# Patient Record
Sex: Male | Born: 1965
Health system: Southern US, Community
[De-identification: ages and names within clinical notes are randomized; demographics above are authoritative.]

## PROBLEM LIST (undated history)

## (undated) DIAGNOSIS — J45909 Unspecified asthma, uncomplicated: Secondary | ICD-10-CM

## (undated) DIAGNOSIS — K219 Gastro-esophageal reflux disease without esophagitis: Secondary | ICD-10-CM

## (undated) DIAGNOSIS — M419 Scoliosis, unspecified: Secondary | ICD-10-CM

## (undated) DIAGNOSIS — F319 Bipolar disorder, unspecified: Secondary | ICD-10-CM

## (undated) DIAGNOSIS — R7303 Prediabetes: Secondary | ICD-10-CM

## (undated) DIAGNOSIS — J449 Chronic obstructive pulmonary disease, unspecified: Secondary | ICD-10-CM

## (undated) DIAGNOSIS — A0472 Enterocolitis due to Clostridium difficile, not specified as recurrent: Secondary | ICD-10-CM

## (undated) DIAGNOSIS — J849 Interstitial pulmonary disease, unspecified: Secondary | ICD-10-CM

## (undated) DIAGNOSIS — J302 Other seasonal allergic rhinitis: Secondary | ICD-10-CM

## (undated) DIAGNOSIS — G4730 Sleep apnea, unspecified: Secondary | ICD-10-CM

## (undated) HISTORY — DX: Gastro-esophageal reflux disease without esophagitis: K21.9

## (undated) HISTORY — DX: Unspecified asthma, uncomplicated: J45.909

## (undated) HISTORY — DX: Enterocolitis due to Clostridium difficile, not specified as recurrent: A04.72

## (undated) HISTORY — PX: LUNG TRANSPLANT, DOUBLE: SHX704

## (undated) HISTORY — DX: Other seasonal allergic rhinitis: J30.2

## (undated) HISTORY — DX: Scoliosis, unspecified: M41.9

## (undated) HISTORY — DX: Chronic obstructive pulmonary disease, unspecified: J44.9

## (undated) HISTORY — DX: Sleep apnea, unspecified: G47.30

## (undated) HISTORY — PX: BACK SURGERY: SHX140

## (undated) HISTORY — DX: Interstitial pulmonary disease, unspecified: J84.9

## (undated) HISTORY — PX: LUNG SURGERY: SHX703

## (undated) HISTORY — PX: OTHER SURGICAL HISTORY: SHX169

## (undated) HISTORY — DX: Bipolar disorder, unspecified: F31.9

---

## 2013-01-06 ENCOUNTER — Other Ambulatory Visit: Payer: Self-pay | Admitting: Gastroenterology

## 2013-01-06 LAB — CLOSTRIDIUM DIFFICILE BY PCR

## 2013-01-08 ENCOUNTER — Ambulatory Visit: Payer: Self-pay | Admitting: Gastroenterology

## 2013-10-17 ENCOUNTER — Emergency Department: Payer: Self-pay | Admitting: Emergency Medicine

## 2013-10-17 LAB — COMPREHENSIVE METABOLIC PANEL
ALT: 36 U/L (ref 12–78)
Albumin: 3.7 g/dL (ref 3.4–5.0)
Alkaline Phosphatase: 92 U/L
Anion Gap: 7 (ref 7–16)
BUN: 10 mg/dL (ref 7–18)
Bilirubin,Total: 0.3 mg/dL (ref 0.2–1.0)
CALCIUM: 8.6 mg/dL (ref 8.5–10.1)
CHLORIDE: 107 mmol/L (ref 98–107)
CREATININE: 0.86 mg/dL (ref 0.60–1.30)
Co2: 26 mmol/L (ref 21–32)
EGFR (African American): >60
EGFR (Non-African Amer.): >60
Glucose: 124 mg/dL — ABNORMAL HIGH (ref 65–99)
OSMOLALITY: 280 (ref 275–301)
POTASSIUM: 4.2 mmol/L (ref 3.5–5.1)
SGOT(AST): 25 U/L (ref 15–37)
Sodium: 140 mmol/L (ref 136–145)
Total Protein: 7.6 g/dL (ref 6.4–8.2)

## 2013-10-17 LAB — CBC
HCT: 42.6 % (ref 40.0–52.0)
HGB: 14.2 g/dL (ref 13.0–18.0)
MCH: 30.9 pg (ref 26.0–34.0)
MCHC: 33.2 g/dL (ref 32.0–36.0)
MCV: 93 fL (ref 80–100)
Platelet: 191 10*3/uL (ref 150–440)
RBC: 4.58 10*6/uL (ref 4.40–5.90)
RDW: 13.2 % (ref 11.5–14.5)
WBC: 6 10*3/uL (ref 3.8–10.6)

## 2013-10-17 LAB — URINALYSIS, COMPLETE
BACTERIA: NONE SEEN
Bilirubin,UR: NEGATIVE
Blood: NEGATIVE
Glucose,UR: NEGATIVE mg/dL (ref 0–75)
Ketone: NEGATIVE
Leukocyte Esterase: NEGATIVE
Nitrite: NEGATIVE
PH: 5 (ref 4.5–8.0)
Protein: NEGATIVE
RBC,UR: 1 /HPF (ref 0–5)
Specific Gravity: 1.027 (ref 1.003–1.030)
Squamous Epithelial: NONE SEEN

## 2013-10-17 LAB — CK TOTAL AND CKMB (NOT AT ARMC)
CK, TOTAL: 192 U/L
CK-MB: 2.1 ng/mL (ref 0.5–3.6)

## 2013-10-17 LAB — TROPONIN I: Troponin-I: <0.02 ng/mL

## 2013-10-21 ENCOUNTER — Ambulatory Visit: Payer: Self-pay | Admitting: Cardiology

## 2013-11-05 ENCOUNTER — Ambulatory Visit: Payer: Self-pay | Admitting: Specialist

## 2013-11-09 ENCOUNTER — Ambulatory Visit: Payer: Self-pay | Admitting: Specialist

## 2013-12-17 ENCOUNTER — Ambulatory Visit: Payer: Self-pay | Admitting: Cardiothoracic Surgery

## 2013-12-17 LAB — COMPREHENSIVE METABOLIC PANEL
ALK PHOS: 97 U/L
ALT: 51 U/L
Albumin: 4 g/dL (ref 3.4–5.0)
Anion Gap: 7 (ref 7–16)
BUN: 14 mg/dL (ref 7–18)
Bilirubin,Total: 0.3 mg/dL (ref 0.2–1.0)
CO2: 30 mmol/L (ref 21–32)
Calcium, Total: 9.3 mg/dL (ref 8.5–10.1)
Chloride: 103 mmol/L (ref 98–107)
Creatinine: 0.94 mg/dL (ref 0.60–1.30)
EGFR (African American): >60
GLUCOSE: 117 mg/dL — AB (ref 65–99)
OSMOLALITY: 281 (ref 275–301)
Potassium: 4.7 mmol/L (ref 3.5–5.1)
SGOT(AST): 28 U/L (ref 15–37)
Sodium: 140 mmol/L (ref 136–145)
TOTAL PROTEIN: 7.9 g/dL (ref 6.4–8.2)

## 2013-12-17 LAB — CBC CANCER CENTER
Basophil #: 0 x10 3/mm (ref 0.0–0.1)
Basophil %: 0.8 %
EOS ABS: 0.1 x10 3/mm (ref 0.0–0.7)
Eosinophil %: 1.9 %
HCT: 42.6 % (ref 40.0–52.0)
HGB: 14.2 g/dL (ref 13.0–18.0)
LYMPHS PCT: 38.3 %
Lymphocyte #: 2 x10 3/mm (ref 1.0–3.6)
MCH: 31 pg (ref 26.0–34.0)
MCHC: 33.4 g/dL (ref 32.0–36.0)
MCV: 93 fL (ref 80–100)
Monocyte #: 0.4 x10 3/mm (ref 0.2–1.0)
Monocyte %: 6.9 %
NEUTROS ABS: 2.7 x10 3/mm (ref 1.4–6.5)
NEUTROS PCT: 52.1 %
Platelet: 203 x10 3/mm (ref 150–440)
RBC: 4.6 10*6/uL (ref 4.40–5.90)
RDW: 12.9 % (ref 11.5–14.5)
WBC: 5.2 x10 3/mm (ref 3.8–10.6)

## 2013-12-17 LAB — PROTIME-INR
INR: 1
Prothrombin Time: 12.6 secs (ref 11.5–14.7)

## 2013-12-17 LAB — APTT: ACTIVATED PTT: 30.3 s (ref 23.6–35.9)

## 2013-12-21 ENCOUNTER — Ambulatory Visit: Payer: Self-pay

## 2013-12-28 ENCOUNTER — Inpatient Hospital Stay: Payer: Self-pay

## 2013-12-28 LAB — DRUG SCREEN, URINE

## 2013-12-29 LAB — CBC WITH DIFFERENTIAL/PLATELET
Basophil #: 0 10*3/uL (ref 0.0–0.1)
Basophil %: 0.2 %
Eosinophil #: 0 10*3/uL (ref 0.0–0.7)
Eosinophil %: 0.1 %
HCT: 40.8 % (ref 40.0–52.0)
HGB: 13.5 g/dL (ref 13.0–18.0)
Lymphocyte #: 1.6 10*3/uL (ref 1.0–3.6)
Lymphocyte %: 15.2 %
MCH: 30.9 pg (ref 26.0–34.0)
MCHC: 33.1 g/dL (ref 32.0–36.0)
MCV: 93 fL (ref 80–100)
Monocyte #: 0.9 x10 3/mm (ref 0.2–1.0)
Monocyte %: 8.6 %
NEUTROS ABS: 8 10*3/uL — AB (ref 1.4–6.5)
Neutrophil %: 75.9 %
PLATELETS: 199 10*3/uL (ref 150–440)
RBC: 4.37 10*6/uL — ABNORMAL LOW (ref 4.40–5.90)
RDW: 13.3 % (ref 11.5–14.5)
WBC: 10.6 10*3/uL (ref 3.8–10.6)

## 2013-12-29 LAB — BASIC METABOLIC PANEL
ANION GAP: 5 — AB (ref 7–16)
BUN: 10 mg/dL (ref 7–18)
CHLORIDE: 106 mmol/L (ref 98–107)
CO2: 29 mmol/L (ref 21–32)
CREATININE: 0.69 mg/dL (ref 0.60–1.30)
Calcium, Total: 8 mg/dL — ABNORMAL LOW (ref 8.5–10.1)
EGFR (Non-African Amer.): >60
GLUCOSE: 121 mg/dL — AB (ref 65–99)
Osmolality: 280 (ref 275–301)
POTASSIUM: 4.1 mmol/L (ref 3.5–5.1)
Sodium: 140 mmol/L (ref 136–145)

## 2013-12-31 ENCOUNTER — Ambulatory Visit: Payer: Self-pay | Admitting: Cardiothoracic Surgery

## 2013-12-31 LAB — PATHOLOGY REPORT

## 2014-01-31 ENCOUNTER — Ambulatory Visit: Payer: Self-pay | Admitting: Cardiothoracic Surgery

## 2014-02-17 ENCOUNTER — Encounter: Payer: Self-pay | Admitting: Pulmonary Disease

## 2014-02-17 ENCOUNTER — Ambulatory Visit (INDEPENDENT_AMBULATORY_CARE_PROVIDER_SITE_OTHER): Payer: BC Managed Care – PPO | Admitting: Pulmonary Disease

## 2014-02-17 ENCOUNTER — Encounter (INDEPENDENT_AMBULATORY_CARE_PROVIDER_SITE_OTHER): Payer: Self-pay

## 2014-02-17 VITALS — BP 134/82 | HR 80 | Ht 73.0 in | Wt 291.0 lb

## 2014-02-17 DIAGNOSIS — J84112 Idiopathic pulmonary fibrosis: Secondary | ICD-10-CM

## 2014-02-17 DIAGNOSIS — J8410 Pulmonary fibrosis, unspecified: Secondary | ICD-10-CM

## 2014-02-17 LAB — RHEUMATOID FACTOR: Rhuematoid fact SerPl-aCnc: <10 IU/mL (ref <=14)

## 2014-02-17 NOTE — Progress Notes (Signed)
   Subjective:    Patient ID: Kevin Boyd, male    DOB: Aug 23, 1965, 48 y.o.   MRN: [id number]  HPI    Review of Systems  Constitutional: Negative for fever and unexpected weight change.  HENT: Positive for postnasal drip and sinus pressure. Negative for congestion, dental problem, ear pain, nosebleeds, rhinorrhea, sneezing, sore throat and trouble swallowing.   Eyes: Negative for redness and itching.  Respiratory: Positive for shortness of breath. Negative for cough, chest tightness and wheezing.   Cardiovascular: Negative for palpitations and leg swelling.  Gastrointestinal: Negative for nausea and vomiting.  Genitourinary: Negative for dysuria.  Musculoskeletal: Negative for joint swelling.  Skin: Negative for rash.  Neurological: Negative for headaches.  Hematological: Does not bruise/bleed easily.  Psychiatric/Behavioral: Negative for dysphoric mood. The patient is not nervous/anxious.        Objective:   Physical Exam        Assessment & Plan:

## 2014-02-17 NOTE — Patient Instructions (Signed)
We will see you back in 2-3 weeks to go over the lab work I will call [facility] to discuss the familial lung fibrosis study

## 2014-02-17 NOTE — Progress Notes (Signed)
Subjective:    Patient ID: Kevin Boyd, male    DOB: 11-19-1965, 48 y.o.   MRN: [id number]  HPI  Kevin Boyd is here to see me because he was recently diagnosed with interstitial fibrosis.  He underwent a biopsy this year and was found to have a UIP.   He says that he had an ED visit a few months back one night when he felt like he was short of breath and he felt like he was having a heart attack.  He had an abnormal chest X-ray and he followed up with cardiology.  He had a heart catheterization that was normal.  He was then referred to pulmonary at [facility] because of the abnormal CXR.  He had a CT chest that showed pulmonary fibrosis and a nodule.  The nodule grew in the period of just one month and he underwent an open lung biopsy that showed no malignancy but UIP.  He had been experiencing several weeks to months of chest pain prior to his original presentation to the ER that night.  He had been experiencing intermittent arm numbness and an abnormal feeling in the chest.  He has been experiencing some dyspnea since 9/28, only occasionally prior to that.  He said that the dyspnea never bothered him prior to the surgery, but unfortunately since surgery he has been getting more dyspnic.  He does not cough.  He does clear his throat a lot because of phlegm in the back of his throat.  He does not produces mucus from his lungs regularly. He does experiencing dyspnea when climbing a hill or up a flight of stairs.  He has gained about 120 pounds in the last 17 years.     He was told years ago that he had asthma and COPD about 9 years ago after a case of bad pneumonia.  He was told at that time that he had fibrosis.   He has had lung function testing and a 6 min walk at the [facility].    His mother and his grandmother (mother's side).  His mother died in 2003 and was cared [facility]. She was told that he had a genetic problem.   He does not have problems swallowing.  No  fevers, no joint swelling, no rash. He has dry mouth all the time and sometimes has dry eyes.  He has never been told that he had a connective tissue disease.  He was adopted.  He knows that his mother had Sjogren's disease.    He has CPAP but he doesn't use it regularly every night.  He smoked 2 ppd for 30 years, quit in 2015.  He works in [employer] and has for years. 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  There is a wood stove in the house.    Past Medical History  Diagnosis Date  . Sleep apnea   . Interstitial lung disease   . Bipolar 1 disorder   . GERD (gastroesophageal reflux disease)   . Seasonal allergies   . Scoliosis   . COPD (chronic obstructive pulmonary disease)   . Asthma   . C. difficile diarrhea      Family History  Problem Relation Age of Onset  . Pulmonary fibrosis Mother   . Sjogren's syndrome Mother   . Pulmonary fibrosis Maternal Grandmother      History   Social History  . Marital Status: Single    Spouse Name: N/A  Number of Children: N/A  . Years of Education: N/A   Occupational History  . Not on file.   Social History Main Topics  . Smoking status: Former Smoker -- 2.00 packs/day for 30 years    Types: Cigarettes    Quit date: 12/28/2013  . Smokeless tobacco: Former [employer]  . Alcohol Use: Not on file  . Drug Use: Not on file  . Sexual Activity: Not on file   Other Topics Concern  . Not on file   Social History Narrative  . No narrative on file     No Known Allergies   No outpatient prescriptions prior to visit.   No facility-administered medications prior to visit.      Review of Systems  Constitutional: Negative for fever, chills, activity change and appetite change.  HENT: Negative for congestion, ear pain, hearing loss, postnasal drip, rhinorrhea, sinus pressure and sneezing.        Dry mouth  Eyes: Negative for redness, itching and visual disturbance.       Dry eyes  Respiratory: Positive  for cough and shortness of breath. Negative for chest tightness and wheezing.   Cardiovascular: Negative for chest pain, palpitations and leg swelling.  Gastrointestinal: Negative for nausea, vomiting, abdominal pain, diarrhea, constipation, blood in stool and abdominal distention.  Musculoskeletal: Negative for myalgias, joint swelling, arthralgias, gait problem, neck pain and neck stiffness.  Skin: Negative for rash.  Neurological: Negative for dizziness, light-headedness, numbness and headaches.  Hematological: Does not bruise/bleed easily.  Psychiatric/Behavioral: Negative for confusion and dysphoric mood.       Objective:   Physical Exam Filed Vitals:   02/17/14 1512  BP: 134/82  Pulse: 80  Height: 6\' 1"  (1.854 m)  Weight: 291 lb (131.997 kg)  SpO2: 96%  RA  Gen: well appearing, no acute distress HEENT: NCAT, PERRL, EOMi, OP clear, neck supple without masses PULM: few crackles left base CV: RRR, no mgr, no JVD AB: BS+, soft, nontender, no hsm Ext: warm, no edema, no clubbing, no cyanosis Derm: no rash or skin breakdown Neuro: A&Ox4, CN II-XII intact, strength 5/5 in all 4 extremities  August 2015 CT chest> There is groundglass opacification, scattered intralobular septal thickening, traction bronchiectasis and peripheral based honeycombing. The majority of the interstitial changes are peripheral based. There does not appear to be a craniocaudal gradient  12/28/2013 open lung biopsy: pulmonary fibrosis, severe, 1 sections show honeycombing, fibroblastic foci and overall variegated appearance with subpleural accentuation fibrosis, most consistent with usual interstitial pneumonitis. Granulomas are not seen.     Assessment & Plan:   UIP (usual interstitial pneumonitis) Kevin Boyd has a CT chest which is consistent with UIP and his open lung biopsy was also consistent with that. UIP can be seen with a variety of conditions including idiopathic pulmonary fibrosis, familial  pulmonary fibrosis, and connective tissue disease.  Interestingly he has a lengthy family history of women and his family who have died early from pulmonary fibrosis. His [facility] and died there in 2003. His grandmother (maternal) also died early of pulmonary fibrosis. So I am concerned that this is a familial pulmonary fibrosis case.  Apparently his mother had a history of Sjogren's disease and considering the fact that [name] has dry eyes and dry mouth raises my concern for this possibility.  I explained to him that I am reluctant to treat this condition as IPF at this point for a few reasons: #1 he has known about this for  least 9 years which is very atypical for IPF. #2 he may have an underlying connective tissue disease in which case we would treat UIP differently than IPF.   Plan: -obtain serologic workup for connective tissue diseases -I will contact my colleagues at the [facility] interstitial lung disease clinic to see if they have any protocols ongoing for familial usual interstitial pneumonitis. -Followup 2-3 weeks to discuss whether or not he should be chewed with antibiotic therapy    Updated Medication List Outpatient Encounter Prescriptions as of 02/17/2014  Medication Sig  . albuterol (PROVENTIL HFA;VENTOLIN HFA) 108 (90 BASE) MCG/ACT inhaler Inhale 2 puffs into the lungs every 6 (six) hours as needed for wheezing or shortness of breath.  Marland Kitchen aspirin EC 81 MG tablet Take 81 mg by mouth daily.  Marland Kitchen Bioflavonoid Products (ESTER C PO) Take 1 tablet by mouth daily.  . carbamazepine (TEGRETOL XR) 100 MG 12 hr tablet Take 100 mg by mouth 2 (two) times daily. 1 tab qam, 2 tabs qhs  . cetirizine (ZYRTEC) 10 MG tablet Take 10 mg by mouth daily.  . Cholecalciferol (VITAMIN D-3) 5000 UNITS TABS Take 1 tablet by mouth daily.  . clonazePAM (KLONOPIN) 0.5 MG tablet Take 0.5 mg by mouth 2 (two) times daily as needed for anxiety.  Marland Kitchen esomeprazole (NEXIUM) 40 MG  capsule Take 40 mg by mouth daily at 12 noon.  . ferrous gluconate (FERGON) 324 MG tablet Take 324 mg by mouth daily with breakfast.  . Fluticasone-Salmeterol (ADVAIR) 500-50 MCG/DOSE AEPB Inhale 1 puff into the lungs 2 (two) times daily.  Marland Kitchen lamoTRIgine (LAMICTAL) 200 MG tablet Take 200 mg by mouth 2 (two) times daily.  . Probiotic Product (PROBIOTIC DAILY PO) Take 1 tablet by mouth daily.

## 2014-02-17 NOTE — Assessment & Plan Note (Signed)
Mr. Kevin Boyd has a CT chest which is consistent with UIP and his open lung biopsy was also consistent with that. UIP can be seen with a variety of conditions including idiopathic pulmonary fibrosis, familial pulmonary fibrosis, and connective tissue disease.  Interestingly he has a lengthy family history of women and his family who have died early from pulmonary fibrosis. His [facility] and died there in 2003. His grandmother (maternal) also died early of pulmonary fibrosis. So I am concerned that this is a familial pulmonary fibrosis case.  Apparently his mother had a history of Sjogren's disease and considering the fact that Kevin Boyd has dry eyes and dry mouth raises my concern for this possibility.  I explained to him that I am reluctant to treat this condition as IPF at this point for a few reasons: #1 he has known about this for least 9 years which is very atypical for IPF. #2 he may have an underlying connective tissue disease in which case we would treat UIP differently than IPF.   Plan: -obtain serologic workup for connective tissue diseases -I will contact my colleagues at the [facility] interstitial lung disease clinic to see if they have any protocols ongoing for familial usual interstitial pneumonitis. -Followup 2-3 weeks to discuss whether or not he should be chewed with antibiotic therapy

## 2014-02-18 LAB — SJOGRENS SYNDROME-B EXTRACTABLE NUCLEAR ANTIBODY: SSB (La) (ENA) Antibody, IgG: <1

## 2014-02-18 LAB — ANTI-JO 1 ANTIBODY, IGG: Anti JO-1: <0.2 AI (ref 0.0–0.9)

## 2014-02-18 LAB — ANTI-SCLERODERMA ANTIBODY: SCLERODERMA (SCL-70) (ENA) ANTIBODY, IGG: NEGATIVE

## 2014-02-18 LAB — ANA: ANA: NEGATIVE

## 2014-02-18 LAB — SJOGRENS SYNDROME-A EXTRACTABLE NUCLEAR ANTIBODY
SSA (RO) (ENA) ANTIBODY, IGG: NEGATIVE
SSA (Ro) (ENA) Antibody, IgG: <1

## 2014-02-18 LAB — CENTROMERE ANTIBODIES: Centromere Ab Screen: <1

## 2014-02-18 LAB — SEDIMENTATION RATE: Sed Rate: 44 mm/hr — ABNORMAL HIGH (ref 0–22)

## 2014-02-18 LAB — C-REACTIVE PROTEIN: CRP: 2.6 mg/dL (ref 0.5–20.0)

## 2014-02-19 LAB — ALDOLASE: Aldolase: 7.8 U/L (ref <=8.1)

## 2014-02-22 ENCOUNTER — Telehealth: Payer: Self-pay | Admitting: Pulmonary Disease

## 2014-02-22 NOTE — Telephone Encounter (Signed)
[facility] 4 pages to Dr. Kendrick FriesMcQuaid

## 2014-02-22 NOTE — Progress Notes (Signed)
Quick Note:  Pt aware of results and recs ______ 

## 2014-02-23 LAB — HYPERSENSITIVITY PNUEMONITIS PROFILE

## 2014-03-02 ENCOUNTER — Ambulatory Visit: Payer: Self-pay | Admitting: Cardiothoracic Surgery

## 2014-03-04 ENCOUNTER — Encounter: Payer: Self-pay | Admitting: Pulmonary Disease

## 2014-03-04 ENCOUNTER — Ambulatory Visit (INDEPENDENT_AMBULATORY_CARE_PROVIDER_SITE_OTHER): Payer: BC Managed Care – PPO | Admitting: Pulmonary Disease

## 2014-03-04 ENCOUNTER — Other Ambulatory Visit (INDEPENDENT_AMBULATORY_CARE_PROVIDER_SITE_OTHER): Payer: BC Managed Care – PPO

## 2014-03-04 VITALS — BP 148/86 | HR 78 | Ht 73.0 in | Wt 265.0 lb

## 2014-03-04 DIAGNOSIS — J84112 Idiopathic pulmonary fibrosis: Secondary | ICD-10-CM

## 2014-03-04 DIAGNOSIS — R0602 Shortness of breath: Secondary | ICD-10-CM

## 2014-03-04 DIAGNOSIS — R911 Solitary pulmonary nodule: Secondary | ICD-10-CM

## 2014-03-04 LAB — HEPATIC FUNCTION PANEL
ALT: 36 U/L (ref 0–53)
AST: 30 U/L (ref 0–37)
Albumin: 4.3 g/dL (ref 3.5–5.2)
Alkaline Phosphatase: 92 U/L (ref 39–117)
Bilirubin, Direct: 0.1 mg/dL (ref 0.0–0.3)
Total Bilirubin: 0.5 mg/dL (ref 0.2–1.2)
Total Protein: 7.8 g/dL (ref 6.0–8.3)

## 2014-03-04 NOTE — Assessment & Plan Note (Signed)
This was biopsied by Dr. Thelma Bargeaks and was found to be benign. Follow-up with Dr. Thelma Bargeaks.

## 2014-03-04 NOTE — Assessment & Plan Note (Addendum)
Based on his biopsy result which showed usual interstitial pneumonitis, and the fact that he has no underlying connective tissue disease, Mr. Kevin Boyd has idiopathic pulmonary fibrosis. I explained to him today that unfortunately there is no cure for this condition. We talked about the benefits of using anti-fibrotic therapy with either Esbriet or Ofev.   I explained to him the side effects of the shins and the fact that they only slow the progression of the disease they do not cure it.    I also explained to him that our center participates in multiple clinical trials for idiopathic pulmonary fibrosis. He is interested in participating in clinical trials for this condition. I also explained to him that in the long-term he may ultimately need a lung transplant considering his young age. I told him that the best thing he can do to help himself at this point is to exercise regularly and try to lose as much weight as possible.  He may be interested in going to [facility] interstitial lung disease clinic so that he can participate in an observational study of patients with familial idiopathic pulmonary fibrosis.  Plan: -Pulmonary rehabilitation referral -Start Esbriet -obtain PFT/6MW -[facility] the familial IPF study -consider clinical trial referral

## 2014-03-04 NOTE — Progress Notes (Signed)
Subjective:    Patient ID: Kevin Boyd, male    DOB: April 29, 1965, 48 y.o.   MRN: [id number]  Synopsis: First evaluated by [facility] in 2015 for usual interstitial pneumonitis found on a biopsy. He has a family history significant for pulmonary fibrosis in that his mother died and he has a brother with the disease. Serology panel in 2015 was negative for connective tissue disease.  Diagnosed with IPF  HPI Chief Complaint  Patient presents with  . Follow-up    Pt c/o sob with exertion, mostly nonprod cough.  Review labs.     03/04/2014 ROV > Kevin Boyd says that he has noticed that he has a hard time catching his breath when he is carrying objects at work.  He definitely feels more short or breath. He has been coughing up mucus, sometime clear mucus.  No blood, no chest pain aside from the nerve pain.  He is not smoking.  His weight has been stable.  He is not exercising regulalry.   Past Medical History  Diagnosis Date  . Sleep apnea   . Interstitial lung disease   . Bipolar 1 disorder   . GERD (gastroesophageal reflux disease)   . Seasonal allergies   . Scoliosis   . COPD (chronic obstructive pulmonary disease)   . Asthma   . C. difficile diarrhea       Review of Systems  Constitutional: Negative for fever and chills.  HENT: Negative for postnasal drip, rhinorrhea and sinus pressure.   Respiratory: Positive for cough and shortness of breath. Negative for wheezing.   Cardiovascular: Negative for chest pain, palpitations and leg swelling.       Objective:   Physical Exam Filed Vitals:   03/04/14 1347  BP: 148/86  Pulse: 78  Height: 6' 1"  (1.854 m)  Weight: 265 lb (120.203 kg)  SpO2: 97%  RA  Gen: well appearing, no acute distress HEENT: NCAT, EOMi, OP clear,  PULM: CTA B CV: RRR, no mgr, no JVD AB: BS+, soft, nontender,  Ext: warm, no edema, no clubbing, no cyanosis Derm: no rash or skin breakdown Neuro: A&Ox4, CN II-XII intact,   Records from [place] were  reviewed, he had a chest x-ray in 2004 which showed pneumonia, however these findings resolved within one month on a follow-up x-ray Serology was sent in November 2015> ANA, rheumatoid factor, CCP, anti-SCL 70, SSA/SSB, anti-Jo-1, aldolase all negative, ESR slightly elevated at 44 11/09/2013 CT chest images reviewed again today  August 2015 CT chest images reviewed> Consistent with UIP; There is groundglass opacification, scattered intralobular septal thickening, traction bronchiectasis and peripheral based honeycombing. The majority of the interstitial changes are peripheral based. There does not appear to be a craniocaudal gradient; 63m nodule RLL  12/28/2013 open lung biopsy: pulmonary fibrosis, severe, 1 sections show honeycombing, fibroblastic foci and overall variegated appearance with subpleural accentuation fibrosis, most consistent with usual interstitial pneumonitis. Granulomas are not seen.      Assessment & Plan:   IPF (idiopathic pulmonary fibrosis) Based on his biopsy result which showed usual interstitial pneumonitis, and the fact that he has no underlying connective tissue disease, Mr. HEckardthas idiopathic pulmonary fibrosis. I explained to him today that unfortunately there is no cure for this condition. We talked about the benefits of using anti-fibrotic therapy with either Esbriet or Ofev.   I explained to him the side effects of the shins and the fact that they only slow the progression of the disease they do not cure it.  I also explained to him that our center participates in multiple clinical trials for idiopathic pulmonary fibrosis. He is interested in participating in clinical trials for this condition. I also explained to him that in the long-term he may ultimately need a lung transplant considering his young age. I told him that the best thing he can do to help himself at this point is to exercise regularly and try to lose as much weight as possible.  He may be  interested in going to [facility] interstitial lung disease clinic so that he can participate in an observational study of patients with familial idiopathic pulmonary fibrosis.  Plan: -Pulmonary rehabilitation referral -Start Esbriet -obtain PFT/6MW -He will consider evaluation at [facility] for the familial IPF study -consider clinical trial referral  Shortness of breath This problem is multifactorial and related to his idiopathic pulmonary fibrosis, obesity, and deconditioning. I think he is also more deconditioned than normal after his recent surgery.  Plan: -Pulmonary rehabilitation referral -Weight loss advised  Solitary pulmonary nodule This was biopsied by Dr. Genevive Bi and was found to be benign. Follow-up with Dr. Genevive Bi.    Updated Medication List Outpatient Encounter Prescriptions as of 03/04/2014  Medication Sig  . albuterol (PROVENTIL HFA;VENTOLIN HFA) 108 (90 BASE) MCG/ACT inhaler Inhale 2 puffs into the lungs every 6 (six) hours as needed for wheezing or shortness of breath.  Marland Kitchen aspirin EC 81 MG tablet Take 81 mg by mouth daily.  Marland Kitchen Bioflavonoid Products (ESTER C PO) Take 1 tablet by mouth daily.  . carbamazepine (TEGRETOL XR) 100 MG 12 hr tablet Take 100 mg by mouth 2 (two) times daily. 1 tab qam, 2 tabs qhs  . cetirizine (ZYRTEC) 10 MG tablet Take 10 mg by mouth daily.  . Cholecalciferol (VITAMIN D-3) 5000 UNITS TABS Take 1 tablet by mouth daily.  . clonazePAM (KLONOPIN) 0.5 MG tablet Take 0.5 mg by mouth 2 (two) times daily as needed for anxiety.  Marland Kitchen esomeprazole (NEXIUM) 40 MG capsule Take 40 mg by mouth daily at 12 noon.  . ferrous gluconate (FERGON) 324 MG tablet Take 324 mg by mouth daily with breakfast.  . Fluticasone-Salmeterol (ADVAIR) 500-50 MCG/DOSE AEPB Inhale 1 puff into the lungs 2 (two) times daily.  Marland Kitchen lamoTRIgine (LAMICTAL) 200 MG tablet Take 200 mg by mouth 2 (two) times daily.  . Probiotic Product (PROBIOTIC DAILY PO) Take 1 tablet by mouth daily.

## 2014-03-04 NOTE — Patient Instructions (Signed)
We will refer [facility] to [facility] pulmonary rehab We will fill out the application for Esbriet We will arrange a pulmonary funciton test at [facility] WE will see you back in 2 months or sooner if needed

## 2014-03-04 NOTE — Assessment & Plan Note (Signed)
This problem is multifactorial and related to his idiopathic pulmonary fibrosis, obesity, and deconditioning. I think he is also more deconditioned than normal after his recent surgery.  Plan: -Pulmonary rehabilitation referral -Weight loss advised

## 2014-03-15 ENCOUNTER — Institutional Professional Consult (permissible substitution): Payer: Self-pay | Admitting: Pulmonary Disease

## 2014-03-15 ENCOUNTER — Telehealth: Payer: Self-pay | Admitting: Pulmonary Disease

## 2014-03-15 NOTE — Progress Notes (Signed)
Quick Note:  lmtcb X1 ______ 

## 2014-03-15 NOTE — Telephone Encounter (Signed)
Results have been explained to patient, pt expressed understanding. Nothing further needed.  Notes Recorded by Lupita Leashouglas B McQuaid, MD on 03/15/2014 at 3:20 PM A, Please let him know that this was normal Thanks B  Nothing further needed.

## 2014-03-22 ENCOUNTER — Ambulatory Visit: Payer: Self-pay | Admitting: Pulmonary Disease

## 2014-03-22 LAB — PULMONARY FUNCTION TEST

## 2014-03-30 ENCOUNTER — Telehealth: Payer: Self-pay | Admitting: Pulmonary Disease

## 2014-03-30 NOTE — Telephone Encounter (Addendum)
Pt states that he dropped off patient assistance paperwork for Esbriet and has not heard anything regarding the status. Dropped them off at [facility] about 2 weeks ago -  Given to BQ nurse.   Pt states that he had his PFT done x 1 week ago and was wanting to know the results.   Please advise Ashley on Esbriet.  Please advise Dr McQuaid on PFT results.  Thanks.  

## 2014-03-30 NOTE — Telephone Encounter (Signed)
I've received esbriet forms from Kevin Boyd, filled out and only needing BQ's signature and recs on med titration before faxing to esbriet.  I also have pft results from [facility] in BQ's look-at folder.

## 2014-03-30 NOTE — Telephone Encounter (Signed)
Pt states that he dropped off patient assistance paperwork for Esbriet and has not heard anything regarding the status. Dropped them off at [facility] about 2 weeks ago -  Given to BQ nurse.   Pt states that he had his PFT done x 1 week ago and was wanting to know the results.   Please advise Morrie Sheldonshley on Esbriet.  Please advise Dr Kendrick FriesMcQuaid on PFT results.  Thanks.

## 2014-03-30 NOTE — Telephone Encounter (Signed)
Duplicate message--closed in error. See open message dated 03/30/14

## 2014-03-30 NOTE — Telephone Encounter (Signed)
Pt returning call.Kevin Boyd ° °

## 2014-03-30 NOTE — Telephone Encounter (Signed)
Pt wanted clarification on Esbriet process. Nothing further needed. Will send to [place] and GeorgianaMcQuaid to address.

## 2014-04-05 NOTE — Telephone Encounter (Signed)
Will sign after I pick up today

## 2014-04-06 ENCOUNTER — Ambulatory Visit (INDEPENDENT_AMBULATORY_CARE_PROVIDER_SITE_OTHER)
Admission: RE | Admit: 2014-04-06 | Discharge: 2014-04-06 | Disposition: A | Discharge status: Home / Self Care | Payer: BLUE CROSS/BLUE SHIELD | Source: Ambulatory Visit | Attending: Pulmonary Disease | Admitting: Pulmonary Disease

## 2014-04-06 ENCOUNTER — Ambulatory Visit (INDEPENDENT_AMBULATORY_CARE_PROVIDER_SITE_OTHER): Payer: BLUE CROSS/BLUE SHIELD | Admitting: Pulmonary Disease

## 2014-04-06 ENCOUNTER — Encounter: Payer: Self-pay | Admitting: Pulmonary Disease

## 2014-04-06 VITALS — BP 138/72 | HR 96 | Ht 73.0 in | Wt 290.0 lb

## 2014-04-06 DIAGNOSIS — J84112 Idiopathic pulmonary fibrosis: Secondary | ICD-10-CM

## 2014-04-06 DIAGNOSIS — R0602 Shortness of breath: Secondary | ICD-10-CM

## 2014-04-06 DIAGNOSIS — R079 Chest pain, unspecified: Secondary | ICD-10-CM | POA: Insufficient documentation

## 2014-04-06 MED ORDER — GABAPENTIN 100 MG PO CAPS: ORAL_CAPSULE | ORAL | Status: DC

## 2014-04-06 MED ORDER — DOXYCYCLINE HYCLATE 50 MG PO CAPS: 50.0000 mg | ORAL_CAPSULE | Freq: Two times a day (BID) | ORAL | Status: DC

## 2014-04-06 NOTE — Telephone Encounter (Signed)
Pt aware of results.  Nothing further needed.  

## 2014-04-06 NOTE — Assessment & Plan Note (Addendum)
He has pleuritic chest pain over the scar from surgery. Is very well healed and appearance and does not appear to show signs of infection. I think this is a neuropathic pain. It may in fact be causing him to splint somewhat and contributing to his shortness of breath.  Plan: -Start gabapentin to treat neuropathic pain -may consider nerve block if no improvement

## 2014-04-06 NOTE — Assessment & Plan Note (Signed)
As detailed above, I do worry that his pulmonary fibrosis may be progressing a bit more rapidly than expected. However, I need to rule out something like a pleural effusion or bronchitis which can also be contributing.  Plan: -Start Esbriet as soon as we have Therapist, occupationalinsurance approval, he was again educated today that this does not improve symptoms but only slows the progression -We will look for clinical trials for IPF in which she can participate -Again today I educated him that his primary goal in life at this point should be to exercise regularly and lose weight to make himself an ideal candidate for lung transplantation as this will certainly be needed in the future. At this point he does not need a lung transplant because he does not need oxygen therapy and his symptoms are not severe enough.

## 2014-04-06 NOTE — Progress Notes (Signed)
Subjective:    Patient ID: Kevin Boyd, male    DOB: [birth year], 49 y.o.   MRN: [id number]  Synopsis: First evaluated by [facility] in 2015 for usual interstitial pneumonitis found on a biopsy. He has a family history significant for pulmonary fibrosis in that his mother died and he has a brother with the disease. Serology panel in 2015 was negative for connective tissue disease.  Diagnosed with IPF.  09/2002 [place] CXR showed infiltrates transiently that cleared on a follow up film one month later 01/2014 ANA, RF, SCL-70, SSA/SSB, Aldolase, Anti-Jo-1, centromere all neg August 2015 CT chest> There is groundglass opacification, scattered intralobular septal thickening, traction bronchiectasis and peripheral based honeycombing. The majority of the interstitial changes are peripheral based. There does not appear to be a craniocaudal gradient 12/28/2013 open lung biopsy: pulmonary fibrosis, severe, 1 sections show honeycombing, fibroblastic foci and overall variegated appearance with subpleural accentuation fibrosis, most consistent with usual interstitial pneumonitis. Granulomas are not seen. 03/22/2014 PFT> Ratio 82%, FEV1 2.91L (74% pred, 8% pred), TLCO 4.67L (62% pred), DLCO 35.4 (45% pred)   HPI Chief Complaint  Patient presents with  . Acute Visit    pt c/o increased sob with exertion X1 month.  Pt has sometimes prod cough with green mucus, some sinus congestion.      [name] has been feeling a lot more short of breath since the last visit.  He says that for the last month he has had a steady decline in his dyspnea.  He says that specifically if he bends over he can't breathe, he can't carry anything of any weight that would normally be easy.  He still works despite this.  He has been having pain in his chest, and has been feeling liek there is mucus in his thraot.  He rarely produces white to green mucus, but this hasn't been a persistent problem.  Yesterday he had a low grade temp  with aches and pains throughout his body.  He says that the chest comes and goes, and has both a sharp pain which he attributes to his recent open lung biopsy. However in addition to this he has been experiencing a steady dull pressure in the center of his chest and to the right chest.  He also has a severe pain when he sneezes or picks up something heavy.  Two days ago he felt dizzy, he had some vomiting, and he felt like he was going to pass out.    He has been exercising on his elliptical and exercise bike 30 minutes daily, he has been losing weight.     Past Medical History  Diagnosis Date  . Sleep apnea   . Interstitial lung disease   . Bipolar 1 disorder   . GERD (gastroesophageal reflux disease)   . Seasonal allergies   . Scoliosis   . COPD (chronic obstructive pulmonary disease)   . Asthma   . C. difficile diarrhea       Review of Systems  Constitutional: Negative for fever and chills.  HENT: Negative for postnasal drip, rhinorrhea and sinus pressure.   Respiratory: Positive for cough and shortness of breath. Negative for wheezing.   Cardiovascular: Positive for chest pain. Negative for palpitations and leg swelling.       Objective:   Physical Exam Filed Vitals:   04/06/14 1455  BP: 138/72  Pulse: 96  Height: _0  (1.854 m)  Weight: 290 lb (131.543 kg)  SpO2: 95%  RA  Ambulated 500 feet  on RA and O2 saturation remained > 95%  Gen: well appearing, no acute distress HEENT: NCAT, EOMi, OP clear,  PULM: Crackles bilaterally in bases, few wheezes CHEST: surgical scar R chest well healed, no redness or swelling but tender to light touch CV: RRR, no mgr, no JVD AB: BS+, soft, nontender,  Ext: warm, no edema, no clubbing, no cyanosis Derm: no rash or skin breakdown Neuro: A&Ox4, CN II-XII intact,   Records from [place] were reviewed, he had a chest x-ray in 2004 which showed pneumonia, however these findings resolved within one month on a follow-up x-ray Serology  was sent in November 2015> ANA, rheumatoid factor, CCP, anti-SCL 70, SSA/SSB, anti-Jo-1, aldolase all negative, ESR slightly elevated at 44 11/09/2013 CT chest images reviewed again today  August 2015 CT chest images reviewed> Consistent with UIP; There is groundglass opacification, scattered intralobular septal thickening, traction bronchiectasis and peripheral based honeycombing. The majority of the interstitial changes are peripheral based. There does not appear to be a craniocaudal gradient; 53m nodule RLL  12/28/2013 open lung biopsy: pulmonary fibrosis, severe, 1 sections show honeycombing, fibroblastic foci and overall variegated appearance with subpleural accentuation fibrosis, most consistent with usual interstitial pneumonitis. Granulomas are not seen.      Assessment & Plan:   Shortness of breath RDmontehas been experiencing increasing shortness of breath for the last month. He does have some chest congestion and mucus production which may be consistent with bronchitis. His lung exam is unchanged today. I explained to him that the differential diagnosis of shortness of breath is broad but in his particular case I would consider an acute episode of bronchitis. Fortunately, he had a normal heart catheterization prior to his lung surgery so we know that his heart is fine.  Other possibilities for his shortness of breath include fluid collection (pleural effusion) somehow related to the recent surgery versus a more ominous finding which would be progression of his idiopathic pulmonary fibrosis. Considering the recency of the diagnosis and the lack of serial objective testing (pulmonary function testing in 6 minute walks), it may be difficult to prove that this is truly progressive pulmonary fibrosis.  Today we discussed the fact that idiopathic pulmonary fibrosis clearly does progress, but I think it would be a bit unusual for 2 progressed this quickly sought like to look for other causes.    Plan: -Obtain chest x-ray -Treat bronchitis with doxycycline -Start pulmonary rehabilitation for deconditioning -Follow-up one month   This plan was outlined to him and he was given opportunities to ask questions in our greater than 25 minute visit   IPF (idiopathic pulmonary fibrosis) As detailed above, I do worry that his pulmonary fibrosis may be progressing a bit more rapidly than expected. However, I need to rule out something like a pleural effusion or bronchitis which can also be contributing.  Plan: -Start Esbriet as soon as we have iBiochemist, clinical he was again educated today that this does not improve symptoms but only slows the progression -We will look for clinical trials for IPF in which she can participate -Again today I educated him that his primary goal in life at this point should be to exercise regularly and lose weight to make himself an ideal candidate for lung transplantation as this will certainly be needed in the future. At this point he does not need a lung transplant because he does not need oxygen therapy and his symptoms are not severe enough.  Chest pain He has pleuritic chest pain over  the scar from surgery. Is very well healed and appearance and does not appear to show signs of infection. I think this is a neuropathic pain. It may in fact be causing him to splint somewhat and contributing to his shortness of breath.  Plan: -Start gabapentin to treat neuropathic pain -may consider nerve block if no improvement    Updated Medication List Outpatient Encounter Prescriptions as of 04/06/2014  Medication Sig  . albuterol (PROVENTIL HFA;VENTOLIN HFA) 108 (90 BASE) MCG/ACT inhaler Inhale 2 puffs into the lungs every 6 (six) hours as needed for wheezing or shortness of breath.  Marland Kitchen aspirin EC 81 MG tablet Take 81 mg by mouth daily.  Marland Kitchen Bioflavonoid Products (ESTER C PO) Take 1 tablet by mouth daily.  . carbamazepine (TEGRETOL XR) 100 MG 12 hr tablet Take 100 mg  by mouth 2 (two) times daily. 1 tab qam, 2 tabs qhs  . cetirizine (ZYRTEC) 10 MG tablet Take 10 mg by mouth daily.  . Cholecalciferol (VITAMIN D-3) 5000 UNITS TABS Take 1 tablet by mouth daily.  . clonazePAM (KLONOPIN) 0.5 MG tablet Take 0.5 mg by mouth 2 (two) times daily as needed for anxiety.  Marland Kitchen esomeprazole (NEXIUM) 40 MG capsule Take 40 mg by mouth daily at 12 noon.  . ferrous gluconate (FERGON) 324 MG tablet Take 324 mg by mouth daily with breakfast.  . Fluticasone-Salmeterol (ADVAIR) 500-50 MCG/DOSE AEPB Inhale 1 puff into the lungs 2 (two) times daily.  Marland Kitchen lamoTRIgine (LAMICTAL) 200 MG tablet Take 200 mg by mouth 2 (two) times daily.  . Probiotic Product (PROBIOTIC DAILY PO) Take 1 tablet by mouth daily.  Marland Kitchen doxycycline (VIBRAMYCIN) 50 MG capsule Take 1 capsule (50 mg total) by mouth 2 (two) times daily.  Marland Kitchen gabapentin (NEURONTIN) 100 MG capsule 128m tid for 1 week, 200 mg tid for 1 week, 300 mg tid for 1 week then 400 mg until next office visit.

## 2014-04-06 NOTE — Patient Instructions (Addendum)
Take the doxycycline with a probiotic like yogurt for one week Take the gabapentin: 100mg  three times a day for a week, then 200mg  three times a day for a week, then 300mg  three times a day for a week, then 400mg  three times a day until you see me next We will call you with the results of your chest x-ray and blood work We will contact our research coordinators for clinical trials for IPF Start the Esbriet as directed once it is approved for you We will see you back in 6 weeks

## 2014-04-06 NOTE — Telephone Encounter (Signed)
-----   Message from Lupita Leashouglas B McQuaid, MD sent at 04/05/2014  7:37 PM EST ----- A, Please let him know that his PFTs showed fibrosis but nothing else. Thanks B

## 2014-04-06 NOTE — Assessment & Plan Note (Signed)
Kevin Boyd has been experiencing increasing shortness of breath for the last month. He does have some chest congestion and mucus production which may be consistent with bronchitis. His lung exam is unchanged today. I explained to him that the differential diagnosis of shortness of breath is broad but in his particular case I would consider an acute episode of bronchitis. Fortunately, he had a normal heart catheterization prior to his lung surgery so we know that his heart is fine.  Other possibilities for his shortness of breath include fluid collection (pleural effusion) somehow related to the recent surgery versus a more ominous finding which would be progression of his idiopathic pulmonary fibrosis. Considering the recency of the diagnosis and the lack of serial objective testing (pulmonary function testing in 6 minute walks), it may be difficult to prove that this is truly progressive pulmonary fibrosis.  Today we discussed the fact that idiopathic pulmonary fibrosis clearly does progress, but I think it would be a bit unusual for 2 progressed this quickly sought like to look for other causes.   Plan: -Obtain chest x-ray -Treat bronchitis with doxycycline -Start pulmonary rehabilitation for deconditioning -Follow-up one month   This plan was outlined to him and he was given opportunities to ask questions in our greater than 25 minute visit

## 2014-04-08 NOTE — Progress Notes (Signed)
Quick Note:  atc pt, brother answered and said to call back. wcb ______

## 2014-04-09 ENCOUNTER — Telehealth: Payer: Self-pay | Admitting: Pulmonary Disease

## 2014-04-09 NOTE — Progress Notes (Signed)
Quick Note:  Pt aware of results. ______ 

## 2014-04-09 NOTE — Telephone Encounter (Signed)
A,     Please let him know that this was normal    Thanks    b    Pt aware of cxr results.

## 2014-04-20 ENCOUNTER — Encounter: Payer: Self-pay | Admitting: Pulmonary Disease

## 2014-04-26 ENCOUNTER — Telehealth: Payer: Self-pay | Admitting: Pulmonary Disease

## 2014-04-26 NOTE — Telephone Encounter (Signed)
[facility] advised that the current SMN was with the provider and as soon as I had his signature I would refax this.  Nothing further needed at this time.

## 2014-05-03 ENCOUNTER — Encounter: Payer: Self-pay | Admitting: Pulmonary Disease

## 2014-05-06 ENCOUNTER — Ambulatory Visit: Payer: BLUE CROSS/BLUE SHIELD | Admitting: Pulmonary Disease

## 2014-05-14 ENCOUNTER — Ambulatory Visit: Admit: 2014-05-14 | Disposition: A | Discharge status: Home / Self Care | Payer: Self-pay | Admitting: Specialist

## 2014-06-01 ENCOUNTER — Encounter
Admit: 2014-06-01 | Disposition: A | Discharge status: Home / Self Care | Payer: Self-pay | Attending: Pulmonary Disease | Admitting: Pulmonary Disease

## 2014-06-22 ENCOUNTER — Other Ambulatory Visit (INDEPENDENT_AMBULATORY_CARE_PROVIDER_SITE_OTHER): Payer: BLUE CROSS/BLUE SHIELD

## 2014-06-22 ENCOUNTER — Encounter: Payer: Self-pay | Admitting: Pulmonary Disease

## 2014-06-22 ENCOUNTER — Ambulatory Visit (INDEPENDENT_AMBULATORY_CARE_PROVIDER_SITE_OTHER): Payer: BLUE CROSS/BLUE SHIELD | Admitting: Pulmonary Disease

## 2014-06-22 VITALS — BP 134/72 | HR 71 | Ht 73.0 in | Wt 288.0 lb

## 2014-06-22 DIAGNOSIS — K219 Gastro-esophageal reflux disease without esophagitis: Secondary | ICD-10-CM

## 2014-06-22 DIAGNOSIS — J84112 Idiopathic pulmonary fibrosis: Secondary | ICD-10-CM

## 2014-06-22 LAB — HEPATIC FUNCTION PANEL
ALT: 32 U/L (ref 0–53)
AST: 23 U/L (ref 0–37)
Albumin: 4.3 g/dL (ref 3.5–5.2)
Alkaline Phosphatase: 97 U/L (ref 39–117)
BILIRUBIN DIRECT: 0.1 mg/dL (ref 0.0–0.3)
BILIRUBIN TOTAL: 0.3 mg/dL (ref 0.2–1.2)
Total Protein: 7.2 g/dL (ref 6.0–8.3)

## 2014-06-22 NOTE — Assessment & Plan Note (Signed)
His acid reflux has worsened significantly since starting Esbriet. This is causing a cough and mucus production.  Plan: -Acid reflux modifying lifestyle changes reviewed today in clinic -He was provided with literature regarding acid reflux lifestyle modification -Increase Nexium to twice a day

## 2014-06-22 NOTE — Patient Instructions (Signed)
Keep taking the Esbriet as you are doing Use sunscreen when in the sun Follow the acid reflux lifestyle modification changes Take Nexium twice a day We will check your blood work for the next 3 months We will arrange a pulmonary function test and 6 minute walk on your next visit 3 months from now

## 2014-06-22 NOTE — Progress Notes (Signed)
Subjective:    Patient ID: Kevin Boyd, male    DOB: 1966-01-13, 49 y.o.   MRN: [id number]  Synopsis: First evaluated by  pulmonary in 2015 for usual interstitial pneumonitis found on a biopsy. He has a family history significant for pulmonary fibrosis in that his mother died and he has a brother with the disease. Serology panel in 2015 was negative for connective tissue disease.  Diagnosed with IPF.  09/2002 [place] CXR showed infiltrates transiently that cleared on a follow up film one month later 01/2014 ANA, RF, SCL-70, SSA/SSB, Aldolase, Anti-Jo-1, centromere all neg August 2015 CT chest> There is groundglass opacification, scattered intralobular septal thickening, traction bronchiectasis and peripheral based honeycombing. The majority of the interstitial changes are peripheral based. There does not appear to be a craniocaudal gradient 12/28/2013 open lung biopsy: pulmonary fibrosis, severe, 1 sections show honeycombing, fibroblastic foci and overall variegated appearance with subpleural accentuation fibrosis, most consistent with usual interstitial pneumonitis. Granulomas are not seen. 03/22/2014 PFT> Ratio 82%, FEV1 2.91L (74% pred, 8% pred), TLCO 4.67L (62% pred), DLCO 35.4 (45% pred) 04/2014 6MW 1660 feet, O2 98% RA   HPI Chief Complaint  Patient presents with  . Follow-up    Pt c/o sinus congestion, PND- due to pollen.  Pt has started taking Esbriet, c/o nausea and increased fatigue with this.  c/o difficulty taking a deep breath, states he has burning in chest when he takes a deep breath.      Izick has been doing okay since the last visit. He does continue to have some shortness of breath which she attributes to a change that happened around the time of the surgery. He notes some chest tightness when he walks. He has a minimal cough but he does note the sensation of mucus in his throat on a fairly regular basis. He states that he almost stopped taking the Esbriet because of  dizziness and GI upset. He says that he's had some loose stools and nausea. He continues to take Esbriet at full dose, he has been at that dose for the last 4 weeks. He says his acid reflux has worsened significantly.  Past Medical History  Diagnosis Date  . Sleep apnea   . Interstitial lung disease   . Bipolar 1 disorder   . GERD (gastroesophageal reflux disease)   . Seasonal allergies   . Scoliosis   . COPD (chronic obstructive pulmonary disease)   . Asthma   . C. difficile diarrhea       Review of Systems  Constitutional: Negative for fever and chills.  HENT: Negative for postnasal drip, rhinorrhea and sinus pressure.   Respiratory: Positive for cough and shortness of breath. Negative for wheezing.   Cardiovascular: Negative for chest pain, palpitations and leg swelling.       Objective:   Physical Exam Filed Vitals:   06/22/14 0850  BP: 134/72  Pulse: 71  Height: 6' 1"  (1.854 m)  Weight: 288 lb (130.636 kg)  SpO2: 100%  RA  Ambulated 500 feet on RA and O2 saturation remained > 97%  Gen: well appearing, no acute distress HEENT: NCAT, EOMi, OP clear,  PULM: Crackles bilaterally in bases, no wheezing CV: RRR, no mgr, no JVD AB: BS+, soft, nontender,  Ext: warm, no edema, no clubbing, no cyanosis Derm: no rash or skin breakdown Neuro: A&Ox4, MAEW  Records from [place] were reviewed, he had a chest x-ray in 2004 which showed pneumonia, however these findings resolved within one month on a follow-up  x-ray Serology was sent in November 2015> ANA, rheumatoid factor, CCP, anti-SCL 70, SSA/SSB, anti-Jo-1, aldolase all negative, ESR slightly elevated at 44 11/09/2013 CT chest images reviewed again today  August 2015 CT chest images reviewed> Consistent with UIP; There is groundglass opacification, scattered intralobular septal thickening, traction bronchiectasis and peripheral based honeycombing. The majority of the interstitial changes are peripheral based. There does not  appear to be a craniocaudal gradient; 54m nodule RLL  12/28/2013 open lung biopsy: pulmonary fibrosis, severe, 1 sections show honeycombing, fibroblastic foci and overall variegated appearance with subpleural accentuation fibrosis, most consistent with usual interstitial pneumonitis. Granulomas are not seen.      Assessment & Plan:   IPF (idiopathic pulmonary fibrosis) This has been a stable interval for Gershon. He is experiencing shortness of breath which is to be expected with his idiopathic pulmonary fibrosis. He has been experiencing some mild side effects including gastroesophageal reflux disease worsening since starting the Esbriet. He is been at full dose Esbriet for one month now. He is interested in participating in a clinical trial for idiopathic pulmonary fibrosis which we discussed today.  Plan: -Pulmonary function testing in 3 months -Liver function testing now and monthly for the next 3 months -6 minute walk and pulmonary function test before the next visit 3 months from now -Continue Esbriet   GERD (gastroesophageal reflux disease) His acid reflux has worsened significantly since starting Esbriet. This is causing a cough and mucus production.  Plan: -Acid reflux modifying lifestyle changes reviewed today in clinic -He was provided with literature regarding acid reflux lifestyle modification -Increase Nexium to twice a day     Updated Medication List Outpatient Encounter Prescriptions as of 06/22/2014  Medication Sig  . aspirin EC 81 MG tablet Take 81 mg by mouth daily.  .Marland KitchenBioflavonoid Products (ESTER C PO) Take 1 tablet by mouth daily.  . carbamazepine (TEGRETOL XR) 100 MG 12 hr tablet Take 100 mg by mouth 2 (two) times daily. 1 tab qam, 2 tabs qhs  . cetirizine (ZYRTEC) 10 MG tablet Take 10 mg by mouth daily.  . Cholecalciferol (VITAMIN D-3) 5000 UNITS TABS Take 1 tablet by mouth daily.  .Marland Kitchenesomeprazole (NEXIUM) 40 MG capsule Take 40 mg by mouth 2 (two) times daily  before a meal.   . ferrous gluconate (FERGON) 324 MG tablet Take 324 mg by mouth daily with breakfast.  . gabapentin (NEURONTIN) 100 MG capsule 1011mtid for 1 week, 200 mg tid for 1 week, 300 mg tid for 1 week then 400 mg until next office visit.  . Marland KitchenamoTRIgine (LAMICTAL) 200 MG tablet Take 200 mg by mouth 2 (two) times daily.  . Pirfenidone 267 MG CAPS Take 3 tablets by mouth 3 (three) times daily.  . Probiotic Product (PROBIOTIC DAILY PO) Take 1 tablet by mouth daily.  . Marland Kitchenlbuterol (PROVENTIL HFA;VENTOLIN HFA) 108 (90 BASE) MCG/ACT inhaler Inhale 2 puffs into the lungs every 6 (six) hours as needed for wheezing or shortness of breath.  . Fluticasone-Salmeterol (ADVAIR) 500-50 MCG/DOSE AEPB Inhale 1 puff into the lungs 2 (two) times daily.  . [DISCONTINUED] clonazePAM (KLONOPIN) 0.5 MG tablet Take 0.5 mg by mouth 2 (two) times daily as needed for anxiety.  . [DISCONTINUED] doxycycline (VIBRAMYCIN) 50 MG capsule Take 1 capsule (50 mg total) by mouth 2 (two) times daily.

## 2014-06-22 NOTE — Assessment & Plan Note (Signed)
This has been a stable interval for Kevin Boyd. He is experiencing shortness of breath which is to be expected with his idiopathic pulmonary fibrosis. He has been experiencing some mild side effects including gastroesophageal reflux disease worsening since starting the Esbriet. He is been at full dose Esbriet for one month now. He is interested in participating in a clinical trial for idiopathic pulmonary fibrosis which we discussed today.  Plan: -Pulmonary function testing in 3 months -Liver function testing now and monthly for the next 3 months -6 minute walk and pulmonary function test before the next visit 3 months from now -Continue Esbriet

## 2014-06-22 NOTE — Progress Notes (Signed)
Quick Note:  Spoke with pt. Discussed lab results per Dr. Kendrick FriesMcQuaid. He verbalized understanding and voiced no further questions or concerns at this time. ______

## 2014-06-23 ENCOUNTER — Telehealth: Payer: Self-pay | Admitting: Pulmonary Disease

## 2014-06-23 MED ORDER — ESOMEPRAZOLE MAGNESIUM 40 MG PO CPDR: 40.0000 mg | DELAYED_RELEASE_CAPSULE | Freq: Two times a day (BID) | ORAL | Status: DC

## 2014-06-23 NOTE — Telephone Encounter (Signed)
Prescription was not called in.  Called in prescription.  Patient notified. Nothing further needed.

## 2014-07-02 ENCOUNTER — Encounter
Admit: 2014-07-02 | Disposition: A | Discharge status: Home / Self Care | Payer: Self-pay | Attending: Pulmonary Disease | Admitting: Pulmonary Disease

## 2014-07-08 ENCOUNTER — Telehealth: Payer: Self-pay | Admitting: Pulmonary Disease

## 2014-07-08 NOTE — Telephone Encounter (Signed)
Need to reduce his Esbriet dose.  If he is taking 3 tabs, 3 times daily as listed in his chart, then-- reduce to 2 tablets, 3 times daily

## 2014-07-08 NOTE — Telephone Encounter (Signed)
Pt is aware of CY's recommendations. I will change this on his medication list. Nothing further was needed.

## 2014-07-08 NOTE — Telephone Encounter (Signed)
Spoke with pt . He states that he has been having episodes of dizziness and lightheadedness for 3 days. Thinks this could be coming from taking Esbriet. Pt also states that he has been having loose stools as well.  CY - please advise as BQ is working 11pm Elink. Thanks.

## 2014-07-16 ENCOUNTER — Encounter: Payer: Self-pay | Admitting: Pulmonary Disease

## 2014-07-24 NOTE — Op Note (Signed)
PATIENT NAME:  Kevin Boyd, Kevin MR#:  [id number] DATE OF BIRTH:  1965-04-23  DATE OF PROCEDURE:  12/28/2013  SURGEON: Marcial Pacasimothy E. Thelma Bargeaks, MD    ASSISTANT: Dr. Marshia Lyandy Ely    PREOPERATIVE DIAGNOSIS: Right lower lobe mass.   POSTOPERATIVE DIAGNOSIS: Right lower lobe mass.  OPERATION PERFORMED: 1.  Preoperative bronchoscopy to assess endobronchial anatomy.  2. Right thoracoscopy with conversion to thoracotomy for wedge resection of right lower lobe mass.   INDICATIONS FOR PROCEDURE: Mr. Kevin Boyd is a 49 year old man who presented with increasing shortness of breath and a chest x-ray consistent with pulmonary fibrosis. He had a CT scan done for part of his evaluation and was found to have a 9-mm right lower lobe nodule. The patient was very concerned about this being a possible malignancy, and he wished to have this removed. The indications and risks of the above-named procedures were explained to the patient who gave his informed consent.   DESCRIPTION OF PROCEDURE: The patient was brought to the operating suite and placed in the supine position. General endotracheal anesthesia was given through a double-lumen tube. Preoperative bronchoscopy was carried out and was normal to the subsegmental levels bilaterally. The patient was then turned for a right thoracoscopy. All pressure points were carefully padded. The patient was prepped and draped in the usual sterile fashion. The chest was accessed through a single 20-mm port in the anterior axillary line at approximately the fifth interspace. Once this was accomplished, we then had a good look at the lung. Two additional ports were created allowing us to try to palpate the lesion within the right lower lobe. However, because of the patient's size, it was near impossible to reach into the hemithorax with a finger. Even with the lung being elevated to a digital palpating finger, I could not adequately examine the lung and, therefore, we had to convert to an open  thoracotomy. The 2 most superior thoracoscopy sites were connected and the latissimus and serratus muscles were divided. The chest was entered. We could see that the external appearance of the lungs was quite abnormal. There were cystic lesions scattered throughout. We could palpate the entire right lower lobe and found a single 8-mm nodule within the superior aspect of the right lower lobe. Using an endoscopic stapler, this was removed from the lung. Frozen section confirmed this to be a lymph node. There was no evidence of malignancy. A single 32-French chest tube was inserted through our most inferior port site and brought out through a separate stab wound. The chest was then closed with #2 Vicryl pericostal sutures. The muscles of the chest wall were closed with #2 Vicryl, the subcutaneous tissues with 2-0 Vicryl, and the skin with skin clips. The inferior port site was closed with interrupted 0 Vicryl, 2-0 Vicryl, and 4-0 nylon. The chest tube was secured with silk and sterile dressings were then applied. The patient was rolled in the supine position where he was extubated and taken to the recovery room in stable condition.    ____________________________ Sheppard Plumberimothy E. Thelma Bargeaks, MD teo:lr D: 12/28/2013 16:41:25 ET T: 12/28/2013 19:40:38 ET JOB#: [id number]  cc: Marcial Pacasimothy E. Thelma Bargeaks, MD, <Dictator> Jasmine DecemberIMOTHY E Gwenetta Devos MD ELECTRONICALLY SIGNED 01/19/2014 10:55

## 2014-07-24 NOTE — Discharge Summary (Signed)
PATIENT NAME:  Kevin Boyd, Kevin Boyd MR#:  [id number] DATE OF BIRTH:  12-07-1965  DATE OF ADMISSION:  12/28/2013 DATE OF DISCHARGE:  01/01/2014  ADMITTING DIAGNOSIS: Pulmonary fibrosis with right lower lobe mass.   DISCHARGE DIAGNOSIS: Pulmonary fibrosis with right lower lobe mass.   OPERATION PERFORMED: Right thoracotomy with wedge resection of right lower lobe mass with frozen section and permanent section confirming the presence of a benign lymph node.   HOSPITAL COURSE: Mr. Kevin MediateRobert Yom is a 49 year old gentleman who presented to Dr. Meredeth IdeFleming with a chief complaint of shortness of breath. He had a chest x-ray and a CT scan performed which suggested the diagnosis of pulmonary fibrosis. In addition, he had a 1 cm nodule identified in the right lower lobe, and the patient was very concerned about this. He was seen by me and felt to be a suitable candidate for thoracoscopy and possible thoracotomy with wedge resection. He was taken to the operating room on September 28th where he underwent a thoracoscopy with conversion to an open thoracotomy. At the time his lungs did demonstrate grossly the presence of pulmonary fibrosis. This was confirmed histologically. The lesion in the right lower lobe was a benign lymph node, on both frozen and permanent examination. The patient was nursed overnight in the intensive care unit and then on the floor for the subsequent hospital stay. His main complaint was postoperative pain which was ultimately controlled with oral narcotics and he had his chest tube removed and was discharged to home on January 01, 2014. At the time of discharge, he was instructed to follow up with me in several days with another chest x-ray. He was given a prescription for acetaminophen/oxycodone 325/7.5 mg one to two tablets to take every 4 to 6 hours as needed. In addition, he had his other medications continued which included Lamictal, Advair, Tegretol, Zyrtec, Klonopin, Nexium and Nasacort. He will also  follow-up with Dr. Meredeth IdeFleming who was kind enough to see the patient during his hospitalization. At the time of discharge, his wounds were healing as expected. His chest x-ray showed no evidence of pneumothorax or pleural effusion.  ____________________________ Sheppard Plumberimothy E. Thelma Bargeaks, MD teo:sb D: 01/19/2014 10:58:00 ET T: 01/19/2014 11:22:45 ET JOB#: [id number]  cc: Sheppard Plumberimothy E. Thelma Bargeaks, MD, <Dictator> Jasmine DecemberIMOTHY E Lazar Tierce MD ELECTRONICALLY SIGNED 02/16/2014 15:31

## 2014-08-02 ENCOUNTER — Ambulatory Visit: Payer: BLUE CROSS/BLUE SHIELD

## 2014-08-04 ENCOUNTER — Ambulatory Visit: Payer: BLUE CROSS/BLUE SHIELD

## 2014-08-06 ENCOUNTER — Ambulatory Visit: Payer: BLUE CROSS/BLUE SHIELD

## 2014-08-09 ENCOUNTER — Ambulatory Visit: Payer: BLUE CROSS/BLUE SHIELD

## 2014-08-11 ENCOUNTER — Ambulatory Visit: Payer: BLUE CROSS/BLUE SHIELD

## 2014-08-13 ENCOUNTER — Ambulatory Visit: Payer: BLUE CROSS/BLUE SHIELD

## 2014-08-16 ENCOUNTER — Ambulatory Visit: Payer: BLUE CROSS/BLUE SHIELD

## 2014-08-17 ENCOUNTER — Telehealth: Payer: Self-pay | Admitting: Pulmonary Disease

## 2014-08-17 DIAGNOSIS — R5383 Other fatigue: Secondary | ICD-10-CM

## 2014-08-17 NOTE — Telephone Encounter (Signed)
Spoke with Kevin Boyd, states that since weather (past few weeks) has began warming up Kevin Boyd is having increased fatigue.  Kevin Boyd is taking 2 tabs tid of Esbriet- this was lowered d/t him having increased fatigue.  Kevin Boyd is wanting to know if this increase in fatigue could be related to the esbriet, or just general deconditioning from his lung disease.  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  Dr. Kendrick FriesMcQuaid please advise on recs.  Thanks!

## 2014-08-18 ENCOUNTER — Ambulatory Visit: Payer: BLUE CROSS/BLUE SHIELD

## 2014-08-19 ENCOUNTER — Telehealth: Payer: Self-pay | Admitting: Pulmonary Disease

## 2014-08-19 NOTE — Telephone Encounter (Signed)
Pt has not heard from anyone. What is the status of this.  [phone]

## 2014-08-19 NOTE — Telephone Encounter (Signed)
Per Dr. Kendrick FriesMcQuaid, fatigue may be caused by Esbriet, needs to have labs drawn. (CBC, CMP)  Patient notified.  Orders entered, will advise patient once labs have come back.  To [facility] Sheldonshley to follow up on labs.

## 2014-08-19 NOTE — Telephone Encounter (Signed)
Spoke with pt, states that since weather (past few weeks) has began warming up pt is having increased fatigue. Pt is taking 2 tabs tid of Esbriet- this was lowered d/t him having increased fatigue. Pt is wanting to know if this increase in fatigue could be related to the esbriet, or just general deconditioning from his lung disease. Pt works inside in a non-airconditioned building and states he is ready to go to sleep after getting home from work, which is abnormal for him.   Dr. Kendrick FriesMcQuaid, please advise so I know what to tell patient.

## 2014-08-19 NOTE — Telephone Encounter (Signed)
Called spoke with Minerva AreolaEric. He reports he is only showing where pt has an esbriet shipment to go out tomorrow. They have updated RX on file for 2 capsules TID. Nothing further needed

## 2014-08-20 ENCOUNTER — Ambulatory Visit: Payer: BLUE CROSS/BLUE SHIELD

## 2014-08-20 ENCOUNTER — Other Ambulatory Visit (INDEPENDENT_AMBULATORY_CARE_PROVIDER_SITE_OTHER): Payer: BLUE CROSS/BLUE SHIELD

## 2014-08-20 DIAGNOSIS — J84112 Idiopathic pulmonary fibrosis: Secondary | ICD-10-CM | POA: Diagnosis not present

## 2014-08-20 DIAGNOSIS — R5383 Other fatigue: Secondary | ICD-10-CM | POA: Diagnosis not present

## 2014-08-20 LAB — HEPATIC FUNCTION PANEL
ALK PHOS: 92 U/L (ref 39–117)
ALT: 25 U/L (ref 0–53)
AST: 20 U/L (ref 0–37)
Albumin: 4.1 g/dL (ref 3.5–5.2)
BILIRUBIN TOTAL: 0.4 mg/dL (ref 0.2–1.2)
Bilirubin, Direct: 0.1 mg/dL (ref 0.0–0.3)
Total Protein: 7.3 g/dL (ref 6.0–8.3)

## 2014-08-20 LAB — COMPLETE METABOLIC PANEL WITH GFR
ALT: 25 U/L (ref 0–53)
AST: 20 U/L (ref 0–37)
Albumin: 4 g/dL (ref 3.5–5.2)
Alkaline Phosphatase: 94 U/L (ref 39–117)
BUN: 12 mg/dL (ref 6–23)
CHLORIDE: 105 meq/L (ref 96–112)
CO2: 24 meq/L (ref 19–32)
Calcium: 8.9 mg/dL (ref 8.4–10.5)
Creat: 0.63 mg/dL (ref 0.50–1.35)
GFR, Est Non African American: >89 mL/min
Glucose, Bld: 101 mg/dL — ABNORMAL HIGH (ref 70–99)
Potassium: 4.6 mEq/L (ref 3.5–5.3)
Sodium: 137 mEq/L (ref 135–145)
TOTAL PROTEIN: 6.9 g/dL (ref 6.0–8.3)
Total Bilirubin: 0.5 mg/dL (ref 0.2–1.2)

## 2014-08-20 LAB — CBC WITH DIFFERENTIAL/PLATELET
Basophils Absolute: 0 10*3/uL (ref 0.0–0.1)
Basophils Relative: 0.5 % (ref 0.0–3.0)
EOS ABS: 0.1 10*3/uL (ref 0.0–0.7)
EOS PCT: 1.3 % (ref 0.0–5.0)
HCT: 40.2 % (ref 39.0–52.0)
Hemoglobin: 13.9 g/dL (ref 13.0–17.0)
LYMPHS PCT: 35.6 % (ref 12.0–46.0)
Lymphs Abs: 1.8 10*3/uL (ref 0.7–4.0)
MCHC: 34.7 g/dL (ref 30.0–36.0)
MCV: 88.8 fl (ref 78.0–100.0)
Monocytes Absolute: 0.4 10*3/uL (ref 0.1–1.0)
Monocytes Relative: 7.3 % (ref 3.0–12.0)
NEUTROS PCT: 55.3 % (ref 43.0–77.0)
Neutro Abs: 2.8 10*3/uL (ref 1.4–7.7)
Platelets: 207 10*3/uL (ref 150.0–400.0)
RBC: 4.52 Mil/uL (ref 4.22–5.81)
RDW: 13.1 % (ref 11.5–15.5)
WBC: 5 10*3/uL (ref 4.0–10.5)

## 2014-08-23 ENCOUNTER — Ambulatory Visit: Payer: BLUE CROSS/BLUE SHIELD

## 2014-08-23 NOTE — Progress Notes (Signed)
Quick Note:  Called and spoke to pt. Reviewed results and recs. Pt voiced understanding and had no further questions at this time. ______

## 2014-08-25 ENCOUNTER — Ambulatory Visit: Payer: BLUE CROSS/BLUE SHIELD

## 2014-08-26 NOTE — Telephone Encounter (Signed)
Notes Recorded by Karalee HeightAmanda P Cox, CMA on 08/23/2014 at 9:50 AM Called and spoke to pt. Reviewed results and recs. Pt voiced understanding and had no further questions at this time. Notes Recorded by Lupita Leashouglas B McQuaid, MD on 08/22/2014 at 5:00 AM A, Please let him know that his labs were completely normal. From my standpoint he should maintain the Esbriet at the current dosing (2 pills tid) until the next visit, but he can see me sooner if he feels it is needed. Thanks B    Nothing more needed at this time.

## 2014-08-27 ENCOUNTER — Ambulatory Visit: Payer: BLUE CROSS/BLUE SHIELD

## 2014-09-01 ENCOUNTER — Ambulatory Visit: Payer: BLUE CROSS/BLUE SHIELD

## 2014-09-03 ENCOUNTER — Ambulatory Visit: Payer: BLUE CROSS/BLUE SHIELD

## 2014-09-06 ENCOUNTER — Ambulatory Visit: Payer: BLUE CROSS/BLUE SHIELD

## 2014-09-08 ENCOUNTER — Ambulatory Visit: Payer: BLUE CROSS/BLUE SHIELD

## 2014-09-10 ENCOUNTER — Ambulatory Visit: Payer: BLUE CROSS/BLUE SHIELD

## 2014-09-13 ENCOUNTER — Ambulatory Visit: Payer: BLUE CROSS/BLUE SHIELD

## 2014-09-15 ENCOUNTER — Ambulatory Visit: Payer: BLUE CROSS/BLUE SHIELD

## 2014-09-17 ENCOUNTER — Ambulatory Visit: Payer: BLUE CROSS/BLUE SHIELD

## 2014-09-20 ENCOUNTER — Ambulatory Visit: Payer: BLUE CROSS/BLUE SHIELD

## 2014-09-22 ENCOUNTER — Ambulatory Visit: Payer: BLUE CROSS/BLUE SHIELD

## 2014-09-24 ENCOUNTER — Ambulatory Visit: Payer: BLUE CROSS/BLUE SHIELD

## 2014-09-27 ENCOUNTER — Ambulatory Visit: Payer: BLUE CROSS/BLUE SHIELD

## 2014-09-29 ENCOUNTER — Ambulatory Visit: Payer: BLUE CROSS/BLUE SHIELD

## 2014-10-01 ENCOUNTER — Ambulatory Visit: Payer: BLUE CROSS/BLUE SHIELD

## 2014-10-05 ENCOUNTER — Other Ambulatory Visit (INDEPENDENT_AMBULATORY_CARE_PROVIDER_SITE_OTHER): Payer: BLUE CROSS/BLUE SHIELD

## 2014-10-05 ENCOUNTER — Ambulatory Visit (INDEPENDENT_AMBULATORY_CARE_PROVIDER_SITE_OTHER): Payer: BLUE CROSS/BLUE SHIELD | Admitting: Pulmonary Disease

## 2014-10-05 ENCOUNTER — Encounter: Payer: Self-pay | Admitting: Pulmonary Disease

## 2014-10-05 VITALS — BP 126/74 | HR 70 | Ht 73.0 in | Wt 290.0 lb

## 2014-10-05 DIAGNOSIS — Z5181 Encounter for therapeutic drug level monitoring: Secondary | ICD-10-CM | POA: Diagnosis not present

## 2014-10-05 DIAGNOSIS — J84112 Idiopathic pulmonary fibrosis: Secondary | ICD-10-CM

## 2014-10-05 DIAGNOSIS — R5383 Other fatigue: Secondary | ICD-10-CM

## 2014-10-05 LAB — PULMONARY FUNCTION TEST
DL/VA % PRED: 82 %
DL/VA: 4.02 ml/min/mmHg/L
DLCO unc % pred: 60 %
DLCO unc: 22.72 ml/min/mmHg
FEF 25-75 PRE: 3.51 L/s
FEF 25-75 Post: 1.55 L/sec
FEF2575-%Change-Post: -55 %
FEF2575-%PRED-POST: 39 %
FEF2575-%PRED-PRE: 89 %
FEV1-%CHANGE-POST: -24 %
FEV1-%Pred-Post: 54 %
FEV1-%Pred-Pre: 71 %
FEV1-POST: 2.44 L
FEV1-Pre: 3.22 L
FEV1FVC-%Change-Post: -13 %
FEV1FVC-%PRED-PRE: 106 %
FEV6-%CHANGE-POST: -12 %
FEV6-%PRED-POST: 60 %
FEV6-%PRED-PRE: 68 %
FEV6-POST: 3.39 L
FEV6-PRE: 3.87 L
FEV6FVC-%PRED-POST: 103 %
FEV6FVC-%Pred-Pre: 103 %
FVC-%Change-Post: -12 %
FVC-%PRED-PRE: 66 %
FVC-%Pred-Post: 58 %
FVC-PRE: 3.87 L
FVC-Post: 3.39 L
PRE FEV1/FVC RATIO: 83 %
PRE FEV6/FVC RATIO: 100 %
Post FEV1/FVC ratio: 72 %
Post FEV6/FVC ratio: 100 %
RV % pred: 72 %
RV: 1.61 L
TLC % pred: 70 %
TLC: 5.48 L

## 2014-10-05 LAB — HEPATIC FUNCTION PANEL
ALT: 29 U/L (ref 0–53)
AST: 24 U/L (ref 0–37)
Albumin: 4.1 g/dL (ref 3.5–5.2)
Alkaline Phosphatase: 85 U/L (ref 39–117)
Bilirubin, Direct: 0.1 mg/dL (ref 0.0–0.3)
Total Bilirubin: 0.3 mg/dL (ref 0.2–1.2)
Total Protein: 7.4 g/dL (ref 6.0–8.3)

## 2014-10-05 NOTE — Progress Notes (Signed)
SMW performed today. 

## 2014-10-05 NOTE — Progress Notes (Signed)
PFT performed today. 

## 2014-10-05 NOTE — Assessment & Plan Note (Signed)
This is a new problem associated with dizziness. I believe that is likely related to volume depletion from diarrhea related to Esbriet. However, it does not appear to be severe. His physical exam was within normal limits today.  Plan: Check CBC to rule out anemia as a cause of fatigue Encouraged increased by mouth intake Encouraged Imodium for diarrhea

## 2014-10-05 NOTE — Progress Notes (Signed)
Subjective:    Patient ID: Kevin Boyd, male    DOB: 01/29/1966, 49 y.o.   MRN: [id number]  Synopsis: First evaluated by [facility] in 2015 for usual interstitial pneumonitis found on a biopsy. He has a family history significant for pulmonary fibrosis in that his mother died and he has a brother with the disease. Serology panel in 2015 was negative for connective tissue disease.  Diagnosed with IPF.  09/2002 [facility] CXR showed infiltrates transiently that cleared on a follow up film one month later 01/2014 ANA, RF, SCL-70, SSA/SSB, Aldolase, Anti-Jo-1, centromere all neg August 2015 CT chest> There is groundglass opacification, scattered intralobular septal thickening, traction bronchiectasis and peripheral based honeycombing. The majority of the interstitial changes are peripheral based. There does not appear to be a craniocaudal gradient 12/28/2013 open lung biopsy: pulmonary fibrosis, severe, 1 sections show honeycombing, fibroblastic foci and overall variegated appearance with subpleural accentuation fibrosis, most consistent with usual interstitial pneumonitis. Granulomas are not seen. 03/22/2014 PFT> Ratio 82%, FEV1 2.91L (74% pred, 8% pred), TLCO 4.67L (62% pred), DLCO 35.4 (45% pred) 04/2014 6MW 1660 feet, O2 98% RA   HPI Chief Complaint  Patient presents with  . Follow-up    f/u IPF; PFT/SMW results.  pt c/o increased sob with humid/hot temps.    Pt still taking Esbriet- does note dizziness on this medication.     He has been complaining of dizziness lately, he associates it with fatigue.  He says that he had this prior to the Esbriet, but it is a little worse.  He says that it was really worse yesterday.  It is typically worse when bending over or exerting himself.  Just climbing a flight of stairs with 5 pounds makes it worse.  He feels like the room is spinning.  He has never blacked out and sometimes feels like he might black out with this.   He says that he is gaining  weight. He remains active with work, has not been exercising more lately.  He has not had problems with acid reflux since adding the prilosec.   He thinks that the humidity is making his breathing a little worse.  He says that he has a lot of fatigue with it.  He feels lethargic a lot.   Past Medical History  Diagnosis Date  . Sleep apnea   . Interstitial lung disease   . Bipolar 1 disorder   . GERD (gastroesophageal reflux disease)   . Seasonal allergies   . Scoliosis   . COPD (chronic obstructive pulmonary disease)   . Asthma   . C. difficile diarrhea       Review of Systems  Constitutional: Negative for fever and chills.  HENT: Negative for postnasal drip, rhinorrhea and sinus pressure.   Respiratory: Positive for shortness of breath. Negative for wheezing.   Cardiovascular: Negative for chest pain, palpitations and leg swelling.  Gastrointestinal: Positive for diarrhea.       Objective:   Physical Exam Filed Vitals:   10/05/14 1002 10/05/14 1010  BP: 124/76 126/74  Pulse: 73 70  Height: _0  (1.88 m) _1  (1.854 m)  Weight: 290 lb (131.543 kg) 290 lb (131.543 kg)  SpO2: 97% 95%  RA  Ambulated 500 feet on RA and O2 saturation remained > 97%  Gen: well appearing, no acute distress HEENT: NCAT, EOMi, OP clear,  PULM: Crackles bilaterally in bases, no wheezing CV: RRR, no mgr, no JVD AB: BS+, soft, nontender,  Ext: warm, no edema,  no clubbing, no cyanosis Derm: no rash or skin breakdown Neuro: A&Ox4, MAEW  Records from [place] were reviewed, he had a chest x-ray in 2004 which showed pneumonia, however these findings resolved within one month on a follow-up x-ray Serology was sent in November 2015> ANA, rheumatoid factor, CCP, anti-SCL 70, SSA/SSB, anti-Jo-1, aldolase all negative, ESR slightly elevated at 44 11/09/2013 CT chest images reviewed again today  August 2015 CT chest images reviewed> Consistent with UIP; There is groundglass opacification, scattered  intralobular septal thickening, traction bronchiectasis and peripheral based honeycombing. The majority of the interstitial changes are peripheral based. There does not appear to be a craniocaudal gradient; 79mm nodule RLL  12/28/2013 open lung biopsy: pulmonary fibrosis, severe, 1 sections show honeycombing, fibroblastic foci and overall variegated appearance with subpleural accentuation fibrosis, most consistent with usual interstitial pneumonitis. Granulomas are not seen.  July 2016 pulmonary function testing> ratio 72%, FEV1 2.44 L (54% Pred), forced vital capacity 3.39 L (58% predicted), total lung capacity 5.48 L (70% predicted), DLCO 22.72 (60% predicted) . July 2016 6 minute walk> 372 m) 1220 feet) O2 sat 96%     Assessment & Plan:   IPF (idiopathic pulmonary fibrosis) He has been tolerating the Esbriet with the side effect of diarrhea. I believe this is contributing to dehydration. However, he is able to function and work regularly despite this.  I am concerned that his 6 minute walk as well as his DLCO have decreased slightly. Hopefully this is just normal variation between testing measurements, but because of the decrease I would like to get another pulmonary function test as well as DLCO in 3 months instead of 6 months.  Plan: Encouraged exercise regularly and lose weight Repeat pulmonary function test and 6 minute walk in 3 months Continue Esbriet, will have him contacted by our clinical trial team to consider a trial with Ofev and Esbriet LFTs today for therapeutic drug monitoring Follow-up 3 months  Fatigue This is a new problem associated with dizziness. I believe that is likely related to volume depletion from diarrhea related to Esbriet. However, it does not appear to be severe. His physical exam was within normal limits today.  Plan: Check CBC to rule out anemia as a cause of fatigue Encouraged increased by mouth intake Encouraged Imodium for diarrhea    Updated  Medication List Outpatient Encounter Prescriptions as of 10/05/2014  Medication Sig  . albuterol (PROVENTIL HFA;VENTOLIN HFA) 108 (90 BASE) MCG/ACT inhaler Inhale 2 puffs into the lungs every 6 (six) hours as needed for wheezing or shortness of breath.  Marland Kitchen aspirin EC 81 MG tablet Take 81 mg by mouth daily.  Marland Kitchen Bioflavonoid Products (ESTER C PO) Take 1 tablet by mouth daily.  . carbamazepine (TEGRETOL XR) 100 MG 12 hr tablet Take 100 mg by mouth 2 (two) times daily. 1 tab qam, 2 tabs qhs  . cetirizine (ZYRTEC) 10 MG tablet Take 10 mg by mouth daily.  . Cholecalciferol (VITAMIN D-3) 5000 UNITS TABS Take 1 tablet by mouth daily.  Marland Kitchen esomeprazole (NEXIUM) 40 MG capsule Take 1 capsule (40 mg total) by mouth 2 (two) times daily before a meal.  . ferrous gluconate (FERGON) 324 MG tablet Take 324 mg by mouth daily with breakfast.  . fluticasone (FLONASE) 50 MCG/ACT nasal spray Place 2 sprays into both nostrils daily.  . Fluticasone-Salmeterol (ADVAIR) 500-50 MCG/DOSE AEPB Inhale 1 puff into the lungs 2 (two) times daily.  Marland Kitchen gabapentin (NEURONTIN) 100 MG capsule $RemoveBe'100mg'SHEZLDXxV$  tid for 1 week, 200  mg tid for 1 week, 300 mg tid for 1 week then 400 mg until next office visit.  Marland Kitchen lamoTRIgine (LAMICTAL) 200 MG tablet Take 200 mg by mouth 2 (two) times daily.  . Pirfenidone 267 MG CAPS Take 2 tablets by mouth 3 (three) times daily.   . Probiotic Product (PROBIOTIC DAILY PO) Take 1 tablet by mouth daily.   No facility-administered encounter medications on file as of 10/05/2014.

## 2014-10-05 NOTE — Assessment & Plan Note (Signed)
He has been tolerating the Esbriet with the side effect of diarrhea. I believe this is contributing to dehydration. However, he is able to function and work regularly despite this.  I am concerned that his 6 minute walk as well as his DLCO have decreased slightly. Hopefully this is just normal variation between testing measurements, but because of the decrease I would like to get another pulmonary function test as well as DLCO in 3 months instead of 6 months.  Plan: Encouraged exercise regularly and lose weight Repeat pulmonary function test and 6 minute walk in 3 months Continue Esbriet, will have him contacted by our clinical trial team to consider a trial with Ofev and Esbriet LFTs today for therapeutic drug monitoring Follow-up 3 months

## 2014-10-05 NOTE — Patient Instructions (Signed)
We will obtain blood work today and will continue to obtain monthly for the next 3 months We will get a pulmonary function test and 6 minute walk on the next visit We will have the clinical trial team call you within the next 24 hours Increase your fluid intake Continue to exercise on a regular basis We will see you back in 3 months or sooner if needed

## 2014-10-07 IMAGING — CR DG CHEST 2V
1 series · 2 of 2 positions shown · non-contrast
Comparison: 10/17/2013 and CT 11/09/2013

CLINICAL DATA: Preop COPD/cardiac workup.

EXAM:
CHEST  2 VIEW

[Series 1: dxr chest pa (or ap) and lateral · 0.14mm/px · 2 of 2 slices shown]
[im 1/2]
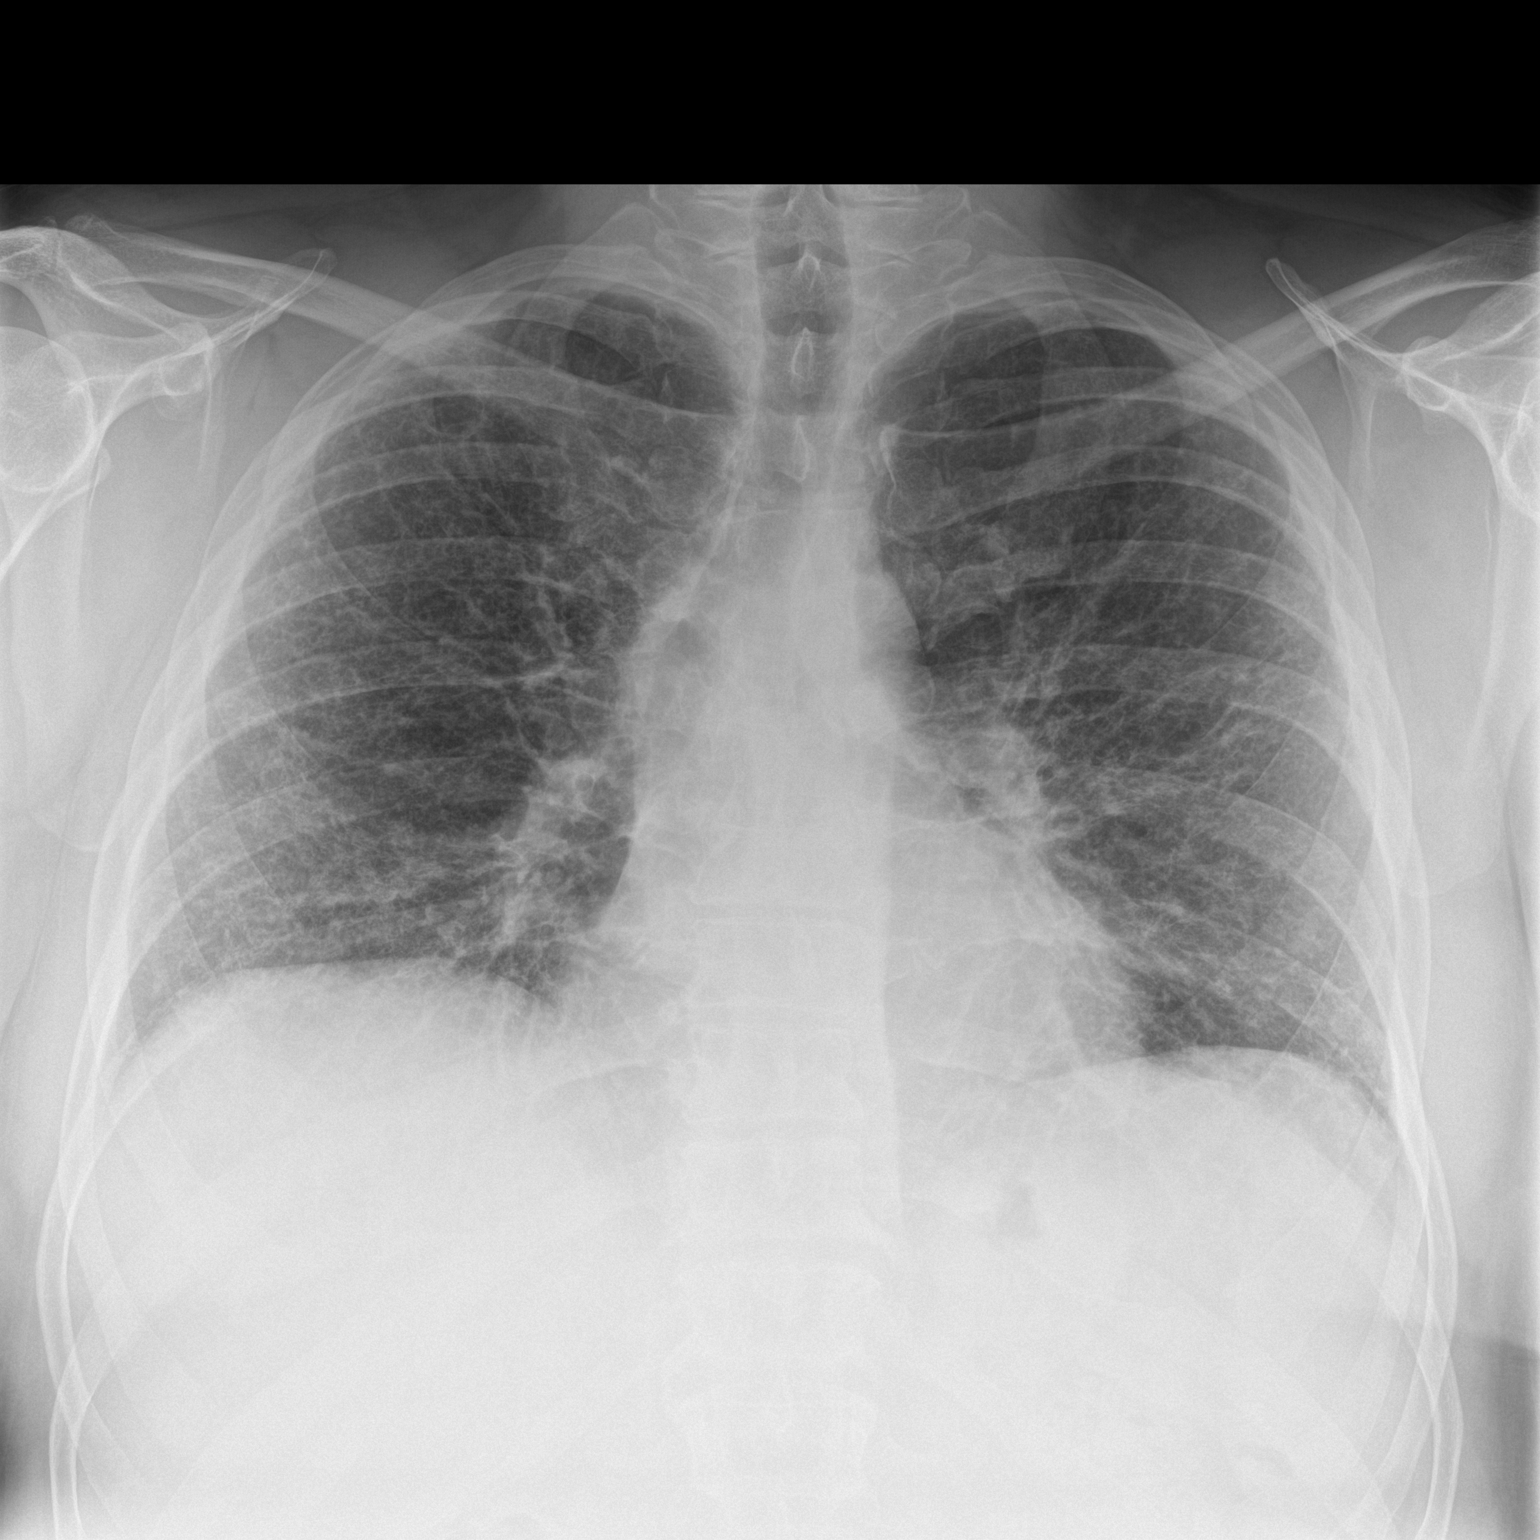
[im 2/2]
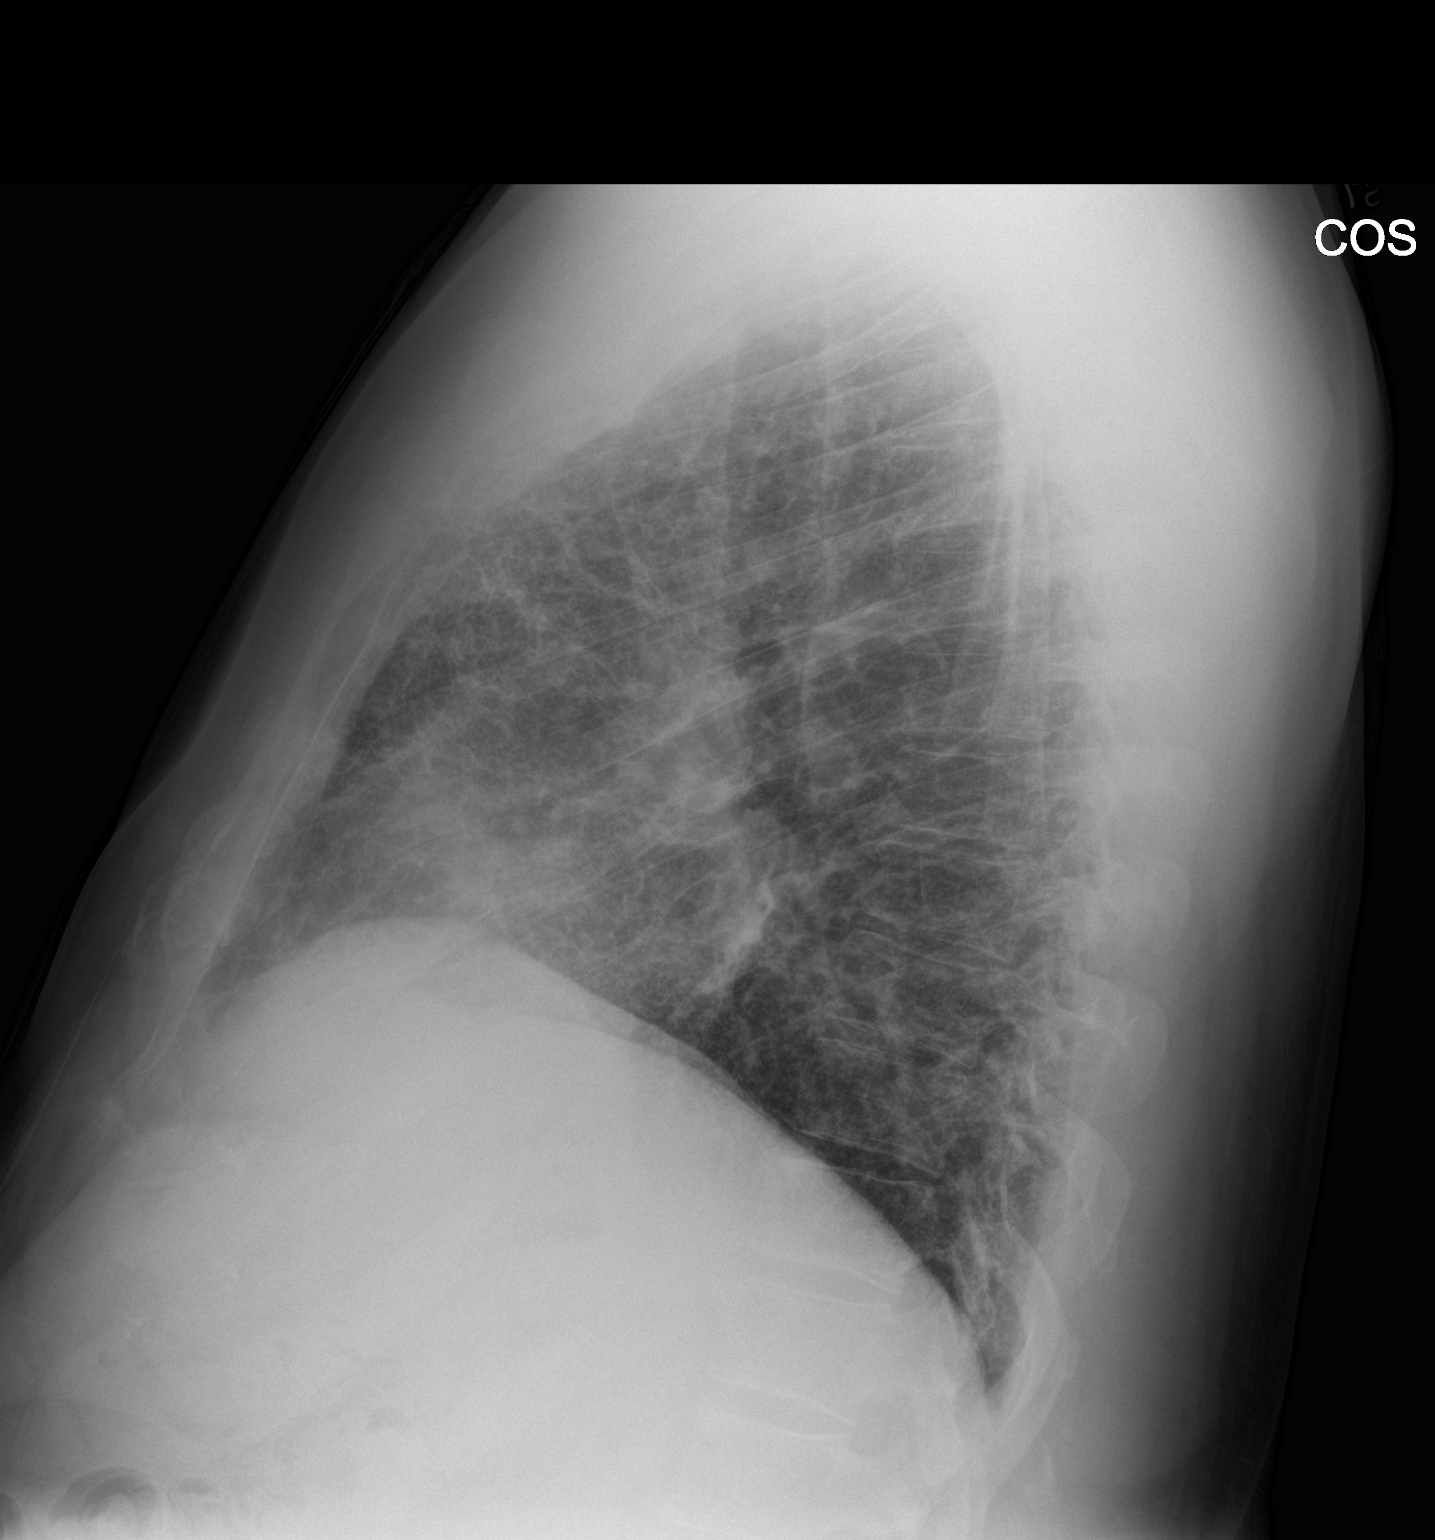

[2 of 2 positions shown; findings below may reference images not displayed]

FINDINGS: Lungs are hypoinflated with stable bilateral mild interstitial
disease most prominent over the mid to lower lungs. There is a focal
region somewhat more confluent interstitial airspace density over
the anterior lung bases on the lateral film. Cardiomediastinal
silhouette and remainder of the exam is unchanged.
IMPRESSION: Hypoinflation with interstitial lung disease. Focal somewhat more
confluent mixed interstitial airspace density over the anterior lung
bases on the lateral film which may represent atelectasis, although
cannot exclude infection.

## 2014-10-18 ENCOUNTER — Other Ambulatory Visit: Payer: Self-pay

## 2014-10-18 MED ORDER — ESOMEPRAZOLE MAGNESIUM 40 MG PO CPDR: 40.0000 mg | DELAYED_RELEASE_CAPSULE | Freq: Two times a day (BID) | ORAL | Status: DC

## 2014-11-03 ENCOUNTER — Encounter: Payer: Self-pay | Admitting: Emergency Medicine

## 2014-11-03 ENCOUNTER — Inpatient Hospital Stay
Admission: EM | Admit: 2014-11-03 | Discharge: 2014-11-06 | DRG: 103 | Disposition: A | Discharge status: Other Acute Inpatient Hospital | Payer: BLUE CROSS/BLUE SHIELD | Attending: Internal Medicine | Admitting: Internal Medicine

## 2014-11-03 ENCOUNTER — Telehealth: Payer: Self-pay | Admitting: Pulmonary Disease

## 2014-11-03 ENCOUNTER — Emergency Department: Payer: BLUE CROSS/BLUE SHIELD

## 2014-11-03 DIAGNOSIS — Z7982 Long term (current) use of aspirin: Secondary | ICD-10-CM

## 2014-11-03 DIAGNOSIS — G43409 Hemiplegic migraine, not intractable, without status migrainosus: Principal | ICD-10-CM | POA: Diagnosis present

## 2014-11-03 DIAGNOSIS — E785 Hyperlipidemia, unspecified: Secondary | ICD-10-CM | POA: Diagnosis present

## 2014-11-03 DIAGNOSIS — K219 Gastro-esophageal reflux disease without esophagitis: Secondary | ICD-10-CM | POA: Diagnosis present

## 2014-11-03 DIAGNOSIS — G8190 Hemiplegia, unspecified affecting unspecified side: Secondary | ICD-10-CM

## 2014-11-03 DIAGNOSIS — J45909 Unspecified asthma, uncomplicated: Secondary | ICD-10-CM | POA: Diagnosis present

## 2014-11-03 DIAGNOSIS — I639 Cerebral infarction, unspecified: Secondary | ICD-10-CM

## 2014-11-03 DIAGNOSIS — R471 Dysarthria and anarthria: Secondary | ICD-10-CM | POA: Diagnosis present

## 2014-11-03 DIAGNOSIS — J849 Interstitial pulmonary disease, unspecified: Secondary | ICD-10-CM | POA: Diagnosis present

## 2014-11-03 DIAGNOSIS — G4730 Sleep apnea, unspecified: Secondary | ICD-10-CM | POA: Diagnosis present

## 2014-11-03 DIAGNOSIS — R7303 Prediabetes: Secondary | ICD-10-CM

## 2014-11-03 DIAGNOSIS — R4701 Aphasia: Secondary | ICD-10-CM

## 2014-11-03 DIAGNOSIS — F319 Bipolar disorder, unspecified: Secondary | ICD-10-CM

## 2014-11-03 DIAGNOSIS — Z87891 Personal history of nicotine dependence: Secondary | ICD-10-CM

## 2014-11-03 DIAGNOSIS — M419 Scoliosis, unspecified: Secondary | ICD-10-CM | POA: Diagnosis present

## 2014-11-03 DIAGNOSIS — R7309 Other abnormal glucose: Secondary | ICD-10-CM | POA: Diagnosis present

## 2014-11-03 DIAGNOSIS — J449 Chronic obstructive pulmonary disease, unspecified: Secondary | ICD-10-CM | POA: Diagnosis present

## 2014-11-03 DIAGNOSIS — R079 Chest pain, unspecified: Secondary | ICD-10-CM | POA: Diagnosis present

## 2014-11-03 DIAGNOSIS — Z79899 Other long term (current) drug therapy: Secondary | ICD-10-CM

## 2014-11-03 DIAGNOSIS — G43909 Migraine, unspecified, not intractable, without status migrainosus: Secondary | ICD-10-CM | POA: Diagnosis present

## 2014-11-03 HISTORY — DX: Prediabetes: R73.03

## 2014-11-03 LAB — CBC
HCT: 39.6 % — ABNORMAL LOW (ref 40.0–52.0)
HEMOGLOBIN: 13.8 g/dL (ref 13.0–18.0)
MCH: 31.3 pg (ref 26.0–34.0)
MCHC: 34.8 g/dL (ref 32.0–36.0)
MCV: 89.8 fL (ref 80.0–100.0)
PLATELETS: 185 10*3/uL (ref 150–440)
RBC: 4.41 MIL/uL (ref 4.40–5.90)
RDW: 13.3 % (ref 11.5–14.5)
WBC: 8.8 10*3/uL (ref 3.8–10.6)

## 2014-11-03 LAB — BASIC METABOLIC PANEL
ANION GAP: 10 (ref 5–15)
BUN: 11 mg/dL (ref 6–20)
CALCIUM: 8.9 mg/dL (ref 8.9–10.3)
CO2: 24 mmol/L (ref 22–32)
Chloride: 101 mmol/L (ref 101–111)
Creatinine, Ser: 0.95 mg/dL (ref 0.61–1.24)
GFR calc Af Amer: >60 mL/min (ref >60)
GFR calc non Af Amer: >60 mL/min (ref >60)
GLUCOSE: 169 mg/dL — AB (ref 65–99)
POTASSIUM: 3.6 mmol/L (ref 3.5–5.1)
SODIUM: 135 mmol/L (ref 135–145)

## 2014-11-03 LAB — GLUCOSE, CAPILLARY: Glucose-Capillary: 119 mg/dL — ABNORMAL HIGH (ref 65–99)

## 2014-11-03 LAB — TROPONIN I: Troponin I: <0.03 ng/mL (ref <0.031)

## 2014-11-03 MED ORDER — ENOXAPARIN SODIUM 40 MG/0.4ML ~~LOC~~ SOLN
40.0000 mg | SUBCUTANEOUS | Status: DC
  Administered 2014-11-04 – 2014-11-05 (×3): 40 mg via SUBCUTANEOUS
  Filled 2014-11-03 (×2): qty 0.4

## 2014-11-03 MED ORDER — CARBAMAZEPINE ER 100 MG PO TB12
100.0000 mg | ORAL_TABLET | Freq: Every morning | ORAL | Status: DC
  Administered 2014-11-04 – 2014-11-06 (×3): 100 mg via ORAL
  Filled 2014-11-03 (×4): qty 1

## 2014-11-03 MED ORDER — CARBAMAZEPINE ER 200 MG PO TB12
200.0000 mg | ORAL_TABLET | Freq: Every day | ORAL | Status: DC
  Administered 2014-11-04 – 2014-11-06 (×3): 200 mg via ORAL
  Filled 2014-11-03 (×4): qty 1

## 2014-11-03 MED ORDER — ASPIRIN EC 81 MG PO TBEC
81.0000 mg | DELAYED_RELEASE_TABLET | Freq: Every day | ORAL | Status: DC
  Administered 2014-11-04 – 2014-11-06 (×3): 81 mg via ORAL
  Filled 2014-11-03 (×3): qty 1

## 2014-11-03 MED ORDER — CARBAMAZEPINE ER 100 MG PO TB12: 100.0000 mg | ORAL_TABLET | Freq: Two times a day (BID) | ORAL | Status: DC

## 2014-11-03 MED ORDER — FENTANYL CITRATE (PF) 100 MCG/2ML IJ SOLN
50.0000 ug | Freq: Once | INTRAMUSCULAR | Status: AC
  Administered 2014-11-03: 50 ug via INTRAVENOUS

## 2014-11-03 MED ORDER — ONDANSETRON HCL 4 MG/2ML IJ SOLN
4.0000 mg | Freq: Once | INTRAMUSCULAR | Status: AC
  Administered 2014-11-03: 4 mg via INTRAVENOUS

## 2014-11-03 MED ORDER — STROKE: EARLY STAGES OF RECOVERY BOOK
Freq: Once | Status: AC
  Administered 2014-11-04: 01:00:00

## 2014-11-03 MED ORDER — PANTOPRAZOLE SODIUM 40 MG PO TBEC
40.0000 mg | DELAYED_RELEASE_TABLET | Freq: Two times a day (BID) | ORAL | Status: DC
  Administered 2014-11-04 – 2014-11-06 (×6): 40 mg via ORAL
  Filled 2014-11-03 (×5): qty 1

## 2014-11-03 MED ORDER — ASPIRIN EC 325 MG PO TBEC
325.0000 mg | DELAYED_RELEASE_TABLET | Freq: Once | ORAL | Status: AC
  Administered 2014-11-04: 325 mg via ORAL
  Filled 2014-11-03: qty 1

## 2014-11-03 MED ORDER — ACETAMINOPHEN 325 MG PO TABS
650.0000 mg | ORAL_TABLET | Freq: Four times a day (QID) | ORAL | Status: DC | PRN
  Administered 2014-11-04 (×2): 650 mg via ORAL
  Filled 2014-11-03 (×2): qty 2

## 2014-11-03 MED ORDER — CARBAMAZEPINE ER 100 MG PO TB12
100.0000 mg | ORAL_TABLET | Freq: Every morning | ORAL | Status: DC
  Filled 2014-11-03: qty 1

## 2014-11-03 MED ORDER — ACETAMINOPHEN 650 MG RE SUPP: 650.0000 mg | Freq: Four times a day (QID) | RECTAL | Status: DC | PRN

## 2014-11-03 MED ORDER — LAMOTRIGINE 100 MG PO TABS
200.0000 mg | ORAL_TABLET | Freq: Two times a day (BID) | ORAL | Status: DC
  Administered 2014-11-04 – 2014-11-06 (×6): 200 mg via ORAL
  Filled 2014-11-03 (×5): qty 2

## 2014-11-03 MED ORDER — ONDANSETRON HCL 4 MG/2ML IJ SOLN
INTRAMUSCULAR | Status: AC
  Administered 2014-11-03: 4 mg via INTRAVENOUS
  Filled 2014-11-03: qty 2

## 2014-11-03 MED ORDER — FENTANYL CITRATE (PF) 100 MCG/2ML IJ SOLN
INTRAMUSCULAR | Status: AC
  Administered 2014-11-03: 50 ug via INTRAVENOUS
  Filled 2014-11-03: qty 2

## 2014-11-03 MED ORDER — SODIUM CHLORIDE 0.9 % IJ SOLN
3.0000 mL | Freq: Two times a day (BID) | INTRAMUSCULAR | Status: DC
  Administered 2014-11-04 – 2014-11-06 (×7): 3 mL via INTRAVENOUS

## 2014-11-03 NOTE — ED Notes (Signed)
MD at bedside. 

## 2014-11-03 NOTE — ED Notes (Signed)
Patient transported to CT 

## 2014-11-03 NOTE — ED Notes (Addendum)
Pt to triage via w/c with no distress noted; pt reports PTA had onset diaphoresis and dizziness with nausea & frontal HA, difficulty getting words on and "vision off" for ; denies hx of same; pt reports presently continues to have HA and dizziness; reports has also been having intermittent mid CP

## 2014-11-03 NOTE — Telephone Encounter (Signed)
Kevin Boyd called after hours because he is feeling very dizzy, sweaty, and weak.  This occurred after missing his AM dose of Esbriet then taking the next scheduled dose this afternoon.  Advised that he should try to drink and eat something, but if symptoms worsen or don't go away soon he should go to the ED to get checked out.  Also advised that he take Esbriet 1 pill tid for three days then increase back to 2 pills tid.

## 2014-11-03 NOTE — H&P (Addendum)
[facility] [place] at [facility]   PATIENT NAME: Kevin Boyd    MR#:  [id number]  DATE OF BIRTH:  22-May-1965  DATE OF ADMISSION:  11/03/2014  PRIMARY CARE PHYSICIAN: Marisue Ivan, MD   REQUESTING/REFERRING PHYSICIAN: Manson Passey, MD  CHIEF COMPLAINT:  Stroke symptoms  HISTORY OF PRESENT ILLNESS:  Kevin Boyd  is a 49 y.o. male who presents with complaint of multiple potential stroke symptoms. Patient states that around 6:30 this evening he began having word finding difficulty concurrent with an episode of some diaphoresis. He states that he knew the words that he wanted to say, but was unable to get anything out of his mouth. He also states that he had some nausea along with his diaphoresis and some epigastric abdominal pain. He states that he had some mild left-sided weakness, and that his left arm was slow to respond. Sometime after this he began to have some blurred vision and a significant headache as well. He is taking a new medicine for his pulmonary interstitial fibrosis, and called his pulmonologist wondering if these were side effects of medication. His pulmonologist told him to come to the ED to be evaluated as he did not feel these were medication side effects. Neurology consultation here in the ED recommended admission and workup for stroke. Patient's word finding difficulty resolved in the ED, that his headache persists as well as some sensory deficits, specifically perioral numbness.  PAST MEDICAL HISTORY:   Past Medical History  Diagnosis Date  . Sleep apnea   . Interstitial lung disease   . Bipolar 1 disorder   . GERD (gastroesophageal reflux disease)   . Seasonal allergies   . Scoliosis   . COPD (chronic obstructive pulmonary disease)   . Asthma   . C. difficile diarrhea   . Borderline diabetes mellitus     PAST SURGICAL HISTORY:   Past Surgical History  Procedure Laterality Date  . Back surgery      lower lumbar fusion  . Thumb  surgery    . Lung surgery      biopsy    SOCIAL HISTORY:   History  Substance Use Topics  . Smoking status: Former Smoker -- 2.00 packs/day for 30 years    Types: Cigarettes    Quit date: 12/28/2013  . Smokeless tobacco: Former Neurosurgeon  . Alcohol Use: Not on file    FAMILY HISTORY:   Family History  Problem Relation Age of Onset  . Pulmonary fibrosis Mother   . Sjogren's syndrome Mother   . Pulmonary fibrosis Maternal Grandmother     DRUG ALLERGIES:  No Known Allergies  MEDICATIONS AT HOME:   Prior to Admission medications   Medication Sig Start Date End Date Taking? Authorizing Provider  albuterol (PROVENTIL HFA;VENTOLIN HFA) 108 (90 BASE) MCG/ACT inhaler Inhale 2 puffs into the lungs every 6 (six) hours as needed for wheezing or shortness of breath.    Historical Provider, MD  aspirin EC 81 MG tablet Take 81 mg by mouth daily.    Historical Provider, MD  Bioflavonoid Products (ESTER C PO) Take 1 tablet by mouth daily.    Historical Provider, MD  carbamazepine (TEGRETOL XR) 100 MG 12 hr tablet Take 100 mg by mouth 2 (two) times daily. 1 tab qam, 2 tabs qhs    Historical Provider, MD  cetirizine (ZYRTEC) 10 MG tablet Take 10 mg by mouth daily.    Historical Provider, MD  Cholecalciferol (VITAMIN D-3) 5000 UNITS TABS Take 1 tablet by  mouth daily.    Historical Provider, MD  esomeprazole (NEXIUM) 40 MG capsule Take 1 capsule (40 mg total) by mouth 2 (two) times daily before a meal. 10/18/14   [name], MD  ferrous gluconate (FERGON) 324 MG tablet Take 324 mg by mouth daily with breakfast.    Historical Provider, MD  fluticasone (FLONASE) 50 MCG/ACT nasal spray Place 2 sprays into both nostrils daily.    Historical Provider, MD  Fluticasone-Salmeterol (ADVAIR) 500-50 MCG/DOSE AEPB Inhale 1 puff into the lungs 2 (two) times daily.    Historical Provider, MD  gabapentin (NEURONTIN) 100 MG capsule  tid for 1 week, 200 mg tid for 1 week, 300 mg tid for 1 week then 400 mg  until next office visit. 04/06/14 04/07/15  [name], MD  lamoTRIgine (LAMICTAL) 200 MG tablet Take 200 mg by mouth 2 (two) times daily.    Historical Provider, MD  Pirfenidone 267 MG CAPS Take 2 tablets by mouth 3 (three) times daily.     Historical Provider, MD  Probiotic Product (PROBIOTIC DAILY PO) Take 1 tablet by mouth daily.    Historical Provider, MD    REVIEW OF SYSTEMS:  Review of Systems  Constitutional: Positive for diaphoresis. Negative for fever, chills, weight loss and malaise/fatigue.  HENT: Negative for ear pain, hearing loss and tinnitus.   Eyes: Negative for blurred vision, double vision, pain and redness.  Respiratory: Negative for cough, hemoptysis and shortness of breath.   Cardiovascular: Positive for chest pain. Negative for palpitations, orthopnea and leg swelling.  Gastrointestinal: Positive for nausea and abdominal pain. Negative for vomiting, diarrhea and constipation.  Genitourinary: Negative for dysuria, frequency and hematuria.  Musculoskeletal: Negative for back pain, joint pain and neck pain.  Skin:       No acne, rash, or lesions  Neurological: Positive for dizziness, sensory change, speech change and focal weakness. Negative for tremors, seizures, loss of consciousness and weakness.  Endo/Heme/Allergies: Negative for polydipsia. Does not bruise/bleed easily.  Psychiatric/Behavioral: Negative for depression. The patient is not nervous/anxious and does not have insomnia.      VITAL SIGNS:   Filed Vitals:   11/03/14 1934 11/03/14 2030 11/03/14 2100 11/03/14 2130  BP: 123/97 125/76 146/83 103/52  Pulse: 79     Temp: 97.5 F (36.4 C)     TempSrc: Oral     Resp: Height:  (1.88 m)     Weight: 132.45 kg (292 lb)     SpO2: 97%      Wt Readings from Last 3 Encounters:  11/03/14 132.45 kg (292 lb)  10/05/14 131.543 kg (290 lb)  06/22/14 130.636 kg (288 lb)    PHYSICAL EXAMINATION:  Physical Exam  Constitutional: He is  oriented to person, place, and time. He appears well-developed and well-nourished. No distress.  HENT:  Head: Normocephalic and atraumatic.  Mouth/Throat: Oropharynx is clear and moist.  Eyes: Conjunctivae and EOM are normal. Pupils are equal, round, and reactive to light. No scleral icterus.  Neck: Normal range of motion. Neck supple. No JVD present. No thyromegaly present.  Cardiovascular: Normal rate, regular rhythm and intact distal pulses.  Exam reveals no gallop and no friction rub.   No murmur heard. Respiratory: Effort normal and breath sounds normal. No respiratory distress. He has no wheezes. He has no rales.  GI: Soft. Bowel sounds are normal. He exhibits no distension. There is no tenderness.  Musculoskeletal: Normal range of motion. He exhibits no edema.  No arthritis, no gout  Lymphadenopathy:    He has no cervical adenopathy.  Neurological: He is alert and oriented to person, place, and time.  Neurologic: Cranial nerves II-XII intact, Sensation intact to light touch/pinprick except for some perioral numbness, 5/5 strength in all extremities except for left upper extremity which is 4/5 strength, no dysarthria, no aphasia, though the patient does have some intermittent slight pauses when trying to speak, no dysphagia, memory intact, finger to nose testing showed no abnormality, no pronator drift, DTR intact, Babinski sign not present.   Skin: Skin is warm and dry. No rash noted. No erythema.  Psychiatric: He has a normal mood and affect. His behavior is normal. Judgment and thought content normal.    LABORATORY PANEL:   CBC  Recent Labs Lab 11/03/14 1939  WBC 8.8  HGB 13.8  HCT 39.6*  PLT 185   ------------------------------------------------------------------------------------------------------------------  Chemistries   Recent Labs Lab 11/03/14 1939  NA 135  K 3.6  CL 101  CO2 24  GLUCOSE 169*  BUN 11  CREATININE 0.95  CALCIUM 8.9    ------------------------------------------------------------------------------------------------------------------  Cardiac Enzymes  Recent Labs Lab 11/03/14 1939  TROPONINI <0.03   ------------------------------------------------------------------------------------------------------------------  RADIOLOGY:  Dg Chest 2 View  11/03/2014   CLINICAL DATA:  Patient with sweating, headache, blurry vision. Prior partial right lung resection.  EXAM: CHEST  2 VIEW  COMPARISON:  Chest radiograph 04/06/2014  FINDINGS: Stable cardiac and mediastinal contours. Interval increase in diffuse bilateral coarse interstitial thickening. Subpleural reticulation peripherally is re- demonstrated. No pleural effusion or pneumothorax.  IMPRESSION: Interval increase in diffuse bilateral coarse interstitial thickening superimposed upon previously identified subpleural reticulation. Findings may represent edema or potentially an atypical infectious process.   Electronically Signed   By: Annia Belt M.D.   On: 11/03/2014 20:08   Ct Head Wo Contrast  11/03/2014   CLINICAL DATA:  Headache.  10 min episode of difficulty speaking.  EXAM: CT HEAD WITHOUT CONTRAST  TECHNIQUE: Contiguous axial images were obtained from the base of the skull through the vertex without intravenous contrast.  COMPARISON:  None.  FINDINGS: No acute cortical infarct, hemorrhage, or mass lesion is present. The ventricles are of normal size. No significant extra-axial fluid collection is evident. The paranasal sinuses and mastoid air cells are clear. The calvarium is intact.  No significant extracranial soft tissue abnormality is present. The globes and orbits are intact.  IMPRESSION: Negative CT of the head.   Electronically Signed   By: Marin Roberts M.D.   On: 11/03/2014 20:30    EKG:   Orders placed or performed during the hospital encounter of 11/03/14  . ED EKG within 10 minutes  . ED EKG within 10 minutes    IMPRESSION AND PLAN:   Principal Problem:   Stroke - given the persistence of some of his symptoms, stroke his stroke was suspected. TPA was not given as the symptoms were resolving when he arrived to the ED. Head CT negative, and other labs largely within normal limits in the ED. We will trend his troponins, check a hemoglobin A1c, check a fasting lipid panel in the morning, get MRI/MRA, get a neurology consult, give him a full strength aspirin now, allow for permissive hypertension with a goal less than 220/110 for the first 24 hours. Active Problems:   Chest pain - first set of troponin is negative in the ED, EKG does not show signs of ischemic heart damage, we will trend his troponins tonight.   Borderline  diabetes mellitus - we'll check a hemoglobin A1c, and point-of-care glucose testing every 12 hours for now, we will carb modified diet when he passes his swallow screen   Bipolar 1 disorder - continue home medications for this   GERD (gastroesophageal reflux disease) - equivalent home dose PPI while here  All the records are reviewed and case discussed with ED provider. Management plans discussed with the patient and/or family.  DVT PROPHYLAXIS: SubQ lovenox  ADMISSION STATUS: Observation  CODE STATUS: Full  TOTAL TIME TAKING CARE OF THIS PATIENT: 45 minutes.    Kristianna Saperstein FIELDING 11/03/2014, 10:17 PM  [facility]  Office  [phone]  CC: Primary care physician; Marisue Ivan, MD

## 2014-11-03 NOTE — ED Provider Notes (Signed)
[facility] Emergency Department Provider Note  ____________________________________________  Time seen: 7:30 PM  I have reviewed the triage vital signs and the nursing notes.   HISTORY  Chief Complaint No chief complaint on file.     HPI Kevin Boyd is a 49 y.o. male presents with acute onset of frontal headache dizziness and nausea and word finding difficulty with onset at approximately 4:30 PM. She states current pain score is 8 out of 10. Patient also admits to tingling sensation around his mouth.     Past Medical History  Diagnosis Date  . Sleep apnea   . Interstitial lung disease   . Bipolar 1 disorder   . GERD (gastroesophageal reflux disease)   . Seasonal allergies   . Scoliosis   . COPD (chronic obstructive pulmonary disease)   . Asthma   . C. difficile diarrhea     Patient Active Problem List   Diagnosis Date Noted  . Fatigue 10/05/2014  . GERD (gastroesophageal reflux disease) 06/22/2014  . Chest pain 04/06/2014  . Shortness of breath 03/04/2014  . Solitary pulmonary nodule 03/04/2014  . IPF (idiopathic pulmonary fibrosis) 02/17/2014    Past Surgical History  Procedure Laterality Date  . Back surgery      lower lumbar fusion  . Thumb surgery    . Lung surgery      biopsy    Current Outpatient Rx  Name  Route  Sig  Dispense  Refill  . albuterol (PROVENTIL HFA;VENTOLIN HFA) 108 (90 BASE) MCG/ACT inhaler   Inhalation   Inhale 2 puffs into the lungs every 6 (six) hours as needed for wheezing or shortness of breath.         Marland Kitchen aspirin EC 81 MG tablet   Oral   Take 81 mg by mouth daily.         Marland Kitchen Bioflavonoid Products (ESTER C PO)   Oral   Take 1 tablet by mouth daily.         . carbamazepine (TEGRETOL XR) 100 MG 12 hr tablet   Oral   Take 100 mg by mouth 2 (two) times daily. 1 tab qam, 2 tabs qhs         . cetirizine (ZYRTEC) 10 MG tablet   Oral   Take 10 mg by mouth daily.         . Cholecalciferol  (VITAMIN D-3) 5000 UNITS TABS   Oral   Take 1 tablet by mouth daily.         Marland Kitchen esomeprazole (NEXIUM) 40 MG capsule   Oral   Take 1 capsule (40 mg total) by mouth 2 (two) times daily before a meal.   180 capsule   3   . ferrous gluconate (FERGON) 324 MG tablet   Oral   Take 324 mg by mouth daily with breakfast.         . fluticasone (FLONASE) 50 MCG/ACT nasal spray   Each Nare   Place 2 sprays into both nostrils daily.         . Fluticasone-Salmeterol (ADVAIR) 500-50 MCG/DOSE AEPB   Inhalation   Inhale 1 puff into the lungs 2 (two) times daily.         Marland Kitchen gabapentin (NEURONTIN) 100 MG capsule       tid for 1 week, 200 mg tid for 1 week, 300 mg tid for 1 week then 400 mg until next office visit.   120 capsule   3   . lamoTRIgine (LAMICTAL) 200  MG tablet   Oral   Take 200 mg by mouth 2 (two) times daily.         . Pirfenidone 267 MG CAPS   Oral   Take 2 tablets by mouth 3 (three) times daily.          . Probiotic Product (PROBIOTIC DAILY PO)   Oral   Take 1 tablet by mouth daily.           Allergies Review of patient's allergies indicates no known allergies.  Family History  Problem Relation Age of Onset  . Pulmonary fibrosis Mother   . Sjogren's syndrome Mother   . Pulmonary fibrosis Maternal Grandmother     Social History History  Substance Use Topics  . Smoking status: Former Smoker -- 2.00 packs/day for 30 years    Types: Cigarettes    Quit date: 12/28/2013  . Smokeless tobacco: Former Neurosurgeon  . Alcohol Use: Not on file    Review of Systems  Constitutional: Negative for fever. Eyes: Negative for visual changes. ENT: Negative for sore throat. Cardiovascular: Negative for chest pain. Respiratory: Negative for shortness of breath. Gastrointestinal: Negative for abdominal pain, vomiting and diarrhea. Genitourinary: Negative for dysuria. Musculoskeletal: Negative for back pain. Skin: Negative for rash. Neurological: Positive for  headaches, dizziness and words finding difficulty   10-point ROS otherwise negative.  ____________________________________________   PHYSICAL EXAM:  VITAL SIGNS: ED Triage Vitals  Enc Vitals Group     BP 11/03/14 1934 123/97 mmHg     Pulse Rate 11/03/14 1934 79     Resp 11/03/14 1934 20     Temp 11/03/14 1934 97.5 F (36.4 C)     Temp Source 11/03/14 1934 Oral     SpO2 11/03/14 1934 97 %     Weight 11/03/14 1934 292 lb (132.45 kg)     Height 11/03/14 1934 6\' 2"  (1.88 m)     Head Cir --      Peak Flow --      Pain Score 11/03/14 1935 9     Pain Loc --      Pain Edu? --      Excl. in GC? --      Constitutional: Alert and oriented. Well appearing and in no distress. Eyes: Conjunctivae are normal. PERRL. Normal extraocular movements. ENT   Head: Normocephalic and atraumatic.   Nose: No congestion/rhinnorhea.   Mouth/Throat: Mucous membranes are moist.   Neck: No stridor. Cardiovascular: Normal rate, regular rhythm. Normal and symmetric distal pulses are present in all extremities. No murmurs, rubs, or gallops. Respiratory: Normal respiratory effort without tachypnea nor retractions. Breath sounds are clear and equal bilaterally. No wheezes/rales/rhonchi. Gastrointestinal: Soft and nontender. No distention. There is no CVA tenderness. Genitourinary: deferred Musculoskeletal: Nontender with normal range of motion in all extremities. No joint effusions.  No lower extremity tenderness nor edema. Neurologic:  Normal speech and language. No gross focal neurologic deficits are appreciated. Speech is normal.  Skin:  Skin is warm, dry and intact. No rash noted. Psychiatric: Mood and affect are normal. Speech and behavior are normal. Patient exhibits appropriate insight and judgment.  ____________________________________________    LABS (pertinent positives/negatives)  Labs Reviewed  BASIC METABOLIC PANEL - Abnormal; Notable for the following:    Glucose, Bld 169  (*)    All other components within normal limits  CBC - Abnormal; Notable for the following:    HCT 39.6 (*)    All other components within normal limits  TROPONIN I  ____________________________________________   EKG  ED ECG REPORT I, Teara Duerksen, [place], the attending physician, personally viewed and interpreted this ECG.   Date: 11/03/2014  EKG Time: 7:37PM  Rate: 79  Rhythm: Normal Sinus Rhythm  Axis: none  Intervals:normal  ST&T Change: Normal   ____________________________________________    RADIOLOGY CT head revealed:    CT Head Wo Contrast (Final result) Result time: 11/03/14 20:30:56   Final result by Rad Results In Interface (11/03/14 20:30:56)   Narrative:   CLINICAL DATA: Headache. 10 min episode of difficulty speaking.  EXAM: CT HEAD WITHOUT CONTRAST  TECHNIQUE: Contiguous axial images were obtained from the base of the skull through the vertex without intravenous contrast.  COMPARISON: None.  FINDINGS: No acute cortical infarct, hemorrhage, or mass lesion is present. The ventricles are of normal size. No significant extra-axial fluid collection is evident. The paranasal sinuses and mastoid air cells are clear. The calvarium is intact.  No significant extracranial soft tissue abnormality is present. The globes and orbits are intact.  IMPRESSION: Negative CT of the head.   Electronically Signed By: Marin Roberts M.D. On: 11/03/2014 20:30          DG Chest 2 View (Final result) Result time: 11/03/14 20:08:16   Final result by Rad Results In Interface (11/03/14 20:08:16)   Narrative:   CLINICAL DATA: Patient with sweating, headache, blurry vision. Prior partial right lung resection.  EXAM: CHEST 2 VIEW  COMPARISON: Chest radiograph 04/06/2014  FINDINGS: Stable cardiac and mediastinal contours. Interval increase in diffuse bilateral coarse interstitial thickening. Subpleural reticulation peripherally  is re- demonstrated. No pleural effusion or pneumothorax.  IMPRESSION: Interval increase in diffuse bilateral coarse interstitial thickening superimposed upon previously identified subpleural reticulation. Findings may represent edema or potentially an atypical infectious process.   Electronically Signed By: Annia Belt M.D. On: 11/03/2014 20:08         INITIAL IMPRESSION / ASSESSMENT AND PLAN / ED COURSE  Pertinent labs & imaging results that were available during my care of the patient were reviewed by me and considered in my medical decision making (see chart for details).  Patient' NIHSS score 0 as such TPA was not administered. Patient discussed dr Rockey Situ neurologists on call who agreed with hospitalization admission for further evaluation with MRI ____________________________________________   FINAL CLINICAL IMPRESSION(S) / ED DIAGNOSES  Final diagnoses:  Stroke  Stroke  Stroke      Darci Current, MD 11/04/14 [phone]

## 2014-11-04 ENCOUNTER — Observation Stay: Payer: BLUE CROSS/BLUE SHIELD

## 2014-11-04 ENCOUNTER — Observation Stay
Admit: 2014-11-04 | Discharge: 2014-11-04 | Disposition: A | Discharge status: Home / Self Care | Payer: BLUE CROSS/BLUE SHIELD | Attending: Internal Medicine | Admitting: Internal Medicine

## 2014-11-04 DIAGNOSIS — G458 Other transient cerebral ischemic attacks and related syndromes: Secondary | ICD-10-CM

## 2014-11-04 DIAGNOSIS — G43511 Persistent migraine aura without cerebral infarction, intractable, with status migrainosus: Secondary | ICD-10-CM | POA: Diagnosis not present

## 2014-11-04 DIAGNOSIS — G43409 Hemiplegic migraine, not intractable, without status migrainosus: Secondary | ICD-10-CM | POA: Diagnosis not present

## 2014-11-04 DIAGNOSIS — E785 Hyperlipidemia, unspecified: Secondary | ICD-10-CM | POA: Diagnosis present

## 2014-11-04 LAB — CBC
HCT: 38.6 % — ABNORMAL LOW (ref 40.0–52.0)
HEMOGLOBIN: 13.2 g/dL (ref 13.0–18.0)
MCH: 31 pg (ref 26.0–34.0)
MCHC: 34.2 g/dL (ref 32.0–36.0)
MCV: 90.5 fL (ref 80.0–100.0)
PLATELETS: 177 10*3/uL (ref 150–440)
RBC: 4.27 MIL/uL — ABNORMAL LOW (ref 4.40–5.90)
RDW: 13.2 % (ref 11.5–14.5)
WBC: 6 10*3/uL (ref 3.8–10.6)

## 2014-11-04 LAB — TROPONIN I: Troponin I: <0.03 ng/mL (ref <0.031)

## 2014-11-04 LAB — BASIC METABOLIC PANEL
ANION GAP: 6 (ref 5–15)
BUN: 10 mg/dL (ref 6–20)
CHLORIDE: 108 mmol/L (ref 101–111)
CO2: 27 mmol/L (ref 22–32)
CREATININE: 0.73 mg/dL (ref 0.61–1.24)
Calcium: 9 mg/dL (ref 8.9–10.3)
GFR calc non Af Amer: >60 mL/min (ref >60)
GLUCOSE: 122 mg/dL — AB (ref 65–99)
POTASSIUM: 4.2 mmol/L (ref 3.5–5.1)
Sodium: 141 mmol/L (ref 135–145)

## 2014-11-04 LAB — HEMOGLOBIN A1C: Hgb A1c MFr Bld: 6.1 % — ABNORMAL HIGH (ref 4.0–6.0)

## 2014-11-04 LAB — LIPID PANEL
Cholesterol: 222 mg/dL — ABNORMAL HIGH (ref 0–200)
HDL: 33 mg/dL — AB (ref >40)
LDL CALC: 144 mg/dL — AB (ref 0–99)
TRIGLYCERIDES: 227 mg/dL — AB (ref <150)
Total CHOL/HDL Ratio: 6.7 RATIO
VLDL: 45 mg/dL — AB (ref 0–40)

## 2014-11-04 LAB — TSH: TSH: 1.11 u[IU]/mL (ref 0.350–4.500)

## 2014-11-04 MED ORDER — GADOBENATE DIMEGLUMINE 529 MG/ML IV SOLN
20.0000 mL | Freq: Once | INTRAVENOUS | Status: AC | PRN
  Administered 2014-11-04: 10:00:00 20 mL via INTRAVENOUS

## 2014-11-04 MED ORDER — KETOROLAC TROMETHAMINE 30 MG/ML IJ SOLN
30.0000 mg | Freq: Four times a day (QID) | INTRAMUSCULAR | Status: DC | PRN
  Administered 2014-11-06: 30 mg via INTRAVENOUS
  Filled 2014-11-04: qty 1

## 2014-11-04 MED ORDER — MAGNESIUM SULFATE 2 GM/50ML IV SOLN
2.0000 g | Freq: Once | INTRAVENOUS | Status: AC
  Administered 2014-11-04: 15:00:00 2 g via INTRAVENOUS
  Filled 2014-11-04 (×2): qty 50

## 2014-11-04 MED ORDER — KETOROLAC TROMETHAMINE 15 MG/ML IJ SOLN
15.0000 mg | Freq: Once | INTRAMUSCULAR | Status: AC
  Administered 2014-11-04: 15 mg via INTRAVENOUS
  Filled 2014-11-04: qty 1

## 2014-11-04 MED ORDER — VALPROATE SODIUM 500 MG/5ML IV SOLN
500.0000 mg | Freq: Once | INTRAVENOUS | Status: AC
  Administered 2014-11-04: 500 mg via INTRAVENOUS
  Filled 2014-11-04 (×2): qty 5

## 2014-11-04 MED ORDER — ATORVASTATIN CALCIUM 40 MG PO TABS: 40.0000 mg | ORAL_TABLET | Freq: Every day | ORAL | Status: AC

## 2014-11-04 MED ORDER — SUMATRIPTAN SUCCINATE 6 MG/0.5ML ~~LOC~~ SOLN
6.0000 mg | Freq: Once | SUBCUTANEOUS | Status: AC
  Administered 2014-11-04: 6 mg via SUBCUTANEOUS
  Filled 2014-11-04: qty 0.5

## 2014-11-04 MED ORDER — ATORVASTATIN CALCIUM 20 MG PO TABS
40.0000 mg | ORAL_TABLET | Freq: Every day | ORAL | Status: DC
  Administered 2014-11-04 – 2014-11-06 (×3): 40 mg via ORAL
  Filled 2014-11-04 (×4): qty 2

## 2014-11-04 MED ORDER — ASPIRIN EC 81 MG PO TBEC: 81.0000 mg | DELAYED_RELEASE_TABLET | Freq: Every day | ORAL | Status: AC

## 2014-11-04 MED ORDER — SUMATRIPTAN SUCCINATE 6 MG/0.5ML ~~LOC~~ SOLN
12.0000 mg | Freq: Once | SUBCUTANEOUS | Status: DC
  Filled 2014-11-04: qty 1

## 2014-11-04 MED ORDER — DEXAMETHASONE SODIUM PHOSPHATE 10 MG/ML IJ SOLN
10.0000 mg | Freq: Once | INTRAMUSCULAR | Status: AC
  Administered 2014-11-04: 18:00:00 10 mg via INTRAVENOUS
  Filled 2014-11-04: qty 1

## 2014-11-04 NOTE — Evaluation (Signed)
Occupational Therapy Evaluation Patient Details Name: Matis Monnier MRN: [id number] DOB: 08-13-1965 Today's Date: 11/04/2014    History of Present Illness This patient is a 49 year old male who came to [facility] with left sided weakness.   Clinical Impression   This patient is a 49 year old male who came to [facility] left sided weakness. He does show left sided weakness, significant grip deficits and sensory deficits. He would benefit from Occupational Therapy for ADL/functioal mobility training and left upper extremity restoration.      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 He now needs supervision to contact guard assist during dressing for safety.      Vision     Perception     Praxis      Pertinent Vitals/Pain       Hand Dominance Right   Extremity/Trunk Assessment Upper Extremity Assessment Upper Extremity Assessment: LUE deficits/detail (R UE 5/5 through out grip 95 lbs.) LUE Deficits / Details: L UE shoulder flexion 4-/5 elbow forarm and wrist 4/5 grip 13 lbs.   Lower Extremity Assessment Lower Extremity Assessment: Defer to PT evaluation     Communication Communication Communication: No difficulties   Cognition Arousal/Alertness: Awake/alert Behavior During Therapy: WFL for tasks assessed/performed Overall Cognitive Status: Within Functional Limits for tasks assessed                     General  Comments   Left hand has significant sensory deficits in sharp, temp, light touch proprioception, and stereognosis. 9 hole peg test 38.75 on right and 38.76 on left. Note pulseox  taped on right thumb did slow right hand performance.    Exercises  Gave and practiced putty (mediam) for grip, pinch, finger adduction and finger extension.  Practiced card flipping with fingers only for fine motor.   Shoulder Instructions      Home Living Family/patient expects to be discharged to:: Private residence Living Arrangements: Spouse/significant other Available Help at Discharge: Family Type of Home: House Home Access: Stairs to enter Secretary/administrator of Steps: 3   Home Layout: One level           Bathroom Accessibility: Yes   Home Equipment: Walker - 2 wheels   Additional Comments: Pt reports that he is going to attempt work ASAP, which consists of climbing stairs/ladders.       Prior Functioning/Environment Level of Independence: Independent             OT Diagnosis: Paresis   OT Problem List: Decreased strength;Decreased  activity tolerance   OT Treatment/Interventions: Self-care/ADL training    OT Goals(Current goals can be found in the care plan section) Acute Rehab OT Goals Patient Stated Goal: to return to work OT Goal Formulation: With patient Time For Goal Achievement: 11/18/14 Potential to Achieve Goals: Good  OT Frequency: Min 1X/week   Barriers to D/C:            Co-evaluation              End of Session    Activity Tolerance:   Patient left: in bed;with call bell/phone within reach;with bed alarm set   Time: 1420-1441 OT Time Calculation (min): 21 min Charges:  OT General Charges $OT Visit: 1 Procedure OT Evaluation $Initial OT Evaluation Tier I: 1 Procedure OT Treatments $Therapeutic Exercise: 8-22 mins G-Codes:    Ocie Cornfield 2014-11-07, 2:54 PM

## 2014-11-04 NOTE — Evaluation (Signed)
Clinical/Bedside Swallow Evaluation Patient Details  Name: Kevin Boyd MRN: [id number] Date of Birth: 16-Jun-1965  Today's Date: 11/04/2014 Time: SLP Start Time (ACUTE ONLY): 1205 SLP Stop Time (ACUTE ONLY): 1300 SLP Time Calculation (min) (ACUTE ONLY): 55 min  Past Medical History:  Past Medical History  Diagnosis Date  . Sleep apnea   . Interstitial lung disease   . Bipolar 1 disorder   . GERD (gastroesophageal reflux disease)   . Seasonal allergies   . Scoliosis   . COPD (chronic obstructive pulmonary disease)   . Asthma   . C. difficile diarrhea   . Borderline diabetes mellitus    Past Surgical History:  Past Surgical History  Procedure Laterality Date  . Back surgery      lower lumbar fusion  . Thumb surgery    . Lung surgery      biopsy   HPI:  Pt is a 49 y/o male w/ h/o sleep apnea, COPD, interstitial lung dis., Bipolar Dis., GERD - on Nexium, morderline DM. Pt presented to the ED last pm w/ word finding deficits and Left sided weakness. He stated these symptoms resolved last evening but wife stated his speech sounds "heavy". Pt endorsed a "thick", dry feeling but better than last evening. Pt denied any trouble swallowing stating he ate a "dry biscuit this morning but it tasted aweful". MRI results were Negative for any acute abnormalities. Pt c/o continued HA(NSG aware and meds given) as well as bilateral tongue "tingling and numbness" this morning.    Assessment / Plan / Recommendation Clinical Impression  Pt appears to be tolerating his current regular consistency diet w/ no overt s/s of aspiration noted during BSE at this time. Pt consumed trials from his lunch meal w/ no overt oropharyngeal deficits noted. Pt fed self. Speech was clear and appropriate; 100% intelligible w/ no overt "thickness" of speech noted during conversation. Pt appears at reduced risk for aspiration w/ no overt receptive/expressive language deficits noted during conversation w/ pt, wife. Rec.  continue w/ current diet w/ general aspiration precautions as discussed; general Reflux precautions. Pt agreed. NSG updated.     Aspiration Risk   (reduced)    Diet Recommendation Age appropriate regular solids;Thin   Medication Administration: Whole meds with liquid Compensations: Slow rate;Small sips/bites    Other  Recommendations Oral Care Recommendations: Patient independent with oral care;Oral care BID   Follow Up Recommendations       Frequency and Duration        Pertinent Vitals/Pain Pt reported HA which NSG is aware of and has addressed w/ medication. NSG to f/u.     SLP Swallow Goals  n/a   Swallow Study Prior Functional Status  Type of Home: House Available Help at Discharge: Family    General Date of Onset: 11/03/14 Other Pertinent Information: Pt is a 49 y/o male w/ h/o sleep apnea, COPD, interstitial lung dis., Bipolar Dis., GERD - on Nexium, morderline DM. Pt presented to the ED last pm w/ word finding deficits and Left sided weakness. He stated these symptoms resolved last evening but wife stated his speech sounds "heavy". Pt endorsed a "thick", dry feeling but better than last evening. Pt denied any trouble swallowing stating he ate a "dry biscuit this morning but it tasted aweful". MRI results were Negative for any acute abnormalities. Pt c/o continued HA(NSG aware and meds given) as well as bilateral tongue "tingling and numbness" this morning.  Type of Study: Bedside swallow evaluation Previous Swallow Assessment: none Diet  Prior to this Study: Regular;Thin liquids Temperature Spikes Noted: No Respiratory Status: Room air History of Recent Intubation: No Behavior/Cognition: Alert;Cooperative Oral Cavity - Dentition: Adequate natural dentition/normal for age Self-Feeding Abilities: Able to feed self Patient Positioning: Upright in bed Baseline Vocal Quality: Normal Volitional Cough: Strong Volitional Swallow: Able to elicit    Oral/Motor/Sensory  Function Overall Oral Motor/Sensory Function: Appears within functional limits for tasks assessed Labial ROM: Within Functional Limits Labial Symmetry: Within Functional Limits Labial Strength: Within Functional Limits Lingual ROM: Within Functional Limits Lingual Symmetry: Within Functional Limits Lingual Strength: Within Functional Limits Lingual Sensation:  (tingling per pt) Facial ROM: Within Functional Limits Facial Symmetry: Within Functional Limits Mandible: Within Functional Limits   Ice Chips Ice chips: Not tested   Thin Liquid Thin Liquid: Within functional limits Presentation: Self Fed;Straw    Nectar Thick Nectar Thick Liquid: Not tested   Honey Thick Honey Thick Liquid: Not tested   Puree Puree: Within functional limits Presentation: Self Fed;Spoon Other Comments: 5 trials   Solid   GO Functional Assessment Tool Used: clinical judgement; BSE Functional Limitations: Swallowing Swallow Current Status (A5409): At least 1 percent but less than 20 percent impaired, limited or restricted (GERD) Swallow Goal Status (W1191): At least 1 percent but less than 20 percent impaired, limited or restricted (GERD) Swallow Discharge Status [phone]): At least 1 percent but less than 20 percent impaired, limited or restricted (GERD)   Solid: Within functional limits Presentation: Self Fed;Spoon Other Comments: 5 trials      Jerilynn Som, MS, CCC-SLP  Adiba Fargnoli 11/04/2014,3:02 PM

## 2014-11-04 NOTE — Progress Notes (Addendum)
[facility] [place] at [facility]   PATIENT NAME: Kevin Boyd    MR#:  [id number]  DATE OF BIRTH:  [birth year]  SUBJECTIVE:  CHIEF COMPLAINT:  No chief complaint on file.  headache and left extremity numbness.  REVIEW OF SYSTEMS:  CONSTITUTIONAL: Headache, No fever, fatigue or weakness.  EYES: No blurred or double vision.  EARS, NOSE, AND THROAT: No tinnitus or ear pain.  RESPIRATORY: No cough, shortness of breath, wheezing or hemoptysis.  CARDIOVASCULAR: No chest pain, orthopnea, edema.  GASTROINTESTINAL: No nausea, vomiting, diarrhea or abdominal pain.  GENITOURINARY: No dysuria, hematuria.  ENDOCRINE: No polyuria, nocturia,  HEMATOLOGY: No anemia, easy bruising or bleeding SKIN: No rash or lesion. MUSCULOSKELETAL: No joint pain or arthritis.   NEUROLOGIC: No tingling, numbness,  left upper and lower extremity numbness and weakness.  PSYCHIATRY: No anxiety or depression.   DRUG ALLERGIES:  No Known Allergies  VITALS:  Blood pressure 126/71, pulse 67, temperature 98.1 F (36.7 C), temperature source Oral, resp. rate 22, height 6\' 2"  (1.88 m), weight 132.45 kg (292 lb), SpO2 97 %.  PHYSICAL EXAMINATION:  GENERAL:  49 y.o.-year-old patient lying in the bed with no acute distress.  EYES: Pupils equal, round, reactive to light and accommodation. No scleral icterus. Extraocular muscles intact.  HEENT: Head atraumatic, normocephalic. Oropharynx and nasopharynx clear.  NECK:  Supple, no jugular venous distention. No thyroid enlargement, no tenderness.  LUNGS: Normal breath sounds bilaterally, no wheezing, rales,rhonchi or crepitation. No use of accessory muscles of respiration.  CARDIOVASCULAR: S1, S2 normal. No murmurs, rubs, or gallops.  ABDOMEN: Soft, nontender, nondistended. Bowel sounds present. No organomegaly or mass.  EXTREMITIES: No pedal edema, cyanosis, or clubbing.  NEUROLOGIC: Cranial nerves II through XII are intact. Muscle strength 5/5 in  right side extremities and 4/5 in left sided extremities  Sensation intact. Gait not checked.  PSYCHIATRIC: The patient is alert and oriented x 3.  SKIN: No obvious rash, lesion, or ulcer.    LABORATORY PANEL:   CBC  Recent Labs Lab 11/04/14 0509  WBC 6.0  HGB 13.2  HCT 38.6*  PLT 177   ------------------------------------------------------------------------------------------------------------------  Chemistries   Recent Labs Lab 11/04/14 0509  NA 141  K 4.2  CL 108  CO2 27  GLUCOSE 122*  BUN 10  CREATININE 0.73  CALCIUM 9.0   ------------------------------------------------------------------------------------------------------------------  Cardiac Enzymes  Recent Labs Lab 11/04/14 1048  TROPONINI <0.03   ------------------------------------------------------------------------------------------------------------------  RADIOLOGY:  Dg Chest 2 View  11/03/2014   CLINICAL DATA:  Patient with sweating, headache, blurry vision. Prior partial right lung resection.  EXAM: CHEST  2 VIEW  COMPARISON:  Chest radiograph 04/06/2014  FINDINGS: Stable cardiac and mediastinal contours. Interval increase in diffuse bilateral coarse interstitial thickening. Subpleural reticulation peripherally is re- demonstrated. No pleural effusion or pneumothorax.  IMPRESSION: Interval increase in diffuse bilateral coarse interstitial thickening superimposed upon previously identified subpleural reticulation. Findings may represent edema or potentially an atypical infectious process.   Electronically Signed   By: Annia Belt M.D.   On: 11/03/2014 20:08   Ct Head Wo Contrast  11/03/2014   CLINICAL DATA:  Headache.  10 min episode of difficulty speaking.  EXAM: CT HEAD WITHOUT CONTRAST  TECHNIQUE: Contiguous axial images were obtained from the base of the skull through the vertex without intravenous contrast.  COMPARISON:  None.  FINDINGS: No acute cortical infarct, hemorrhage, or mass lesion is  present. The ventricles are of normal size. No significant extra-axial fluid collection is evident.  The paranasal sinuses and mastoid air cells are clear. The calvarium is intact.  No significant extracranial soft tissue abnormality is present. The globes and orbits are intact.  IMPRESSION: Negative CT of the head.   Electronically Signed   By: Marin Roberts M.D.   On: 11/03/2014 20:30   Mr Angiogram Neck W Wo Contrast  11/04/2014   CLINICAL DATA:  Transient episode of word-finding difficulty last evening.  EXAM: MRA NECK WITHOUT AND WITH CONTRAST  TECHNIQUE: Multiplanar and multiecho pulse sequences of the neck were obtained without and with intravenous contrast. Angiographic images of the neck were obtained using MRA technique without and with intravenous contrast.  CONTRAST:  20mL MULTIHANCE GADOBENATE DIMEGLUMINE 529 MG/ML IV SOLN  COMPARISON:  CT of the head without contrast 11/03/2014.  FINDINGS: The time-of-flight images demonstrate no significant flow disturbance at either carotid bifurcation. Flow is antegrade in the vertebral arteries bilaterally.  Postcontrast images are somewhat degraded by patient motion.  A 3 vessel arch configuration is present. The right common carotid artery is within normal limits. The bifurcation is unremarkable. The cervical right ICA is normal.  The left common carotid artery is within normal limits. The bifurcation is within normal limits. Cervical left ICA is normal.  Both vertebral arteries originate from the subclavian arteries. Signal loss on the right is artifactual. The left vertebral artery is slightly dominant to the right. The PICA origins are not imaged.  IMPRESSION: 1. Normal variant MRA of the neck without significant stenosis or occlusion.   Electronically Signed   By: Marin Roberts M.D.   On: 11/04/2014 10:27   Mr Brain Wo Contrast  11/04/2014   CLINICAL DATA:  Word-finding difficulty beginning at 6:30 p.m. last evening. Mild left-sided weakness.  Blurred vision and headache.  EXAM: MRI HEAD WITHOUT CONTRAST  TECHNIQUE: Multiplanar, multiecho pulse sequences of the brain and surrounding structures were obtained without intravenous contrast.  COMPARISON:  CT head without contrast 11/03/2014.  FINDINGS: The diffusion-weighted images demonstrate no evidence for acute or subacute infarction. Acute hemorrhage or mass lesion is present. The ventricles are of normal size. Scattered subcortical T2 hyperintensities in the frontal lobes bilaterally are slightly greater than expected for age. Minimal periventricular T2 changes are present as well. No significant extraaxial fluid collection is present.  Flow is present in the major intracranial arteries. The globes and orbits are intact. Minimal mucosal thickening is present in the ethmoid air cells. The remaining paranasal sinuses and the mastoid air cells are clear.  The skullbase is within normal limits. Midline structures are unremarkable.  IMPRESSION: 1. No acute intracranial abnormality.  Next item . 2. Scattered subcortical T2 hyperintensities are slightly greater than expected for age. The finding is nonspecific but can be seen in the setting of chronic microvascular ischemia, a demyelinating process such as multiple sclerosis, vasculitis, complicated migraine headaches, or as the sequelae of a prior infectious or inflammatory process   Electronically Signed   By: Marin Roberts M.D.   On: 11/04/2014 10:21   Mr Palma Holter  11/04/2014   CLINICAL DATA:  Difficulty with word-finding thumb last evening.  EXAM: MRA HEAD WITHOUT CONTRAST  TECHNIQUE: Angiographic images of the Circle of Willis were obtained using MRA technique without intravenous contrast.  COMPARISON:  CT head without contrast 11/03/2014.  FINDINGS: Internal carotid arteries are within normal limits from the high cervical segments through the ICA termini. The A1 and M1 segments are normal. The anterior communicating artery is patent. The MCA  bifurcations are  intact. ACA and MCA branch vessels are within normal limits.  The left vertebral artery is slightly dominant to the right. The PICA origins are below the field of view. The vertebrobasilar junction is within normal limits. Both posterior cerebral arteries originate from the basilar tip. The PCA branch vessels are within normal limits.  IMPRESSION: Normal variant circle of Willis without evidence for significant proximal stenosis, aneurysm, or branch vessel occlusion.   Electronically Signed   By: Marin Roberts M.D.   On: 11/04/2014 10:24    EKG:   Orders placed or performed during the hospital encounter of 11/03/14  . ED EKG within 10 minutes  . ED EKG within 10 minutes  . EKG 12-Lead  . EKG 12-Lead    ASSESSMENT AND PLAN:    Complicated/hemiplegic migraine.   Per Dr. Loretha Brasil,  2 more grams of Mg - VPA 500 mg x1, monitor overnight.  PT suggested outpatient PT.  Hyperlipidemia. LDL 144. Continue aspirin and add Lipitor. I advised the patient exercise and diet control.  Interstitial lung disease. Stable.  I discussed with  Dr. Loretha Brasil.  All the records are reviewed and case discussed with Care Management/Social Workerr. Management plans discussed with the patient, family and they are in agreement. >50% time spent on counselling and coordination of care. CODE STATUS: full code  TOTAL TIME TAKING CARE OF THIS PATIENT: 36 minutes.   POSSIBLE D/C IN 1-2 DAYS, DEPENDING ON CLINICAL CONDITION.   Shaune Pollack M.D on 11/04/2014 at 3:29 PM  Between 7am to 6pm - Pager - [phone]  After 6pm go to [email or web address] - password [email or web address] [facility]  [place]  [facility]  Office  [phone]  CC: Primary care physician; Marisue Ivan, MD

## 2014-11-04 NOTE — Progress Notes (Signed)
*  PRELIMINARY RESULTS* Echocardiogram 2D Echocardiogram has been performed.  Echo done portable.  Kevin Boyd Hege 11/04/2014, 1:49 PM

## 2014-11-04 NOTE — Evaluation (Signed)
Physical Therapy Evaluation Patient Details Name: Kevin Boyd MRN: [id number] DOB: [birth year] Today's Date: 11/04/2014   History of Present Illness  Pt presents with L sided weakness, admitted to hospital for suspected TIA, MRI/CT came back negative.  After consult with neurologists, he suspects complex migraines.    Clinical Impression  Pt demonstrated ability to independently complete bed mobility and standing/ambulation required min assist/RW for safety purposes.  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 Pt demonstrates loss of proprioception/stereognosis on L UE/LE as indicated by proprioception testing.  Pt was educated on ascending/descending stairs and to remain vigilant of L sided proprioceptive loss so that it remains protected.  Pt would benefit from skilled PT in order to increase standing balance, facilitate neuromuscular reeducation, and educate on implementing ADLs in a safe manner.        Follow Up Recommendations Outpatient PT    Equipment Recommendations       Recommendations for Other Services OT consult     Precautions / Restrictions Precautions Precautions: Fall Restrictions Weight Bearing Restrictions: No      Mobility  Bed Mobility Overal bed mobility: Independent                Transfers Overall transfer level: Needs assistance Equipment used: Rolling walker (2 wheeled) Transfers: Sit to/from Stand Sit to Stand: Min assist         General transfer comment: Min assist was for safety purposes, pt could complete standing transfer independently.  Ambulation/Gait Ambulation/Gait assistance: Min assist (For safety purposes.) Ambulation Distance (Feet): 200 Feet Assistive device: Rolling walker (2 wheeled);None Gait Pattern/deviations: WFL(Within Functional Limits)     General Gait Details: Pt presents with  bilat trunk lean ipsilateral to stance side. Pt presents with small step width/trunk ext but could be from prior level of function, otherwise normal.    Stairs Stairs: Yes Stairs assistance: Min assist Stair Management: One rail Right;No rails Number of Stairs: 10 General stair comments: Pt initiated stairs with step to pattern/handrail assist and progressed to step through pattern/no handrails with ascending/descending stairs. Pt didn't place his entir foot on the step which cause LOB, but was easily corrected with verbal cuing.   Wheelchair Mobility    Modified Rankin (Stroke Patients Only)       Balance Overall balance assessment: Needs assistance   Sitting balance-Leahy Scale: Normal     Standing balance support: Bilateral upper extremity supported (For safety purposes. ) Standing balance-Leahy Scale: Good                               Pertinent Vitals/Pain      Home Living Family/patient expects to be discharged to:: Private residence Living Arrangements: Spouse/significant other Available Help at Discharge: Family Type of Home: House Home Access: Stairs to enter   Secretary/administrator of Steps: 3 Home Layout: One level Home Equipment: Walker - 2 wheels (Has a RW but doesn't use it.  Pt doesn't need to use it.) Additional Comments: Pt reports that he is going to attempt work ASAP, which consists of climbing stairs/ladders.     Prior Function Level of Independence: Independent               Hand Dominance        Extremity/Trunk Assessment   Upper Extremity Assessment: Overall [facility]  for tasks assessed           Lower Extremity Assessment: Overall WFL for tasks assessed (L LE was weaker than R, but still WFL (minimum 4/5))      Cervical / Trunk Assessment: Normal  Communication   Communication: No difficulties  Cognition Arousal/Alertness: Awake/alert Behavior During Therapy: WFL for tasks assessed/performed Overall Cognitive  Status: Within Functional Limits for tasks assessed                      General Comments      Exercises Other Exercises Other Exercises: Seated thumb finding test/RAMPs/proprioceptive testing indicating loss of proprioception/stereognosis on L side at ankle joint and distally, and elbow joint and distally, proximal to elbow and knee is [facility]. Also, postive arm drift with eyes closed on L side. ( )   Other Exercises: Gross visual screen negative for head shaking nystgmus, negative skew but did notice decreased convergence of left eye.        Assessment/Plan    PT Assessment Patient needs continued PT services  PT Diagnosis Difficulty walking;Abnormality of gait   PT Problem List Decreased strength;Decreased balance;Decreased coordination;Decreased safety awareness;Impaired sensation  PT Treatment Interventions Stair training;Gait training;Functional mobility training;Therapeutic activities;Therapeutic exercise;Balance training   PT Goals (Current goals can be found in the Care Plan section) Acute Rehab PT Goals Patient Stated Goal: to return to work PT Goal Formulation: With patient/family Time For Goal Achievement: 11/18/14 Potential to Achieve Goals: Good    Frequency Min 2X/week   Barriers to discharge        Co-evaluation               End of Session Equipment Utilized During Treatment: Gait belt Activity Tolerance: Patient tolerated treatment well Patient left: in bed;with bed alarm set;with call bell/phone within reach;with family/visitor present           Time: 1135-1221 PT Time Calculation (min) (ACUTE ONLY): 46 min   Charges:         PT G Codes:        Angie Fava, SPT 11/04/2014 1:25 PM

## 2014-11-04 NOTE — Progress Notes (Signed)
Pt continues to have a headache after tylenol, per MD okay to give 15 mg toradol IV once, per telephone via Dr. Imogene Burn

## 2014-11-04 NOTE — Progress Notes (Addendum)
Pt reports he took his own medications for tonight, stated he had taken Nexium, Tegretol, Lamictol, and Esbriet. In unmarked pill containers, unable to verify. Called to verify with pharmacy if the medications he stated interacts with Imitrex at 2100, pharmacy stated no. Pt now has a change in symptoms, unble to move, or speak clearly, very slurred speech. Rapid response called, Dr. Anne Hahn called and came to bedside. Stat CT ordered. Wife called and notified change of condition.

## 2014-11-04 NOTE — Progress Notes (Signed)
Dr. Loretha Brasil notified H/A is still 10/10. Ordered Imitrex 6 mg now subcutaneous. If that does not help, give 12 mg of Imitrex four hours afterwards.

## 2014-11-04 NOTE — Discharge Instructions (Signed)
Low fat diet. Exercise and diet control.

## 2014-11-04 NOTE — Progress Notes (Signed)
Notify MD that patient continues to have headache with no improvement with tylenol and toradol, per MD give 10 mg IV decadron once. Call MD after 20:00 if headache persist. Spoke with [facility] via telephone.

## 2014-11-04 NOTE — Plan of Care (Signed)
Problem: Discharge/Transitional Outcomes Goal: Barriers To Progression Addressed/Resolved Outcome: Progressing Pt is from home with wife Felt symptoms begin in the evening  Of 8/3 Works in [employer]. Has fallen off ladders, High Fall Risk Hx of sleep apnea, interstitial lung disease, GERD, COPD, borderline DM, continue home medications    Goal: Other Discharge Outcomes/Goals Outcome: Progressing Patient is alert and oriented, c/o headache. Schedule aspirin given with improvement. NIH score of 1. Decreased sensation in the LUE and LLE. Pt has blurry vision, tongue and lips are numb. Weak hand grips and bilateral plantar and dorsiflexions.

## 2014-11-04 NOTE — Progress Notes (Signed)
Notified Dr. Imogene Burn verbally that patient has headache, tylenol given, pt is on carb controlled diet and fsbs and pt is not diabetic, Per MD it is okay to d/c carb controlled diet and FSBS. Verbal order via Imogene Burn.

## 2014-11-04 NOTE — Consult Note (Signed)
CC: L sided numbness.   HPI: Kevin Boyd is an 49 y.o. male presenting with dysarthric speech. L sided numbness as well as perioral numbness. Pt is complaining of diffuse pressure like HA  MRI brain no acute abnormality as well as vessel imaging.    Past Medical History  Diagnosis Date  . Sleep apnea   . Interstitial lung disease   . Bipolar 1 disorder   . GERD (gastroesophageal reflux disease)   . Seasonal allergies   . Scoliosis   . COPD (chronic obstructive pulmonary disease)   . Asthma   . C. difficile diarrhea   . Borderline diabetes mellitus     Past Surgical History  Procedure Laterality Date  . Back surgery      lower lumbar fusion  . Thumb surgery    . Lung surgery      biopsy    Family History  Problem Relation Age of Onset  . Pulmonary fibrosis Mother   . Sjogren's syndrome Mother   . Pulmonary fibrosis Maternal Grandmother     Social History:  reports that he quit smoking about 10 months ago. His smoking use included Cigarettes. He has a 60 pack-year smoking history. He has quit using smokeless tobacco. His alcohol and drug histories are not on file.  No Known Allergies  Medications: I have reviewed the patient's current medications.  ROS: History obtained from the patient  General ROS: negative for - chills, fatigue, fever, night sweats, weight gain or weight loss Psychological ROS: negative for - behavioral disorder, hallucinations, memory difficulties, mood swings or suicidal ideation Ophthalmic ROS: negative for - blurry vision, double vision, eye pain or loss of vision ENT ROS: negative for - epistaxis, nasal discharge, oral lesions, sore throat, tinnitus or vertigo Allergy and Immunology ROS: negative for - hives or itchy/watery eyes Hematological and Lymphatic ROS: negative for - bleeding problems, bruising or swollen lymph nodes Endocrine ROS: negative for - galactorrhea, hair pattern changes, polydipsia/polyuria or temperature  intolerance Respiratory ROS: negative for - cough, hemoptysis, shortness of breath or wheezing Cardiovascular ROS: negative for - chest pain, dyspnea on exertion, edema or irregular heartbeat Gastrointestinal ROS: negative for - abdominal pain, diarrhea, hematemesis, nausea/vomiting or stool incontinence Genito-Urinary ROS: negative for - dysuria, hematuria, incontinence or urinary frequency/urgency Musculoskeletal ROS: negative for - joint swelling or muscular weakness Neurological ROS: as noted in HPI Dermatological ROS: negative for rash and skin lesion changes  Physical Examination: Blood pressure 126/71, pulse 67, temperature 98.1 F (36.7 C), temperature source Oral, resp. rate 22, height 6\' 2"  (1.88 m), weight 132.45 kg (292 lb), SpO2 97 %.   Neurological Examination Mental Status: Alert, oriented, thought content appropriate.  Speech fluent without evidence of aphasia.  Able to follow 3 step commands without difficulty. Cranial Nerves: II: Discs flat bilaterally; Visual fields grossly normal, pupils equal, round, reactive to light and accommodation III,IV, VI: ptosis not present, extra-ocular motions intact bilaterally V,VII: smile symmetric, facial light touch sensation normal bilaterally VIII: hearing normal bilaterally IX,X: gag reflex present XI: bilateral shoulder shrug XII: midline tongue extension Motor: Right : Upper extremity   5/5    Left:     Upper extremity   5/5  Lower extremity   5/5     Lower extremity   5/5 Tone and bulk:normal tone throughout; no atrophy noted Sensory: decreased L side as well as perioral.  Deep Tendon Reflexes: 2+ and symmetric throughout Plantars: Right: downgoing   Left: downgoing Cerebellar: normal finger-to-nose, normal rapid  alternating movements and normal heel-to-shin test Gait: normal gait and station      Laboratory Studies:   Basic Metabolic Panel:  Recent Labs Lab 11/03/14 1939 11/04/14 0509  NA 135 141  K 3.6 4.2   CL 101 108  CO2 24 27  GLUCOSE 169* 122*  BUN 11 10  CREATININE 0.95 0.73  CALCIUM 8.9 9.0    Liver Function Tests: No results for input(s): AST, ALT, ALKPHOS, BILITOT, PROT, ALBUMIN in the last 168 hours. No results for input(s): LIPASE, AMYLASE in the last 168 hours. No results for input(s): AMMONIA in the last 168 hours.  CBC:  Recent Labs Lab 11/03/14 1939 11/04/14 0509  WBC 8.8 6.0  HGB 13.8 13.2  HCT 39.6* 38.6*  MCV 89.8 90.5  PLT 185 177    Cardiac Enzymes:  Recent Labs Lab 11/03/14 1939 11/03/14 2328 11/04/14 0509 11/04/14 1048  TROPONINI <0.03 <0.03 <0.03 <0.03    BNP: Invalid input(s): POCBNP  CBG:  Recent Labs Lab 11/03/14 2307  GLUCAP 119*    Microbiology: Results for orders placed or performed in visit on 01/06/13  Stool culture     Status: None   Collection Time: 01/06/13  6:30 AM  Result Value Ref Range Status   Micro Text Report   Final       ORGANISM 1                HEAVY GROWTH YEAST-ID CANDIDA ALBICANS   COMMENT                   NO SALMONELLA OR SHIGELLA ISOLATED   COMMENT                   NO PATHOGENIC E.COLI DETECTED   COMMENT                   NO CAMPYLOBACTER ANTIGEN DETECTED   ANTIBIOTIC                    ORG#1                                               Clostridium Difficile by PCR     Status: None   Collection Time: 01/06/13  6:30 AM  Result Value Ref Range Status   Micro Text Report   Final       COMMENT                   NEGATIVE-CLOS.DIFFICILE TOXIN NOT DETECTED BY PCR   ANTIBIOTIC                                                        Coagulation Studies: No results for input(s): LABPROT, INR in the last 72 hours.  Urinalysis: No results for input(s): COLORURINE, LABSPEC, PHURINE, GLUCOSEU, HGBUR, BILIRUBINUR, KETONESUR, PROTEINUR, UROBILINOGEN, NITRITE, LEUKOCYTESUR in the last 168 hours.  Invalid input(s): APPERANCEUR  Lipid Panel:     Component Value Date/Time   CHOL 222* 11/04/2014 0509    TRIG 227* 11/04/2014 0509   HDL 33* 11/04/2014 0509   CHOLHDL 6.7 11/04/2014 0509   VLDL 45* 11/04/2014 0509   LDLCALC 144*  11/04/2014 0509    HgbA1C: No results found for: HGBA1C  Urine Drug Screen:     Component Value Date/Time   LABOPIA NEGATIVE 12/28/2013 1043   LABBENZ NEGATIVE 12/28/2013 1043   AMPHETMU NEGATIVE 12/28/2013 1043   THCU NEGATIVE 12/28/2013 1043   LABBARB NEGATIVE 12/28/2013 1043    Alcohol Level: No results for input(s): ETH in the last 168 hours.  Other results: EKG: normal EKG, normal sinus rhythm, unchanged from previous tracings.  Imaging: Dg Chest 2 View  11/03/2014   CLINICAL DATA:  Patient with sweating, headache, blurry vision. Prior partial right lung resection.  EXAM: CHEST  2 VIEW  COMPARISON:  Chest radiograph 04/06/2014  FINDINGS: Stable cardiac and mediastinal contours. Interval increase in diffuse bilateral coarse interstitial thickening. Subpleural reticulation peripherally is re- demonstrated. No pleural effusion or pneumothorax.  IMPRESSION: Interval increase in diffuse bilateral coarse interstitial thickening superimposed upon previously identified subpleural reticulation. Findings may represent edema or potentially an atypical infectious process.   Electronically Signed   By: Annia Belt M.D.   On: 11/03/2014 20:08   Ct Head Wo Contrast  11/03/2014   CLINICAL DATA:  Headache.  10 min episode of difficulty speaking.  EXAM: CT HEAD WITHOUT CONTRAST  TECHNIQUE: Contiguous axial images were obtained from the base of the skull through the vertex without intravenous contrast.  COMPARISON:  None.  FINDINGS: No acute cortical infarct, hemorrhage, or mass lesion is present. The ventricles are of normal size. No significant extra-axial fluid collection is evident. The paranasal sinuses and mastoid air cells are clear. The calvarium is intact.  No significant extracranial soft tissue abnormality is present. The globes and orbits are intact.  IMPRESSION: Negative  CT of the head.   Electronically Signed   By: Marin Roberts M.D.   On: 11/03/2014 20:30   Mr Angiogram Neck W Wo Contrast  11/04/2014   CLINICAL DATA:  Transient episode of word-finding difficulty last evening.  EXAM: MRA NECK WITHOUT AND WITH CONTRAST  TECHNIQUE: Multiplanar and multiecho pulse sequences of the neck were obtained without and with intravenous contrast. Angiographic images of the neck were obtained using MRA technique without and with intravenous contrast.  CONTRAST:  20mL MULTIHANCE GADOBENATE DIMEGLUMINE 529 MG/ML IV SOLN  COMPARISON:  CT of the head without contrast 11/03/2014.  FINDINGS: The time-of-flight images demonstrate no significant flow disturbance at either carotid bifurcation. Flow is antegrade in the vertebral arteries bilaterally.  Postcontrast images are somewhat degraded by patient motion.  A 3 vessel arch configuration is present. The right common carotid artery is within normal limits. The bifurcation is unremarkable. The cervical right ICA is normal.  The left common carotid artery is within normal limits. The bifurcation is within normal limits. Cervical left ICA is normal.  Both vertebral arteries originate from the subclavian arteries. Signal loss on the right is artifactual. The left vertebral artery is slightly dominant to the right. The PICA origins are not imaged.  IMPRESSION: 1. Normal variant MRA of the neck without significant stenosis or occlusion.   Electronically Signed   By: Marin Roberts M.D.   On: 11/04/2014 10:27   Mr Brain Wo Contrast  11/04/2014   CLINICAL DATA:  Word-finding difficulty beginning at 6:30 p.m. last evening. Mild left-sided weakness. Blurred vision and headache.  EXAM: MRI HEAD WITHOUT CONTRAST  TECHNIQUE: Multiplanar, multiecho pulse sequences of the brain and surrounding structures were obtained without intravenous contrast.  COMPARISON:  CT head without contrast 11/03/2014.  FINDINGS: The diffusion-weighted images demonstrate  no evidence for acute or subacute infarction. Acute hemorrhage or mass lesion is present. The ventricles are of normal size. Scattered subcortical T2 hyperintensities in the frontal lobes bilaterally are slightly greater than expected for age. Minimal periventricular T2 changes are present as well. No significant extraaxial fluid collection is present.  Flow is present in the major intracranial arteries. The globes and orbits are intact. Minimal mucosal thickening is present in the ethmoid air cells. The remaining paranasal sinuses and the mastoid air cells are clear.  The skullbase is within normal limits. Midline structures are unremarkable.  IMPRESSION: 1. No acute intracranial abnormality.  Next item . 2. Scattered subcortical T2 hyperintensities are slightly greater than expected for age. The finding is nonspecific but can be seen in the setting of chronic microvascular ischemia, a demyelinating process such as multiple sclerosis, vasculitis, complicated migraine headaches, or as the sequelae of a prior infectious or inflammatory process   Electronically Signed   By: Marin Roberts M.D.   On: 11/04/2014 10:21   Mr Palma Holter  11/04/2014   CLINICAL DATA:  Difficulty with word-finding thumb last evening.  EXAM: MRA HEAD WITHOUT CONTRAST  TECHNIQUE: Angiographic images of the Circle of Willis were obtained using MRA technique without intravenous contrast.  COMPARISON:  CT head without contrast 11/03/2014.  FINDINGS: Internal carotid arteries are within normal limits from the high cervical segments through the ICA termini. The A1 and M1 segments are normal. The anterior communicating artery is patent. The MCA bifurcations are intact. ACA and MCA branch vessels are within normal limits.  The left vertebral artery is slightly dominant to the right. The PICA origins are below the field of view. The vertebrobasilar junction is within normal limits. Both posterior cerebral arteries originate from the basilar tip.  The PCA branch vessels are within normal limits.  IMPRESSION: Normal variant circle of Willis without evidence for significant proximal stenosis, aneurysm, or branch vessel occlusion.   Electronically Signed   By: Marin Roberts M.D.   On: 11/04/2014 10:24     Assessment/Plan:  49 y.o. male presenting with dysarthric speech. L sided numbness as well as perioral numbness. Pt is complaining of diffuse pressure like HA  MRI brain no acute abnormality as well as vessel imaging.    - 2 more grams of Mg - VPA 500 mg x1, pt does have hx of bipolar d/o - I think this is complicated/hemiplegic migraine.   11/04/2014, 1:17 PM

## 2014-11-04 NOTE — Plan of Care (Signed)
Problem: Discharge/Transitional Outcomes Goal: Independent mobility/functioning independent or with min Independent mobility/functioning independently or with minimal assistance  Outcome: Progressing Pt is alert and oriented x 4, c/o headache with no improvement following tylenol and toradol, MD aware, decadron given. Neurologist consulted with patient and would like patient to stay over night for observation, patient ambulated with physical therapy, on room air, fair appetite, no bm, up to bathroom to urinate, wife at bedside, vitals stable. Uneventful shift.

## 2014-11-05 ENCOUNTER — Observation Stay: Payer: BLUE CROSS/BLUE SHIELD

## 2014-11-05 DIAGNOSIS — Z87891 Personal history of nicotine dependence: Secondary | ICD-10-CM | POA: Diagnosis not present

## 2014-11-05 DIAGNOSIS — F319 Bipolar disorder, unspecified: Secondary | ICD-10-CM | POA: Diagnosis present

## 2014-11-05 DIAGNOSIS — G43909 Migraine, unspecified, not intractable, without status migrainosus: Secondary | ICD-10-CM | POA: Diagnosis present

## 2014-11-05 DIAGNOSIS — Z7982 Long term (current) use of aspirin: Secondary | ICD-10-CM | POA: Diagnosis not present

## 2014-11-05 DIAGNOSIS — G43409 Hemiplegic migraine, not intractable, without status migrainosus: Secondary | ICD-10-CM | POA: Diagnosis present

## 2014-11-05 DIAGNOSIS — M419 Scoliosis, unspecified: Secondary | ICD-10-CM | POA: Diagnosis present

## 2014-11-05 DIAGNOSIS — J45909 Unspecified asthma, uncomplicated: Secondary | ICD-10-CM | POA: Diagnosis present

## 2014-11-05 DIAGNOSIS — G43511 Persistent migraine aura without cerebral infarction, intractable, with status migrainosus: Secondary | ICD-10-CM | POA: Diagnosis not present

## 2014-11-05 DIAGNOSIS — Z79899 Other long term (current) drug therapy: Secondary | ICD-10-CM | POA: Diagnosis not present

## 2014-11-05 DIAGNOSIS — G4730 Sleep apnea, unspecified: Secondary | ICD-10-CM | POA: Diagnosis present

## 2014-11-05 DIAGNOSIS — R7309 Other abnormal glucose: Secondary | ICD-10-CM | POA: Diagnosis present

## 2014-11-05 DIAGNOSIS — K219 Gastro-esophageal reflux disease without esophagitis: Secondary | ICD-10-CM | POA: Diagnosis present

## 2014-11-05 DIAGNOSIS — J849 Interstitial pulmonary disease, unspecified: Secondary | ICD-10-CM | POA: Diagnosis present

## 2014-11-05 DIAGNOSIS — R471 Dysarthria and anarthria: Secondary | ICD-10-CM | POA: Diagnosis present

## 2014-11-05 DIAGNOSIS — E785 Hyperlipidemia, unspecified: Secondary | ICD-10-CM | POA: Diagnosis present

## 2014-11-05 DIAGNOSIS — J449 Chronic obstructive pulmonary disease, unspecified: Secondary | ICD-10-CM | POA: Diagnosis present

## 2014-11-05 LAB — URINE DRUG SCREEN, QUALITATIVE (ARMC ONLY)
Amphetamines, Ur Screen: NOT DETECTED
Barbiturates, Ur Screen: NOT DETECTED
Benzodiazepine, Ur Scrn: NOT DETECTED
CANNABINOID 50 NG, UR ~~LOC~~: NOT DETECTED
Cocaine Metabolite,Ur ~~LOC~~: NOT DETECTED
MDMA (ECSTASY) UR SCREEN: NOT DETECTED
Methadone Scn, Ur: NOT DETECTED
Opiate, Ur Screen: NOT DETECTED
PHENCYCLIDINE (PCP) UR S: NOT DETECTED
Tricyclic, Ur Screen: NOT DETECTED

## 2014-11-05 MED ORDER — FENTANYL CITRATE (PF) 100 MCG/2ML IJ SOLN
25.0000 ug | INTRAMUSCULAR | Status: DC | PRN
  Administered 2014-11-05 – 2014-11-06 (×8): 25 ug via INTRAVENOUS
  Filled 2014-11-05 (×10): qty 2

## 2014-11-05 MED ORDER — ACETAZOLAMIDE SODIUM 500 MG IJ SOLR
500.0000 mg | Freq: Once | INTRAMUSCULAR | Status: AC
  Administered 2014-11-05: 500 mg via INTRAVENOUS
  Filled 2014-11-05: qty 500

## 2014-11-05 MED ORDER — PIRFENIDONE 267 MG PO CAPS
2.0000 | ORAL_CAPSULE | Freq: Three times a day (TID) | ORAL | Status: DC
  Administered 2014-11-05 – 2014-11-06 (×6): 2 via ORAL
  Filled 2014-11-05: qty 1

## 2014-11-05 MED ORDER — VERAPAMIL HCL 120 MG PO TABS
120.0000 mg | ORAL_TABLET | ORAL | Status: DC
  Administered 2014-11-05 – 2014-11-06 (×2): 120 mg via ORAL
  Filled 2014-11-05 (×3): qty 1

## 2014-11-05 NOTE — Progress Notes (Signed)
Occupational Therapy Treatment Patient Details Name: Kevin Boyd MRN: [id number] DOB: Oct 29, 1965 Today's Date: 11/05/2014    History of present illness This patient is a 49 year old male who came to [facility] with left sided weakness.   OT comments  Patient's symptomes have increased.      Follow Up Recommendations       Equipment Recommendations       Recommendations for Other Services      Precautions / Restrictions Precautions Precautions: Fall Restrictions Weight Bearing Restrictions: No       Mobility Bed Mobility                 Transfers            Balance                     ADL                                                Vision                     Perception     Praxis      Cognition   Behavior During Therapy: WFL for tasks assessed/performed Overall Cognitive Status: Within Functional Limits for tasks assessed                       Extremity/Trunk Assessment               Exercises  Active Left upper extremity exercises shoulder flexion, external and internal rotation elbow flexion and extension, forearm supination and pronation, wrist flexion and extension, and hand flexion and extension 5 repetitions. Gripper completed, could only tolerate 11 lbs and 7 lbs x 15. Patient unable to squeeze gripper at higher resistance.  Shoulder Instructions       General Comments      Pertinent Vitals/ Pain          Home Living                                          Prior Functioning/Environment              Frequency       Progress Toward Goals  OT Goals(current goals can now be found in the care plan section)     Acute Rehab OT Goals Patient Stated Goal: to return to work  Plan      Co-evaluation                 End of Session Equipment Utilized During Treatment:  (putty, gripper)   Activity Tolerance Patient tolerated treatment well   Patient  Left in bed;with call bell/phone within reach;with bed alarm set;with family/visitor present   Nurse Communication          Time: 1552-1610 OT Time Calculation (min): 18 min  Charges: OT General Charges $OT Visit: 1 Procedure OT Treatments $Therapeutic Exercise: 8-22 mins  Gwyndolyn Kaufman, MS/OTR/L  11/05/2014, 4:21 PM

## 2014-11-05 NOTE — Plan of Care (Addendum)
Problem: Discharge/Transitional Outcomes Goal: Other Discharge Outcomes/Goals Outcome: Progressing Pt is alert and oriented, appears to be improving from episode last night. Negative CT, negative urine drug screen. Patient is experiencing numbness in LUE, LLE and left side of face.Pt has slurred speech Patient has a droop on the left side of his mouth, unable to move left leg. Swallow screen re-done and patient passed medication. Patient given Fentanyl for H/A with some relief.

## 2014-11-05 NOTE — Progress Notes (Signed)
Physical Therapy Treatment Patient Details Name: Kevin Boyd MRN: [id number] DOB: Mar 28, 1966 Today's Date: 11/05/2014    History of Present Illness This patient is a 49 year old male who came to [facility] with left sided weakness.    PT Comments    Pt reports feeling much weaker today with L sided weakness and presents with slurred speech, repeat MRI negative.  Pt was unable to complete AROM L heel slides/quad sets (measured at 1/5 MMT), was able to complete sit to stand transfer with 2+ mod assist/HHA and was able to stand with min assist/HHA.  Pt demonstrates a + Hoover's sign on L side and demonstrated L LE/UE at least 4-/5 with functional movements, such as transfers and ambulation (62ft). Pt required 2+ mod assist/HHA with ambulation, required tactile facilitation to improve stance time/step length on both sides.  Pt would benefit from skilled PT in order to increase L sided strength and improve with transfers/ambulation. Now recommending CIR secondary to change in L sided strength/dysarthria resulting in decrease mobility/increased level of assist, unsafe to return home.  Care management informed and aware.         Follow Up Recommendations  CIR     Equipment Recommendations  Rolling walker with 5" wheels    Recommendations for Other Services       Precautions / Restrictions Precautions Precautions: Fall Restrictions Weight Bearing Restrictions: No    Mobility  Bed Mobility Overal bed mobility: Independent                Transfers Overall transfer level: Needs assistance Equipment used: Rolling walker (2 wheeled) Transfers: Sit to/from Stand Sit to Stand: Mod assist         General transfer comment: Pt was able to transfer with 2+ mod assist for safety purposes, pt demonstrated 1-2/5 MMT with L LE in supine therex but was able to stand up with assist.   Ambulation/Gait Ambulation/Gait assistance: Mod assist   Assistive device: Rolling walker (2 wheeled) Gait  Pattern/deviations: WFL(Within Functional Limits)     General Gait Details: Pt unable to bring L foot forward independently, PT provided mod assist with foot to push pt's foot forwards.  Pt weakness on L LE caused his L stance time to be shortened, which hindered his R foot from moving forward, required tactile facilitation of R hip ant translation/L knee flex/and WB to L side (all mod assist) for R foot stepping.    Stairs            Wheelchair Mobility    Modified Rankin (Stroke Patients Only)       Balance Overall balance assessment: Needs assistance Sitting-balance support: Single extremity supported;No upper extremity supported Sitting balance-Leahy Scale: Good     Standing balance support: Single extremity supported;Bilateral upper extremity supported Standing balance-Leahy Scale: Fair                      Cognition Arousal/Alertness: Awake/alert Behavior During Therapy: WFL for tasks assessed/performed Overall Cognitive Status: Within Functional Limits for tasks assessed                      Exercises Other Exercises Other Exercises: Attempted supine L heel slides/quads sets, pt reports that he is unable to perform secondary to weakness.  Other Exercises: Sit to stand transfers from bed with 2+ mod assist, verbal cuing required to evenly disperse WB on both LE, pt was able to sit and stand 3x.    General Comments  Pertinent Vitals/Pain      Home Living                      Prior Function            PT Goals (current goals can now be found in the care plan section) Acute Rehab PT Goals Patient Stated Goal: to return to work PT Goal Formulation: With patient/family Time For Goal Achievement: 11/18/14 Potential to Achieve Goals: Fair Progress towards PT goals: Not progressing toward goals - comment (Pt has regressed from last session, pt reports L sided weakness in supine to be much weaker (1-2/5 LLE and 3-4/5 LUE).)     Frequency  7X/week    PT Plan      Co-evaluation             End of Session Equipment Utilized During Treatment: Gait belt Activity Tolerance: Patient tolerated treatment well Patient left: in bed;with bed alarm set;with call bell/phone within reach     Time: 1414-1446 PT Time Calculation (min) (ACUTE ONLY): 32 min  Charges:                       G CodesAngie Fava, SPT 11-12-2014 3:25 PM

## 2014-11-05 NOTE — Progress Notes (Signed)
Dr. Loretha Brasil notified about change in patient status, ordered Fentanyl 25 mcg q2 hrs PRN. No other additional recommendations at this time, continue to monitor.

## 2014-11-05 NOTE — Progress Notes (Signed)
Update:  Called by nursing for significant increase in pt neurologic symptoms: left hemiplegia, dysarthria, minimally responsive initially.  See prior nursing note for details.  Pt had taken some home meds (stated they were his normal prescribed nightly meds, but were in an unmarked bottle).  On examination he was arousable, dysarthric, oriented though mentating somewhat slowly.  STAT head CT showed no abnormality, and re-examination showed some improvement in cognitive function and ability to converse, with pt being more alert, but with persistent dysarthria and left hemiplegia.  Pt and wife inquired about possible transfer in AM.  I had a lengthy discussion with the pt and his wife about his workup, including neg MRI <24 hours prior.  Possibility of stroke is present but low given this neg workup.  Vital signs stable throughout the event.  Will increase frequency of neuro checks to Q2 hours again for duration of this shift to track continued improvement in symptoms, contact on call neurology for possible further recommendations, urine tox screen pending.  Kristeen Miss, MD [facility] 11/05/2014, 1:05 AM

## 2014-11-05 NOTE — Consult Note (Addendum)
CC: L sided numbness.   HPI: Kevin Boyd is an 49 y.o. male presenting with dysarthric speech. L sided numbness as well as perioral numbness. Pt is complaining of diffuse pressure like HA  MRI brain no acute abnormality as well as vessel imaging.    Overnight worsening headache and worsening neurological symptoms on L side with leg being weaker then the arm.   Past Medical History  Diagnosis Date  . Sleep apnea   . Interstitial lung disease   . Bipolar 1 disorder   . GERD (gastroesophageal reflux disease)   . Seasonal allergies   . Scoliosis   . COPD (chronic obstructive pulmonary disease)   . Asthma   . C. difficile diarrhea   . Borderline diabetes mellitus     Past Surgical History  Procedure Laterality Date  . Back surgery      lower lumbar fusion  . Thumb surgery    . Lung surgery      biopsy    Family History  Problem Relation Age of Onset  . Pulmonary fibrosis Mother   . Sjogren's syndrome Mother   . Pulmonary fibrosis Maternal Grandmother     Social History:  reports that he quit smoking about 10 months ago. His smoking use included Cigarettes. He has a 60 pack-year smoking history. He has quit using smokeless tobacco. His alcohol and drug histories are not on file.  No Known Allergies  Medications: I have reviewed the patient's current medications.  Physical Examination: Blood pressure 117/66, pulse 64, temperature 98.3 F (36.8 C), temperature source Oral, resp. rate 20, height 6\' 2"  (1.88 m), weight 132.45 kg (292 lb), SpO2 97 %.   Neurological Examination Mental Status: Alert, oriented, thought content appropriate.  Speech fluent without evidence of aphasia.  Able to follow 3 step commands without difficulty. Cranial Nerves: II: Discs flat bilaterally; Visual fields grossly normal, pupils equal, round, reactive to light and accommodation III,IV, VI: ptosis not present, extra-ocular motions intact bilaterally V,VII: L facial droop VIII: hearing  normal bilaterally IX,X: gag reflex present XI: bilateral shoulder shrug XII: midline tongue extension Motor: Right : Upper extremity   5/5    Left:     Upper extremity   4/5  Lower extremity   5/5     Lower extremity   3-/5 Tone and bulk:normal tone throughout; no atrophy noted Sensory: decreased L side as well as perioral.  Deep Tendon Reflexes: 2+ and symmetric throughout Plantars: Right: downgoing   Left: downgoing Cerebellar: normal finger-to-nose, normal rapid alternating movements and normal heel-to-shin test Gait: unable to test       Laboratory Studies:   Basic Metabolic Panel:  Recent Labs Lab 11/03/14 1939 11/04/14 0509  NA 135 141  K 3.6 4.2  CL 101 108  CO2 24 27  GLUCOSE 169* 122*  BUN 11 10  CREATININE 0.95 0.73  CALCIUM 8.9 9.0    Liver Function Tests: No results for input(s): AST, ALT, ALKPHOS, BILITOT, PROT, ALBUMIN in the last 168 hours. No results for input(s): LIPASE, AMYLASE in the last 168 hours. No results for input(s): AMMONIA in the last 168 hours.  CBC:  Recent Labs Lab 11/03/14 1939 11/04/14 0509  WBC 8.8 6.0  HGB 13.8 13.2  HCT 39.6* 38.6*  MCV 89.8 90.5  PLT 185 177    Cardiac Enzymes:  Recent Labs Lab 11/03/14 1939 11/03/14 2328 11/04/14 0509 11/04/14 1048  TROPONINI <0.03 <0.03 <0.03 <0.03    BNP: Invalid input(s): POCBNP  CBG:  Recent Labs Lab 11/03/14 2307  GLUCAP 119*    Microbiology: Results for orders placed or performed in visit on 01/06/13  Stool culture     Status: None   Collection Time: 01/06/13  6:30 AM  Result Value Ref Range Status   Micro Text Report   Final       ORGANISM 1                HEAVY GROWTH YEAST-ID CANDIDA ALBICANS   COMMENT                   NO SALMONELLA OR SHIGELLA ISOLATED   COMMENT                   NO PATHOGENIC E.COLI DETECTED   COMMENT                   NO CAMPYLOBACTER ANTIGEN DETECTED   ANTIBIOTIC                    ORG#1                                                Clostridium Difficile by PCR     Status: None   Collection Time: 01/06/13  6:30 AM  Result Value Ref Range Status   Micro Text Report   Final       COMMENT                   NEGATIVE-CLOS.DIFFICILE TOXIN NOT DETECTED BY PCR   ANTIBIOTIC                                                        Coagulation Studies: No results for input(s): LABPROT, INR in the last 72 hours.  Urinalysis: No results for input(s): COLORURINE, LABSPEC, PHURINE, GLUCOSEU, HGBUR, BILIRUBINUR, KETONESUR, PROTEINUR, UROBILINOGEN, NITRITE, LEUKOCYTESUR in the last 168 hours.  Invalid input(s): APPERANCEUR  Lipid Panel:     Component Value Date/Time   CHOL 222* 11/04/2014 0509   TRIG 227* 11/04/2014 0509   HDL 33* 11/04/2014 0509   CHOLHDL 6.7 11/04/2014 0509   VLDL 45* 11/04/2014 0509   LDLCALC 144* 11/04/2014 0509    HgbA1C:  Lab Results  Component Value Date   HGBA1C 6.1* 11/03/2014    Urine Drug Screen:      Component Value Date/Time   LABOPIA NONE DETECTED 11/05/2014 0016   LABBENZ NONE DETECTED 11/05/2014 0016   AMPHETMU NONE DETECTED 11/05/2014 0016   THCU NONE DETECTED 11/05/2014 0016   LABBARB NONE DETECTED 11/05/2014 0016    Alcohol Level: No results for input(s): ETH in the last 168 hours.  Other results: EKG: normal EKG, normal sinus rhythm, unchanged from previous tracings.  Imaging: Dg Chest 2 View  11/03/2014   CLINICAL DATA:  Patient with sweating, headache, blurry vision. Prior partial right lung resection.  EXAM: CHEST  2 VIEW  COMPARISON:  Chest radiograph 04/06/2014  FINDINGS: Stable cardiac and mediastinal contours. Interval increase in diffuse bilateral coarse interstitial thickening. Subpleural reticulation peripherally is re- demonstrated. No pleural effusion or pneumothorax.  IMPRESSION: Interval increase in diffuse bilateral coarse interstitial thickening superimposed upon  previously identified subpleural reticulation. Findings may represent edema or potentially  an atypical infectious process.   Electronically Signed   By: Annia Belt M.D.   On: 11/03/2014 20:08   Ct Head Wo Contrast  11/05/2014   CLINICAL DATA:  Acute onset of inability to move or speak. Initial encounter.  EXAM: CT HEAD WITHOUT CONTRAST  TECHNIQUE: Contiguous axial images were obtained from the base of the skull through the vertex without intravenous contrast.  COMPARISON:  CT of the head performed 11/03/2014, and MRI/MRA of the brain performed earlier today at 9:26 a.m.  FINDINGS: There is no evidence of acute infarction, mass lesion, or intra- or extra-axial hemorrhage on CT.  Mild periventricular white matter change likely reflects small vessel ischemic microangiopathy.  The posterior fossa, including the cerebellum, brainstem and fourth ventricle, is within normal limits. The third and lateral ventricles, and basal ganglia are unremarkable in appearance. The cerebral hemispheres are symmetric in appearance, with normal gray-white differentiation. No mass effect or midline shift is seen.  There is no evidence of fracture; visualized osseous structures are unremarkable in appearance. The orbits are within normal limits. The paranasal sinuses and mastoid air cells are well-aerated. No significant soft tissue abnormalities are seen.  IMPRESSION: 1. No acute intracranial pathology seen on CT. 2. Minimal small vessel ischemic microangiopathy.   Electronically Signed   By: Roanna Raider M.D.   On: 11/05/2014 00:48   Ct Head Wo Contrast  11/03/2014   CLINICAL DATA:  Headache.  10 min episode of difficulty speaking.  EXAM: CT HEAD WITHOUT CONTRAST  TECHNIQUE: Contiguous axial images were obtained from the base of the skull through the vertex without intravenous contrast.  COMPARISON:  None.  FINDINGS: No acute cortical infarct, hemorrhage, or mass lesion is present. The ventricles are of normal size. No significant extra-axial fluid collection is evident. The paranasal sinuses and mastoid air cells are  clear. The calvarium is intact.  No significant extracranial soft tissue abnormality is present. The globes and orbits are intact.  IMPRESSION: Negative CT of the head.   Electronically Signed   By: Marin Roberts M.D.   On: 11/03/2014 20:30   Mr Angiogram Neck W Wo Contrast  11/04/2014   CLINICAL DATA:  Transient episode of word-finding difficulty last evening.  EXAM: MRA NECK WITHOUT AND WITH CONTRAST  TECHNIQUE: Multiplanar and multiecho pulse sequences of the neck were obtained without and with intravenous contrast. Angiographic images of the neck were obtained using MRA technique without and with intravenous contrast.  CONTRAST:  20mL MULTIHANCE GADOBENATE DIMEGLUMINE 529 MG/ML IV SOLN  COMPARISON:  CT of the head without contrast 11/03/2014.  FINDINGS: The time-of-flight images demonstrate no significant flow disturbance at either carotid bifurcation. Flow is antegrade in the vertebral arteries bilaterally.  Postcontrast images are somewhat degraded by patient motion.  A 3 vessel arch configuration is present. The right common carotid artery is within normal limits. The bifurcation is unremarkable. The cervical right ICA is normal.  The left common carotid artery is within normal limits. The bifurcation is within normal limits. Cervical left ICA is normal.  Both vertebral arteries originate from the subclavian arteries. Signal loss on the right is artifactual. The left vertebral artery is slightly dominant to the right. The PICA origins are not imaged.  IMPRESSION: 1. Normal variant MRA of the neck without significant stenosis or occlusion.   Electronically Signed   By: Marin Roberts M.D.   On: 11/04/2014 10:27   Mr Brain Wo Contrast  11/04/2014   CLINICAL DATA:  Word-finding difficulty beginning at 6:30 p.m. last evening. Mild left-sided weakness. Blurred vision and headache.  EXAM: MRI HEAD WITHOUT CONTRAST  TECHNIQUE: Multiplanar, multiecho pulse sequences of the brain and surrounding  structures were obtained without intravenous contrast.  COMPARISON:  CT head without contrast 11/03/2014.  FINDINGS: The diffusion-weighted images demonstrate no evidence for acute or subacute infarction. Acute hemorrhage or mass lesion is present. The ventricles are of normal size. Scattered subcortical T2 hyperintensities in the frontal lobes bilaterally are slightly greater than expected for age. Minimal periventricular T2 changes are present as well. No significant extraaxial fluid collection is present.  Flow is present in the major intracranial arteries. The globes and orbits are intact. Minimal mucosal thickening is present in the ethmoid air cells. The remaining paranasal sinuses and the mastoid air cells are clear.  The skullbase is within normal limits. Midline structures are unremarkable.  IMPRESSION: 1. No acute intracranial abnormality.  Next item . 2. Scattered subcortical T2 hyperintensities are slightly greater than expected for age. The finding is nonspecific but can be seen in the setting of chronic microvascular ischemia, a demyelinating process such as multiple sclerosis, vasculitis, complicated migraine headaches, or as the sequelae of a prior infectious or inflammatory process   Electronically Signed   By: Marin Roberts M.D.   On: 11/04/2014 10:21   Mr Palma Holter  11/04/2014   CLINICAL DATA:  Difficulty with word-finding thumb last evening.  EXAM: MRA HEAD WITHOUT CONTRAST  TECHNIQUE: Angiographic images of the Circle of Willis were obtained using MRA technique without intravenous contrast.  COMPARISON:  CT head without contrast 11/03/2014.  FINDINGS: Internal carotid arteries are within normal limits from the high cervical segments through the ICA termini. The A1 and M1 segments are normal. The anterior communicating artery is patent. The MCA bifurcations are intact. ACA and MCA branch vessels are within normal limits.  The left vertebral artery is slightly dominant to the right. The  PICA origins are below the field of view. The vertebrobasilar junction is within normal limits. Both posterior cerebral arteries originate from the basilar tip. The PCA branch vessels are within normal limits.  IMPRESSION: Normal variant circle of Willis without evidence for significant proximal stenosis, aneurysm, or branch vessel occlusion.   Electronically Signed   By: Marin Roberts M.D.   On: 11/04/2014 10:24     Assessment/Plan:  50 y.o. male presenting with dysarthric speech. L sided numbness as well as perioral numbness. Pt is complaining of diffuse pressure like HA.  Overnight worsening of symptoms. New L sided hemiparesis with leg worse then arm along with dysarthric speech.   Worsening HA overnight despite trial with multiple treatments.  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   Based on literature In nearly all patients with hemiplegic migraine, the aura symptoms eventually resolve completely, though they may be prolonged. However, in rare cases, hemiplegic migraine leads to permanent neurologic deficits, cerebral infarction, cognitive decline, or death  Plan: - started fentanyl q 2 PRN with mild improvement of HA - Will start verapamil 120mg  daily - MRI brain stat to make sure there is no ischemia - Please call pain management for assistance d/w primary team.   - in rare circumstances it was shown that acetazolamide provided some relief will start 500 once as trial.    11/05/2014, 8:54 AM     Addendum: MRI repeat reviewed, prelim no acute ischemia.  Pauletta Browns

## 2014-11-05 NOTE — Progress Notes (Signed)
[facility] - [place] at [facility]   PATIENT NAME: Kevin Boyd    MR#:  [id number]  DATE OF BIRTH:  08/11/1965  SUBJECTIVE:  CHIEF COMPLAINT:  No chief complaint on file.  Overnight worsening of symptoms, severe headache, worse left extremity numbness and weakness. Mild slurred speech.  REVIEW OF SYSTEMS:  CONSTITUTIONAL: Headache, No fever, has headache and weakness.  EYES: No blurred or double vision.  EARS, NOSE, AND THROAT: No tinnitus or ear pain.  RESPIRATORY: No cough, shortness of breath, wheezing or hemoptysis.  CARDIOVASCULAR: No chest pain, orthopnea, edema.  GASTROINTESTINAL: No nausea, vomiting, diarrhea or abdominal pain.  GENITOURINARY: No dysuria, hematuria.  ENDOCRINE: No polyuria, nocturia,  HEMATOLOGY: No anemia, easy bruising or bleeding SKIN: No rash or lesion. MUSCULOSKELETAL: No joint pain or arthritis.   NEUROLOGIC: left upper and lower extremity numbness and weakness. Mild Dysarthria. PSYCHIATRY: No anxiety or depression.   DRUG ALLERGIES:  No Known Allergies  VITALS:  Blood pressure 117/66, pulse 64, temperature 98.3 F (36.8 C), temperature source Oral, resp. rate 20, height 6\' 2"  (1.88 m), weight 132.45 kg (292 lb), SpO2 97 %.  PHYSICAL EXAMINATION:  GENERAL:  49 y.o.-year-old patient lying in the bed with no acute distress.  EYES: Pupils equal, round, reactive to light and accommodation. No scleral icterus. Extraocular muscles intact.  HEENT: Head atraumatic, normocephalic. Oropharynx and nasopharynx clear.  NECK:  Supple, no jugular venous distention. No thyroid enlargement, no tenderness.  LUNGS: Normal breath sounds bilaterally, no wheezing, rales,rhonchi or crepitation. No use of accessory muscles of respiration.  CARDIOVASCULAR: S1, S2 normal. No murmurs, rubs, or gallops.  ABDOMEN: Soft, nontender, nondistended. Bowel sounds present. No organomegaly or mass.  EXTREMITIES: No pedal edema, cyanosis, or clubbing.   NEUROLOGIC: Cranial nerves II through XII are intact. Muscle strength 5/5 in right side extremities and 3/5 in left sided extremities  Sensation decreased on left upper and lower extremity. Gait not checked.  Mild Dysarthria. PSYCHIATRIC: The patient is alert and oriented x 3.  SKIN: No obvious rash, lesion, or ulcer.    LABORATORY PANEL:   CBC  Recent Labs Lab 11/04/14 0509  WBC 6.0  HGB 13.2  HCT 38.6*  PLT 177   ------------------------------------------------------------------------------------------------------------------  Chemistries   Recent Labs Lab 11/04/14 0509  NA 141  K 4.2  CL 108  CO2 27  GLUCOSE 122*  BUN 10  CREATININE 0.73  CALCIUM 9.0   ------------------------------------------------------------------------------------------------------------------  Cardiac Enzymes  Recent Labs Lab 11/04/14 1048  TROPONINI <0.03   ------------------------------------------------------------------------------------------------------------------  RADIOLOGY:  Dg Chest 2 View  11/03/2014   CLINICAL DATA:  Patient with sweating, headache, blurry vision. Prior partial right lung resection.  EXAM: CHEST  2 VIEW  COMPARISON:  Chest radiograph 04/06/2014  FINDINGS: Stable cardiac and mediastinal contours. Interval increase in diffuse bilateral coarse interstitial thickening. Subpleural reticulation peripherally is re- demonstrated. No pleural effusion or pneumothorax.  IMPRESSION: Interval increase in diffuse bilateral coarse interstitial thickening superimposed upon previously identified subpleural reticulation. Findings may represent edema or potentially an atypical infectious process.   Electronically Signed   By: Annia Belt M.D.   On: 11/03/2014 20:08   Ct Head Wo Contrast  11/05/2014   CLINICAL DATA:  Acute onset of inability to move or speak. Initial encounter.  EXAM: CT HEAD WITHOUT CONTRAST  TECHNIQUE: Contiguous axial images were obtained from the base of the  skull through the vertex without intravenous contrast.  COMPARISON:  CT of the head performed 11/03/2014, and MRI/MRA  of the brain performed earlier today at 9:26 a.m.  FINDINGS: There is no evidence of acute infarction, mass lesion, or intra- or extra-axial hemorrhage on CT.  Mild periventricular white matter change likely reflects small vessel ischemic microangiopathy.  The posterior fossa, including the cerebellum, brainstem and fourth ventricle, is within normal limits. The third and lateral ventricles, and basal ganglia are unremarkable in appearance. The cerebral hemispheres are symmetric in appearance, with normal gray-white differentiation. No mass effect or midline shift is seen.  There is no evidence of fracture; visualized osseous structures are unremarkable in appearance. The orbits are within normal limits. The paranasal sinuses and mastoid air cells are well-aerated. No significant soft tissue abnormalities are seen.  IMPRESSION: 1. No acute intracranial pathology seen on CT. 2. Minimal small vessel ischemic microangiopathy.   Electronically Signed   By: Roanna Raider M.D.   On: 11/05/2014 00:48   Ct Head Wo Contrast  11/03/2014   CLINICAL DATA:  Headache.  10 min episode of difficulty speaking.  EXAM: CT HEAD WITHOUT CONTRAST  TECHNIQUE: Contiguous axial images were obtained from the base of the skull through the vertex without intravenous contrast.  COMPARISON:  None.  FINDINGS: No acute cortical infarct, hemorrhage, or mass lesion is present. The ventricles are of normal size. No significant extra-axial fluid collection is evident. The paranasal sinuses and mastoid air cells are clear. The calvarium is intact.  No significant extracranial soft tissue abnormality is present. The globes and orbits are intact.  IMPRESSION: Negative CT of the head.   Electronically Signed   By: Marin Roberts M.D.   On: 11/03/2014 20:30   Mr Angiogram Neck W Wo Contrast  11/04/2014   CLINICAL DATA:  Transient  episode of word-finding difficulty last evening.  EXAM: MRA NECK WITHOUT AND WITH CONTRAST  TECHNIQUE: Multiplanar and multiecho pulse sequences of the neck were obtained without and with intravenous contrast. Angiographic images of the neck were obtained using MRA technique without and with intravenous contrast.  CONTRAST:  20mL MULTIHANCE GADOBENATE DIMEGLUMINE 529 MG/ML IV SOLN  COMPARISON:  CT of the head without contrast 11/03/2014.  FINDINGS: The time-of-flight images demonstrate no significant flow disturbance at either carotid bifurcation. Flow is antegrade in the vertebral arteries bilaterally.  Postcontrast images are somewhat degraded by patient motion.  A 3 vessel arch configuration is present. The right common carotid artery is within normal limits. The bifurcation is unremarkable. The cervical right ICA is normal.  The left common carotid artery is within normal limits. The bifurcation is within normal limits. Cervical left ICA is normal.  Both vertebral arteries originate from the subclavian arteries. Signal loss on the right is artifactual. The left vertebral artery is slightly dominant to the right. The PICA origins are not imaged.  IMPRESSION: 1. Normal variant MRA of the neck without significant stenosis or occlusion.   Electronically Signed   By: Marin Roberts M.D.   On: 11/04/2014 10:27   Mr Brain Wo Contrast  11/05/2014   CLINICAL DATA:  Hemiparesis. Status change over night with left leg numbness and left hand numbness. Slurred speech.  EXAM: MRI HEAD WITHOUT CONTRAST  TECHNIQUE: Multiplanar, multiecho pulse sequences of the brain and surrounding structures were obtained without intravenous contrast.  COMPARISON:  Brain MRI from yesterday  FINDINGS: Calvarium and upper cervical spine: No focal marrow signal abnormality.  Orbits: No significant findings.  Sinuses and Mastoids: Clear. Mastoid and middle ears are clear.  Brain: No acute or remote infarct, hemorrhage, hydrocephalus, or  mass lesion. No evidence of large vessel occlusion.  Patchy T2 and FLAIR hypertense foci in the bilateral cerebral white matter, too numerous for age. In this patient with history of smoking and pre diabetes, favor chronic small vessel ischemia. Previous demyelination, vasculopathy, or complicated migraines could give a similar appearance.  Mega cisterna magna, incidental.  IMPRESSION: 1. No acute infarct or other explanation for new neurologic deficit. 2. Mild white matter disease, usually chronic small vessel ischemia.   Electronically Signed   By: Marnee Spring M.D.   On: 11/05/2014 10:42   Mr Brain Wo Contrast  11/04/2014   CLINICAL DATA:  Word-finding difficulty beginning at 6:30 p.m. last evening. Mild left-sided weakness. Blurred vision and headache.  EXAM: MRI HEAD WITHOUT CONTRAST  TECHNIQUE: Multiplanar, multiecho pulse sequences of the brain and surrounding structures were obtained without intravenous contrast.  COMPARISON:  CT head without contrast 11/03/2014.  FINDINGS: The diffusion-weighted images demonstrate no evidence for acute or subacute infarction. Acute hemorrhage or mass lesion is present. The ventricles are of normal size. Scattered subcortical T2 hyperintensities in the frontal lobes bilaterally are slightly greater than expected for age. Minimal periventricular T2 changes are present as well. No significant extraaxial fluid collection is present.  Flow is present in the major intracranial arteries. The globes and orbits are intact. Minimal mucosal thickening is present in the ethmoid air cells. The remaining paranasal sinuses and the mastoid air cells are clear.  The skullbase is within normal limits. Midline structures are unremarkable.  IMPRESSION: 1. No acute intracranial abnormality.  Next item . 2. Scattered subcortical T2 hyperintensities are slightly greater than expected for age. The finding is nonspecific but can be seen in the setting of chronic microvascular ischemia, a  demyelinating process such as multiple sclerosis, vasculitis, complicated migraine headaches, or as the sequelae of a prior infectious or inflammatory process   Electronically Signed   By: Marin Roberts M.D.   On: 11/04/2014 10:21   Mr Palma Holter  11/04/2014   CLINICAL DATA:  Difficulty with word-finding thumb last evening.  EXAM: MRA HEAD WITHOUT CONTRAST  TECHNIQUE: Angiographic images of the Circle of Willis were obtained using MRA technique without intravenous contrast.  COMPARISON:  CT head without contrast 11/03/2014.  FINDINGS: Internal carotid arteries are within normal limits from the high cervical segments through the ICA termini. The A1 and M1 segments are normal. The anterior communicating artery is patent. The MCA bifurcations are intact. ACA and MCA branch vessels are within normal limits.  The left vertebral artery is slightly dominant to the right. The PICA origins are below the field of view. The vertebrobasilar junction is within normal limits. Both posterior cerebral arteries originate from the basilar tip. The PCA branch vessels are within normal limits.  IMPRESSION: Normal variant circle of Willis without evidence for significant proximal stenosis, aneurysm, or branch vessel occlusion.   Electronically Signed   By: Marin Roberts M.D.   On: 11/04/2014 10:24    EKG:   Orders placed or performed during the hospital encounter of 11/03/14  . ED EKG within 10 minutes  . ED EKG within 10 minutes  . EKG 12-Lead  . EKG 12-Lead    ASSESSMENT AND PLAN:   1. Complicated/hemiplegic migraine.  Per Dr. Loretha Brasil,  started fentanyl q 2 PRN and verapamil  daily -MRI brain stat shows that there is no acute CVA. Pain control and requested pain management consult.   continue PT.  2. Hyperlipidemia. LDL 144. Continue aspirin and  added Lipitor. I advised the patient exercise and diet control.  Interstitial lung disease. Stable.  I discussed with  Dr.  Loretha Brasil.  All the records are reviewed and case discussed with Care Management/Social Workerr. Management plans discussed with the patient, family and they are in agreement. >50% time spent on counselling and coordination of care.  CODE STATUS: full code  TOTAL TIME TAKING CARE OF THIS PATIENT: 53 minutes.   POSSIBLE D/C IN 2 DAYS, DEPENDING ON CLINICAL CONDITION.   Shaune Pollack M.D on 11/05/2014 at 3:03 PM  Between 7am to 6pm - Pager - [phone]  After 6pm go to [email or web address] - password [email or web address] [facility]  [facility]  Office  [phone]  CC: Primary care physician; Marisue Ivan, MD

## 2014-11-05 NOTE — Care Management (Signed)
Physical therapy evaluation completed. Recommends Acute Inpatient Rehabilitation. Spoke with Kevin Boyd at the bedside. Would like to discuss this discharge option with wife before committing to a decision. Would like  [facility]. Also discussed skilled nursing facilities locally and going home with home health. Discussed that his insurance would have to approve acute care and SNF. Gwenette Greet RN MSN Care Management [phone]

## 2014-11-05 NOTE — Plan of Care (Signed)
Problem: Discharge/Transitional Outcomes Goal: Other Discharge Outcomes/Goals Outcome: Progressing Patient remains with slurred speech on and off throughout the day. Unable to move left leg at all. Left arm weak and strength impaired. Continues to complain of headache throughout the day and medicated twice with Fentanyl IV with relief. Eating well.

## 2014-11-06 ENCOUNTER — Inpatient Hospital Stay: Payer: BLUE CROSS/BLUE SHIELD

## 2014-11-06 MED ORDER — MAGNESIUM SULFATE 2 GM/50ML IV SOLN
2.0000 g | Freq: Once | INTRAVENOUS | Status: AC
  Administered 2014-11-06: 2 g via INTRAVENOUS
  Filled 2014-11-06: qty 50

## 2014-11-06 MED ORDER — PROMETHAZINE HCL 25 MG/ML IJ SOLN: 12.5000 mg | Freq: Four times a day (QID) | INTRAMUSCULAR | Status: DC | PRN

## 2014-11-06 MED ORDER — DIPHENHYDRAMINE HCL 50 MG/ML IJ SOLN
25.0000 mg | Freq: Once | INTRAMUSCULAR | Status: AC
  Administered 2014-11-06: 25 mg via INTRAVENOUS
  Filled 2014-11-06: qty 1

## 2014-11-06 MED ORDER — DEXTROSE 5 % IV SOLN
500.0000 mg | Freq: Once | INTRAVENOUS | Status: AC
  Administered 2014-11-06: 16:00:00 500 mg via INTRAVENOUS
  Filled 2014-11-06: qty 5

## 2014-11-06 NOTE — Plan of Care (Signed)
Problem: Discharge/Transitional Outcomes Goal: Other Discharge Outcomes/Goals Outcome: Progressing Plan of care progress to goal:  Headache/migraine:  Pt continues to have a headache with 9/10 pain. Fentanyl and toradol IV given PRN for pain, sleeping briefly after, but patient continues to have pain while awake. MD notified of no pain relief. Neurologist ordered MR Venogram. Magnesium IV given, pt put on 2L O2 [place] and benadryl given, all for hopeful relief of headache, per Dr. Elisabeth Pigeon. Order to transfer to [facility], per patient/family request. Waiting on call from [facility] for a bed.

## 2014-11-06 NOTE — Progress Notes (Signed)
[facility] - [place] at [facility]   PATIENT NAME: Kevin Boyd    MR#:  [id number]  DATE OF BIRTH:  [birth year]  SUBJECTIVE:  Still have headache 10/10, his left UL is improved, still does not have good grip by hand, but not able to move left lower limb, and speech is still altered.  REVIEW OF SYSTEMS:  CONSTITUTIONAL: severe Headache, No fever, has headache and weakness.  EYES: No blurred or double vision.  EARS, NOSE, AND THROAT: No tinnitus or ear pain.  RESPIRATORY: No cough, shortness of breath, wheezing or hemoptysis.  CARDIOVASCULAR: No chest pain, orthopnea, edema.  GASTROINTESTINAL: No nausea, vomiting, diarrhea or abdominal pain.  GENITOURINARY: No dysuria, hematuria.  ENDOCRINE: No polyuria, nocturia,  HEMATOLOGY: No anemia, easy bruising or bleeding SKIN: No rash or lesion. MUSCULOSKELETAL: No joint pain or arthritis.   NEUROLOGIC: left lower extremity numbness and weakness.left upper limb recovered some. Mild Dysarthria. PSYCHIATRY: No anxiety or depression.   DRUG ALLERGIES:  No Known Allergies  VITALS:  Blood pressure 135/75, pulse 65, temperature 97.8 F (36.6 C), temperature source Oral, resp. rate 20, height  (1.88 m), weight 132.45 kg (292 lb), SpO2 96 %.  PHYSICAL EXAMINATION:  GENERAL:  49 y.o.-year-old patient lying in the bed with no acute distress.  EYES: Pupils equal, round, reactive to light and accommodation. No scleral icterus. Extraocular muscles intact.  HEENT: Head atraumatic, normocephalic. Oropharynx and nasopharynx clear.  NECK:  Supple, no jugular venous distention. No thyroid enlargement, no tenderness.  LUNGS: Normal breath sounds bilaterally, no wheezing, rales,rhonchi or crepitation. No use of accessory muscles of respiration.  CARDIOVASCULAR: S1, S2 normal. No murmurs, rubs, or gallops.  ABDOMEN: Soft, nontender, nondistended. Bowel sounds present. No organomegaly or mass.  EXTREMITIES: No pedal edema,  cyanosis, or clubbing.  NEUROLOGIC: appears to have slight weakness on left side of face. Muscle strength 5/5 in right side extremities and 4/5 in left upper and 2/5 in LL extremities  Sensation decreased on left upper and lower extremity. Gait not checked.  Mild Dysarthria. PSYCHIATRIC: The patient is alert and oriented x 3.  SKIN: No obvious rash, lesion, or ulcer.    LABORATORY PANEL:   CBC  Recent Labs Lab 11/04/14 0509  WBC 6.0  HGB 13.2  HCT 38.6*  PLT 177   ------------------------------------------------------------------------------------------------------------------  Chemistries   Recent Labs Lab 11/04/14 0509  NA 141  K 4.2  CL 108  CO2 27  GLUCOSE 122*  BUN 10  CREATININE 0.73  CALCIUM 9.0   ------------------------------------------------------------------------------------------------------------------  Cardiac Enzymes  Recent Labs Lab 11/04/14 1048  TROPONINI <0.03   ------------------------------------------------------------------------------------------------------------------  RADIOLOGY:  Ct Head Wo Contrast  11/05/2014   CLINICAL DATA:  Acute onset of inability to move or speak. Initial encounter.  EXAM: CT HEAD WITHOUT CONTRAST  TECHNIQUE: Contiguous axial images were obtained from the base of the skull through the vertex without intravenous contrast.  COMPARISON:  CT of the head performed 11/03/2014, and MRI/MRA of the brain performed earlier today at 9:26 a.m.  FINDINGS: There is no evidence of acute infarction, mass lesion, or intra- or extra-axial hemorrhage on CT.  Mild periventricular white matter change likely reflects small vessel ischemic microangiopathy.  The posterior fossa, including the cerebellum, brainstem and fourth ventricle, is within normal limits. The third and lateral ventricles, and basal ganglia are unremarkable in appearance. The cerebral hemispheres are symmetric in appearance, with normal gray-white differentiation. No mass  effect or midline shift is seen.  There is  no evidence of fracture; visualized osseous structures are unremarkable in appearance. The orbits are within normal limits. The paranasal sinuses and mastoid air cells are well-aerated. No significant soft tissue abnormalities are seen.  IMPRESSION: 1. No acute intracranial pathology seen on CT. 2. Minimal small vessel ischemic microangiopathy.   Electronically Signed   By: Roanna Raider M.D.   On: 11/05/2014 00:48   Mr Brain Wo Contrast  11/05/2014   CLINICAL DATA:  Hemiparesis. Status change over night with left leg numbness and left hand numbness. Slurred speech.  EXAM: MRI HEAD WITHOUT CONTRAST  TECHNIQUE: Multiplanar, multiecho pulse sequences of the brain and surrounding structures were obtained without intravenous contrast.  COMPARISON:  Brain MRI from yesterday  FINDINGS: Calvarium and upper cervical spine: No focal marrow signal abnormality.  Orbits: No significant findings.  Sinuses and Mastoids: Clear. Mastoid and middle ears are clear.  Brain: No acute or remote infarct, hemorrhage, hydrocephalus, or mass lesion. No evidence of large vessel occlusion.  Patchy T2 and FLAIR hypertense foci in the bilateral cerebral white matter, too numerous for age. In this patient with history of smoking and pre diabetes, favor chronic small vessel ischemia. Previous demyelination, vasculopathy, or complicated migraines could give a similar appearance.  Mega cisterna magna, incidental.  IMPRESSION: 1. No acute infarct or other explanation for new neurologic deficit. 2. Mild white matter disease, usually chronic small vessel ischemia.   Electronically Signed   By: Marnee Spring M.D.   On: 11/05/2014 10:42    ASSESSMENT AND PLAN:   1. Complicated/hemiplegic migraine.  Per Dr. Loretha Brasil,  started fentanyl q 2 PRN and verapamil 120mg  daily  already on verapamin, toradol, tegretol- no response.  tried Steroid, valproic acid, acetazolamide- without help. -MRI  brain stat shows that there is no acute CVA. MRA neck and brain done- without acute findings. Pain control and requested pain management consult.   Pt and family request transfer- called [facility]- awaited response. continue PT.  2. Hyperlipidemia. LDL 144. Continue aspirin and added Lipitor. I advised the patient exercise and diet control.  3. Interstitial lung disease. Stable.  I discussed with  Dr. Loretha Brasil.  All the records are reviewed and case discussed with Care Management/Social Workerr. Management plans discussed with the patient, family and they are in agreement. >50% time spent on counselling and coordination of care.  CODE STATUS: full code  TOTAL TIME TAKING CARE OF THIS PATIENT: 53 minutes.   POSSIBLE D/C IN 2 DAYS, DEPENDING ON CLINICAL CONDITION.   Altamese Dilling M.D on 11/06/2014 at 1:11 PM  Between 7am to 6pm - Pager - [phone]  After 6pm go to [email or web address] - password [email or web address] [facility]  [place]  [facility]  Office  [phone]  CC: Primary care physician; Marisue Ivan, MD

## 2014-11-06 NOTE — Progress Notes (Signed)
Neurology:  Pt is still complaining of 101/10 headache pressure like diffuse along with weakness on L side.  Do suspect this is hemiplegic migraine.  Multiple imaging studies done.   Plan today: MRV head to look for sinus thrombosis VPA 500 IV  Benadryl Phenergan Transfer to [facility].  Pauletta Browns

## 2014-11-06 NOTE — Progress Notes (Signed)
[facility] neurologist on call- Dr. Lorenso Courier.  They don't have migrain specialist, and he has gone thjrough the management options with me on phone for migrain..  We tried most of them except benadryl, phenergan and keppra. He suggest to try that.  Spoke to Dr. Madalyn Rob. Will try that.  Will call [facility] for transfer.  Additional time spent for this is 40 min.

## 2014-11-06 NOTE — Plan of Care (Signed)
Problem: Discharge/Transitional Outcomes Goal: Other Discharge Outcomes/Goals Outcome: Progressing Patient remains with slurred speech off and on throughout shift. Unable to move left leg at all. Left arm weak and strength impaired. Continues to complain of headache PRN Fentanyl IV provides temporary  relief.

## 2014-11-06 NOTE — Care Management Note (Signed)
Case Management Note  Patient Details  Name: Kevin Boyd MRN: [id number] Date of Birth: 1965/12/13  Subjective/Objective:     Dr Kevin Boyd ordered transfer to [facility]. Accepting physician is Dr Kevin Boyd at [facility]. [facility] transfer coordinator ph:[phone] reported to this writer that no bed is currently available. This Marketing executive out a Occupational psychologist, H&P, Discharge Summary, Medical Necessity, Labs, CT of Head, and completed a Certification of Medical necessity for Non-Emergency Ambulance Transportation form, all of which were given to charge nurse Kevin Boyd. This Clinical research associate reported to charge nurse Kevin Boyd that an EMTALA would need to be completed by the transferring Kevin Boyd physician and [name]'s staff nurse and sent along with transfer packet to Kevin Boyd.               Action/Plan:   Expected Discharge Date:                  Expected Discharge Plan:     In-House Referral:     Discharge planning Services     Post Acute Care Choice:    Choice offered to:     DME Arranged:    DME Agency:     HH Arranged:    HH Agency:     Status of Service:     Medicare Important Message Given:    Date Medicare IM Given:    Medicare IM give by:    Date Additional Medicare IM Given:    Additional Medicare Important Message give by:     If discussed at Long Length of Stay Meetings, dates discussed:    Additional Comments:  Kevin Boyd A, RN 11/06/2014, 4:10 PM

## 2014-11-06 NOTE — Discharge Summary (Signed)
[facility] - [place] at [facility]   PATIENT NAME: Kevin Boyd    MR#:  [id number]  DATE OF BIRTH:  December 19, 1965  DATE OF ADMISSION:  11/03/2014 ADMITTING PHYSICIAN: Oralia Manis, MD  DATE OF DISCHARGE:11/06/2014  PRIMARY CARE PHYSICIAN: Marisue Ivan, MD    ADMISSION DIAGNOSIS:  dizzy and sweating  DISCHARGE DIAGNOSIS:  Principal Problem:   Migraine headache Active Problems:   Chest pain   GERD (gastroesophageal reflux disease)   Borderline diabetes mellitus   Bipolar 1 disorder   Hyperlipidemia   Migraine  Hemiplegic migraine.  SECONDARY DIAGNOSIS:   Past Medical History  Diagnosis Date  . Sleep apnea   . Interstitial lung disease   . Bipolar 1 disorder   . GERD (gastroesophageal reflux disease)   . Seasonal allergies   . Scoliosis   . COPD (chronic obstructive pulmonary disease)   . Asthma   . C. difficile diarrhea   . Borderline diabetes mellitus     HOSPITAL COURSE:   1. Complicated/hemiplegic migraine.  Per Dr. Loretha Brasil,  started fentanyl q 2 PRN and verapamil 120mg  daily already on verapamin, toradol, tegretol- no response. tried Steroid, valproic acid, acetazolamide- without help. -MRI brain stat shows that there is no acute CVA. MRA neck and brain done- without acute findings. Pain control and requested pain management consult.  Pt and family request transfer- called [facility]- they denied transfer as they have nothing new to offer him.  I spoke to [facility] transfer center and Dr. Lequita Halt lander accepted pt, he will be transferred there. continue PT.  2. Hyperlipidemia. LDL 144. Continue aspirin and added Lipitor. I advised the patient exercise and diet control.  3. Interstitial lung disease. Stable.  DISCHARGE CONDITIONS:   Stable.  CONSULTS OBTAINED:  Treatment Team:  Pauletta Browns, MD  DRUG ALLERGIES:  No Known Allergies  DISCHARGE MEDICATIONS:   Current Discharge Medication List    START  taking these medications   Details  atorvastatin (LIPITOR) 40 MG tablet Take 1 tablet (40 mg total) by mouth daily at 6 PM. Qty: 30 tablet, Refills: 0      CONTINUE these medications which have CHANGED   Details  aspirin EC 81 MG tablet Take 1 tablet (81 mg total) by mouth daily. Qty: 30 tablet, Refills: 0      CONTINUE these medications which have NOT CHANGED   Details  albuterol (PROVENTIL HFA;VENTOLIN HFA) 108 (90 BASE) MCG/ACT inhaler Inhale 2 puffs into the lungs every 6 (six) hours as needed for wheezing or shortness of breath.    Bioflavonoid Products (ESTER C PO) Take 1 tablet by mouth daily.    carbamazepine (TEGRETOL XR) 100 MG 12 hr tablet Take 100-200 mg by mouth 2 (two) times daily. 1 tab qam, 2 tabs qhs    cetirizine (ZYRTEC) 10 MG tablet Take 10 mg by mouth daily.    Cholecalciferol (VITAMIN D-3) 5000 UNITS TABS Take 1 tablet by mouth daily.    docusate sodium (COLACE) 100 MG capsule Take 100 mg by mouth at bedtime.    esomeprazole (NEXIUM) 40 MG capsule Take 1 capsule (40 mg total) by mouth 2 (two) times daily before a meal. Qty: 180 capsule, Refills: 3    ferrous gluconate (FERGON) 324 MG tablet Take 324 mg by mouth every other day. At night    lamoTRIgine (LAMICTAL) 200 MG tablet Take 200 mg by mouth 2 (two) times daily.    Pirfenidone 267 MG CAPS Take 2 tablets by mouth 3 (three)  times daily.     Probiotic Product (PROBIOTIC DAILY PO) Take 1 tablet by mouth daily.    fluticasone (FLONASE) 50 MCG/ACT nasal spray Place 2 sprays into both nostrils daily.         DISCHARGE INSTRUCTIONS:    Follow as per advised from [facility] physician.  If you experience worsening of your admission symptoms, develop shortness of breath, life threatening emergency, suicidal or homicidal thoughts you must seek medical attention immediately by calling 911 or calling your MD immediately  if symptoms less severe.  You Must read complete instructions/literature along with all  the possible adverse reactions/side effects for all the Medicines you take and that have been prescribed to you. Take any new Medicines after you have completely understood and accept all the possible adverse reactions/side effects.   Please note  You were cared for by a hospitalist during your hospital stay. If you have any questions about your discharge medications or the care you received while you were in the hospital after you are discharged, you can call the unit and asked to speak with the hospitalist on call if the hospitalist that took care of you is not available. Once you are discharged, your primary care physician will handle any further medical issues. Please note that NO REFILLS for any discharge medications will be authorized once you are discharged, as it is imperative that you return to your primary care physician (or establish a relationship with a primary care physician if you do not have one) for your aftercare needs so that they can reassess your need for medications and monitor your lab values.    Today   CHIEF COMPLAINT:  No chief complaint on file.   HISTORY OF PRESENT ILLNESS:  Kevin Boyd  is a 49 y.o. male who presents with complaint of multiple potential stroke symptoms. Patient states that around 6:30 this evening he began having word finding difficulty concurrent with an episode of some diaphoresis. He states that he knew the words that he wanted to say, but was unable to get anything out of his mouth. He also states that he had some nausea along with his diaphoresis and some epigastric abdominal pain. He states that he had some mild left-sided weakness, and that his left arm was slow to respond. Sometime after this he began to have some blurred vision and a significant headache as well. He is taking a new medicine for his pulmonary interstitial fibrosis, and called his pulmonologist wondering if these were side effects of medication. His pulmonologist told him to come  to the ED to be evaluated as he did not feel these were medication side effects. Neurology consultation here in the ED recommended admission and workup for stroke. Patient's word finding difficulty resolved in the ED, that his headache persists as well as some sensory deficits, specifically perioral numbness.   VITAL SIGNS:  Blood pressure 135/75, pulse 65, temperature 97.8 F (36.6 C), temperature source Oral, resp. rate 20, height  (1.88 m), weight 132.45 kg (292 lb), SpO2 96 %.  I/O:   Intake/Output Summary (Last 24 hours) at 11/06/14 1437 Last data filed at 11/06/14 1200  Gross per 24 hour  Intake    720 ml  Output    400 ml  Net    320 ml    PHYSICAL EXAMINATION:   GENERAL: 49 y.o.-year-old patient lying in the bed with no acute distress.  EYES: Pupils equal, round, reactive to light and accommodation. No scleral icterus. Extraocular muscles intact.  HEENT: Head atraumatic, normocephalic. Oropharynx and nasopharynx clear.  NECK: Supple, no jugular venous distention. No thyroid enlargement, no tenderness.  LUNGS: Normal breath sounds bilaterally, no wheezing, rales,rhonchi or crepitation. No use of accessory muscles of respiration.  CARDIOVASCULAR: S1, S2 normal. No murmurs, rubs, or gallops.  ABDOMEN: Soft, nontender, nondistended. Bowel sounds present. No organomegaly or mass.  EXTREMITIES: No pedal edema, cyanosis, or clubbing.  NEUROLOGIC: appears to have slight weakness on left side of face. Muscle strength 5/5 in right side extremities and 4/5 in left upper and 2/5 in LL extremities Sensation decreased on left upper and lower extremity. Gait not checked. Mild Dysarthria. PSYCHIATRIC: The patient is alert and oriented x 3.  SKIN: No obvious rash, lesion, or ulcer.   DATA REVIEW:   CBC  Recent Labs Lab 11/04/14 0509  WBC 6.0  HGB 13.2  HCT 38.6*  PLT 177    Chemistries   Recent Labs Lab 11/04/14 0509  NA 141  K 4.2  CL 108  CO2 27   GLUCOSE 122*  BUN 10  CREATININE 0.73  CALCIUM 9.0    Cardiac Enzymes  Recent Labs Lab 11/04/14 1048  TROPONINI <0.03    Microbiology Results  Results for orders placed or performed in visit on 01/06/13  Stool culture     Status: None   Collection Time: 01/06/13  6:30 AM  Result Value Ref Range Status   Micro Text Report   Final       ORGANISM 1                HEAVY GROWTH YEAST-ID CANDIDA ALBICANS   COMMENT                   NO SALMONELLA OR SHIGELLA ISOLATED   COMMENT                   NO PATHOGENIC E.COLI DETECTED   COMMENT                   NO CAMPYLOBACTER ANTIGEN DETECTED   ANTIBIOTIC                    ORG#1                                               Clostridium Difficile by PCR     Status: None   Collection Time: 01/06/13  6:30 AM  Result Value Ref Range Status   Micro Text Report   Final       COMMENT                   NEGATIVE-CLOS.DIFFICILE TOXIN NOT DETECTED BY PCR   ANTIBIOTIC                                                        RADIOLOGY:  Ct Head Wo Contrast  11/05/2014   CLINICAL DATA:  Acute onset of inability to move or speak. Initial encounter.  EXAM: CT HEAD WITHOUT CONTRAST  TECHNIQUE: Contiguous axial images were obtained from the base of the skull through the vertex without intravenous contrast.  COMPARISON:  CT of the head performed 11/03/2014,  and MRI/MRA of the brain performed earlier today at 9:26 a.m.  FINDINGS: There is no evidence of acute infarction, mass lesion, or intra- or extra-axial hemorrhage on CT.  Mild periventricular white matter change likely reflects small vessel ischemic microangiopathy.  The posterior fossa, including the cerebellum, brainstem and fourth ventricle, is within normal limits. The third and lateral ventricles, and basal ganglia are unremarkable in appearance. The cerebral hemispheres are symmetric in appearance, with normal gray-white differentiation. No mass effect or midline shift is seen.  There is no  evidence of fracture; visualized osseous structures are unremarkable in appearance. The orbits are within normal limits. The paranasal sinuses and mastoid air cells are well-aerated. No significant soft tissue abnormalities are seen.  IMPRESSION: 1. No acute intracranial pathology seen on CT. 2. Minimal small vessel ischemic microangiopathy.   Electronically Signed   By: Roanna Raider M.D.   On: 11/05/2014 00:48   Mr Brain Wo Contrast  11/05/2014   CLINICAL DATA:  Hemiparesis. Status change over night with left leg numbness and left hand numbness. Slurred speech.  EXAM: MRI HEAD WITHOUT CONTRAST  TECHNIQUE: Multiplanar, multiecho pulse sequences of the brain and surrounding structures were obtained without intravenous contrast.  COMPARISON:  Brain MRI from yesterday  FINDINGS: Calvarium and upper cervical spine: No focal marrow signal abnormality.  Orbits: No significant findings.  Sinuses and Mastoids: Clear. Mastoid and middle ears are clear.  Brain: No acute or remote infarct, hemorrhage, hydrocephalus, or mass lesion. No evidence of large vessel occlusion.  Patchy T2 and FLAIR hypertense foci in the bilateral cerebral white matter, too numerous for age. In this patient with history of smoking and pre diabetes, favor chronic small vessel ischemia. Previous demyelination, vasculopathy, or complicated migraines could give a similar appearance.  Mega cisterna magna, incidental.  IMPRESSION: 1. No acute infarct or other explanation for new neurologic deficit. 2. Mild white matter disease, usually chronic small vessel ischemia.   Electronically Signed   By: Marnee Spring M.D.   On: 11/05/2014 10:42    Management plans discussed with the patient, family and they are in agreement.  CODE STATUS:     Code Status Orders        Start     Ordered   11/03/14 2301  Full code   Continuous     11/03/14 2300      TOTAL TIME TAKING CARE OF THIS PATIENT: 35 minutes.    Altamese Dilling M.D on  11/06/2014 at 2:37 PM  Between 7am to 6pm - Pager - [phone]  After 6pm go to [email or web address] - password [email or web address] [facility]  [facility]  Office  [phone]  CC: Primary care physician; Marisue Ivan, MD

## 2014-11-06 NOTE — Progress Notes (Signed)
Later today, I spoke to Neurologist at [facility] and explained them about the case. They accepted the transfer, I did the required formalities and paer work for transfer. Talked to Pt about the acceptance.  Additional time spent in this is 35 min.

## 2014-11-06 NOTE — Progress Notes (Signed)
Report given to Marcelino Duster, Charity fundraiser at [facility]

## 2014-11-06 NOTE — Progress Notes (Signed)
Physical Therapy Treatment Patient Details Name: Kevin Boyd MRN: [id number] DOB: [birth year] Today's Date: 11/06/2014    History of Present Illness This patient is a 49 year old male who came to [facility] with left sided weakness.    PT Comments    Pt notes severe HA continues; notes just received pain medication and is feeling "loupy" and tired. Pt does not wish out of bed; agreeable to bed exercises. During course of treatment while questioning patient it is revealed pt received a head injury on Tuesday night before coming to the hospital on Wednesday. Pt notes as well as visiting brothers that this has not been mentioned to any medical personnel during his stay thus far. Pt noting to have some improvement in more proximal LLE strengthening exercises, but continues with no active movement in L distal LE from ankle and foot. Post session spoke with both assigned nurse and MD regarding noted head injury (Pt hit top on head on a cabinet; brothers note that patient hit head very hard; no black out noted, but significant in nature).   Follow Up Recommendations  CIR     Equipment Recommendations  Rolling walker with 5" wheels    Recommendations for Other Services       Precautions / Restrictions Precautions Precautions: Fall Restrictions Weight Bearing Restrictions: No    Mobility  Bed Mobility               General bed mobility comments: Nto tested; pt notes he just had medication and is feeling "loupy" and tired  Transfers                    Ambulation/Gait                 Stairs            Wheelchair Mobility    Modified Rankin (Stroke Patients Only)       Balance                                    Cognition Arousal/Alertness: Lethargic Behavior During Therapy: WFL for tasks assessed/performed Overall Cognitive Status: Within Functional Limits for tasks assessed                      Exercises General Exercises -  Lower Extremity Ankle Circles/Pumps: PROM;Left;20 reps;Supine (AROM on R) Quad Sets: Strengthening;Both;20 reps;Supine (Significantly decreased on L ) Gluteal Sets: Strengthening;Both;20 reps;Supine Short Arc Quad: AAROM;Left;20 reps;Supine Heel Slides: AAROM;Left;20 reps;Supine Hip ABduction/ADduction: AAROM;Left;20 reps;Supine Straight Leg Raises: AAROM;Left;20 reps;Supine    General Comments        Pertinent Vitals/Pain Pain Assessment: 0-10 Pain Score: 10-Worst pain ever (Pt actually rates HA a 12/10 pain) Pain Location: Head Pain Descriptors / Indicators: Constant Pain Intervention(s): Limited activity within patient's tolerance;Premedicated before session    Home Living                      Prior Function            PT Goals (current goals can now be found in the care plan section) Progress towards PT goals: Progressing toward goals    Frequency  7X/week    PT Plan Current plan remains appropriate    Co-evaluation             End of Session   Activity Tolerance: Patient tolerated treatment well;Patient limited  by fatigue Patient left: in bed;with call bell/phone within reach;with bed alarm set;with family/visitor present     Time: 9562-1308 PT Time Calculation (min) (ACUTE ONLY): 24 min  Charges:  $Therapeutic Exercise: 8-22 mins                    G Codes:      Kristeen Miss 11/06/2014, 2:41 PM

## 2014-11-10 ENCOUNTER — Other Ambulatory Visit: Payer: Self-pay

## 2014-11-10 MED ORDER — ESOMEPRAZOLE MAGNESIUM 40 MG PO CPDR: 40.0000 mg | DELAYED_RELEASE_CAPSULE | Freq: Two times a day (BID) | ORAL | Status: DC

## 2014-11-22 ENCOUNTER — Encounter: Payer: Self-pay | Admitting: Pulmonary Disease

## 2014-12-21 ENCOUNTER — Telehealth: Payer: Self-pay | Admitting: Pulmonary Disease

## 2014-12-21 NOTE — Telephone Encounter (Signed)
Called and spoke to pt. Pt is c/o insomnia, sleeping about 2 hours per night x 2 months. Pt stated he was informed by the pharmacist at the speciality pharmacy that Esbriet could be causing the insomnia. Pt is taking Esbriet 2 tabs TID. The pharmacist advised the pt to call to see if BQ recommends anything. Pt stated he was given Geodon by his PCP but has not noticed much of an approved. Pt has an upcoming appt with BQ on 10.20.16.  Dr. Kendrick Fries please advise. Thanks.

## 2014-12-22 NOTE — Telephone Encounter (Signed)
I would normally prescribe trazodone but I don't feel comfortable doing this if he is still taking Geodon Have him f/u with PCP to clarify the need for the Geodon first

## 2014-12-22 NOTE — Telephone Encounter (Signed)
Called spoke with pt. Made aware of below. Nothing further needed 

## 2015-01-19 ENCOUNTER — Other Ambulatory Visit: Payer: Self-pay | Admitting: Pulmonary Disease

## 2015-01-19 DIAGNOSIS — R0600 Dyspnea, unspecified: Secondary | ICD-10-CM

## 2015-01-20 ENCOUNTER — Ambulatory Visit (INDEPENDENT_AMBULATORY_CARE_PROVIDER_SITE_OTHER): Payer: BLUE CROSS/BLUE SHIELD | Admitting: Pulmonary Disease

## 2015-01-20 ENCOUNTER — Encounter: Payer: Self-pay | Admitting: Pulmonary Disease

## 2015-01-20 ENCOUNTER — Other Ambulatory Visit (INDEPENDENT_AMBULATORY_CARE_PROVIDER_SITE_OTHER): Payer: BLUE CROSS/BLUE SHIELD

## 2015-01-20 VITALS — BP 136/74 | HR 76 | Ht 74.0 in | Wt 285.0 lb

## 2015-01-20 DIAGNOSIS — J84112 Idiopathic pulmonary fibrosis: Secondary | ICD-10-CM

## 2015-01-20 DIAGNOSIS — Z5181 Encounter for therapeutic drug level monitoring: Secondary | ICD-10-CM

## 2015-01-20 DIAGNOSIS — R0600 Dyspnea, unspecified: Secondary | ICD-10-CM

## 2015-01-20 DIAGNOSIS — R0602 Shortness of breath: Secondary | ICD-10-CM

## 2015-01-20 LAB — PULMONARY FUNCTION TEST
DL/VA % pred: 85 %
DL/VA: 4.16 ml/min/mmHg/L
DLCO UNC % PRED: 54 %
DLCO unc: 20.55 ml/min/mmHg
FEF 25-75 PRE: 3.47 L/s
FEF 25-75 Post: 5.52 L/sec
FEF2575-%Change-Post: 58 %
FEF2575-%PRED-POST: 140 %
FEF2575-%PRED-PRE: 88 %
FEV1-%Change-Post: 13 %
FEV1-%Pred-Post: 77 %
FEV1-%Pred-Pre: 67 %
FEV1-POST: 3.47 L
FEV1-Pre: 3.05 L
FEV1FVC-%Change-Post: 5 %
FEV1FVC-%PRED-PRE: 106 %
FEV6-%CHANGE-POST: 8 %
FEV6-%PRED-POST: 70 %
FEV6-%Pred-Pre: 64 %
FEV6-POST: 3.96 L
FEV6-Pre: 3.64 L
FEV6FVC-%Pred-Post: 103 %
FEV6FVC-%Pred-Pre: 103 %
FVC-%Change-Post: 8 %
FVC-%PRED-POST: 68 %
FVC-%Pred-Pre: 63 %
FVC-Post: 3.96 L
FVC-Pre: 3.66 L
POST FEV1/FVC RATIO: 88 %
Post FEV6/FVC ratio: 100 %
Pre FEV1/FVC ratio: 83 %
Pre FEV6/FVC Ratio: 100 %
RV % PRED: 69 %
RV: 1.54 L
TLC % pred: 69 %
TLC: 5.38 L

## 2015-01-20 LAB — HEPATIC FUNCTION PANEL
ALK PHOS: 91 U/L (ref 39–117)
ALT: 25 U/L (ref 0–53)
AST: 20 U/L (ref 0–37)
Albumin: 4.2 g/dL (ref 3.5–5.2)
BILIRUBIN DIRECT: 0.1 mg/dL (ref 0.0–0.3)
TOTAL PROTEIN: 7.4 g/dL (ref 6.0–8.3)
Total Bilirubin: 0.4 mg/dL (ref 0.2–1.2)

## 2015-01-20 NOTE — Assessment & Plan Note (Signed)
Kevin Boyd MaduroRobert has an unfortunate diagnosis of idiopathic pulmonary fibrosis. Fortunately his 6 minute walk numbers today are actually quite a bit higher than his most recent test. His lung function testing is essentially stable. There is some minor differences in his DLCO but I consider that within range of air for testing. He has tolerated Esbriet well.  However, this disease will progress regardless the medication we prescribed. At some point he will need to be considered for a lung transplantation. He is not making himself a better candidate by not exercising and not losing weight. This was reiterated with him again at length today.  Plan: Repeat 6 minute walk in lung function test in 6 months Liver function testing today to check for drug related toxicity Repeat liver function testing in 3 months Diet and exercise Continue Esbriet at current dosing Follow-up 6 months or sooner if needed  > 25 minutes spent on today's visit

## 2015-01-20 NOTE — Progress Notes (Signed)
PFT done today. 

## 2015-01-20 NOTE — Patient Instructions (Signed)
Keep taking Esbriet as you're doing Exercise regularly lose weight We will get blood work today and again in 3 months When you come back 6 months from now we will arrange a lung function tests and a 6 minute walk We will see you back in 6 months

## 2015-01-20 NOTE — Progress Notes (Signed)
Subjective:    Patient ID: [facility], male    DOB: 10-27-1965, 49 y.o.   MRN: [id number]  Synopsis: First evaluated by [facility] in 2015 for usual interstitial pneumonitis found on a biopsy. He has a family history significant for pulmonary fibrosis in that his mother died and he has a brother with the disease. Serology panel in 2015 was negative for connective tissue disease.  Diagnosed with IPF.  09/2002 [place] CXR showed infiltrates transiently that cleared on a follow up film one month later 01/2014 ANA, RF, SCL-70, SSA/SSB, Aldolase, Anti-Jo-1, centromere all neg August 2015 CT chest> There is groundglass opacification, scattered intralobular septal thickening, traction bronchiectasis and peripheral based honeycombing. The majority of the interstitial changes are peripheral based. There does not appear to be a craniocaudal gradient 12/28/2013 open lung biopsy: pulmonary fibrosis, severe, 1 sections show honeycombing, fibroblastic foci and overall variegated appearance with subpleural accentuation fibrosis, most consistent with usual interstitial pneumonitis. Granulomas are not seen. 03/22/2014 PFT> Ratio 82%, FEV1 2.91L (74% pred, 8% pred), TLCO 4.67L (62% pred), DLCO 35.4 (45% pred) 04/2014 1660 feet, O2 98% RA July 2016 pulmonary function testing> ratio 72%, FEV1 2.44 L (54% Pred), forced vital capacity 3.39 L (58% predicted), total lung capacity 5.48 L (70% predicted), DLCO 22.72 (60% predicted) . July 2016 6 minute walk> 372 m) 1220 feet) O2 sat 96% October 2016 pulmonary function testing ratio 88%, FEV1 3.47 L (77% predicted), total lung capacity 5.38 L (69% productive) sees, DLCO 20.55 (54% predicted) October 2016 6 minute walk test 479 m, O2 saturation 97%.   HPI Chief Complaint  Patient presents with  . Follow-up    review 6mw and pft.  pt c/o DOE worse with inclines, chest heaviness, cough worse in a.m.  Pt has been experiencing insomnia recently, esbriet rep  states this is a side effect.  pt had a stroke approx 3 weeks ago.     [facility] had a stroke a few weeks ago and ended up at [facility].  He had symptoms of severe stuttering, left sided weakness.  This assessment was negative [facility].  Then he ended up having an evaluation at [facility].  It sounds like this assessment was something related to complicated migraines.  He says that he was given ?haldol? And his symptoms improved.  He has returned to work and his symptoms are now better.  He says that the final assessment was that he had a stroke, but apparently the MRI didn't really show this.  He says that his breathing "sucks".  Any little incline or steps will bother him.  He feels like there is something on his chest pressing down constantly.    He says that his chest scar has been hurting more recently and he has a lot of pain when he coughs.   Past Medical History  Diagnosis Date  . Sleep apnea   . Interstitial lung disease (HCC)   . Bipolar 1 disorder (HCC)   . GERD (gastroesophageal reflux disease)   . Seasonal allergies   . Scoliosis   . COPD (chronic obstructive pulmonary disease) (HCC)   . Asthma   . C. difficile diarrhea   . Borderline diabetes mellitus       Review of Systems  Constitutional: Negative for fever and chills.  HENT: Negative for postnasal drip, rhinorrhea and sinus pressure.   Respiratory: Positive for shortness of breath. Negative for wheezing.   Cardiovascular: Negative for chest pain, palpitations and leg swelling.  Gastrointestinal: Negative for  diarrhea.       Objective:   Physical Exam Filed Vitals:   01/20/15 1451  BP: 136/74  Pulse: 76  Height: 6\' 2"  (1.88 m)  Weight: 285 lb (129.275 kg)  SpO2: 97%  RA    Gen: well appearing, no acute distress HEENT: NCAT, EOMi, OP clear,  PULM: Crackles bilaterally in bases, no wheezing CV: RRR, no mgr, no JVD AB: BS+, soft, nontender,  Ext: warm, no edema, no clubbing, no cyanosis Derm: no rash or skin  breakdown Neuro: A&Ox4, MAEW  [facility] from [facility] at [facility] were reviewed where he was treated for left-sided weakness. Final diagnosis was a headache (migraine type) Today's pulmonary function testing in 6 minute walk were reviewed, see above     Assessment & Plan:   IPF (idiopathic pulmonary fibrosis) (HCC) Kevin MaduroRobert has an unfortunate diagnosis of idiopathic pulmonary fibrosis. Fortunately his 6 minute walk numbers today are actually quite a bit higher than his most recent test. His lung function testing is essentially stable. There is some minor differences in his DLCO but I consider that within range of air for testing. He has tolerated Esbriet well.  However, this disease will progress regardless the medication we prescribed. At some point he will need to be considered for a lung transplantation. He is not making himself a better candidate by not exercising and not losing weight. This was reiterated with him again at length today.  Plan: Repeat 6 minute walk in lung function test in 6 months Liver function testing today to check for drug related toxicity Repeat liver function testing in 3 months Diet and exercise Continue Esbriet at current dosing Follow-up 6 months or sooner if needed  > 25 minutes spent on today's visit    Updated Medication List Outpatient Encounter Prescriptions as of 01/20/2015  Medication Sig  . albuterol (PROVENTIL HFA;VENTOLIN HFA) 108 (90 BASE) MCG/ACT inhaler Inhale 2 puffs into the lungs every 6 (six) hours as needed for wheezing or shortness of breath.  Marland Kitchen. aspirin EC 81 MG tablet Take 1 tablet (81 mg total) by mouth daily.  Marland Kitchen. atorvastatin (LIPITOR) 40 MG tablet Take 1 tablet (40 mg total) by mouth daily at 6 PM.  . Bioflavonoid Products (ESTER C PO) Take 1 tablet by mouth daily.  . carbamazepine (TEGRETOL XR) 100 MG 12 hr tablet Take 100-200 mg by mouth 2 (two) times daily. 1 tab qam, 2 tabs qhs  . cetirizine (ZYRTEC) 10 MG tablet Take 10 mg  by mouth daily.  . Cholecalciferol (VITAMIN D-3) 5000 UNITS TABS Take 1 tablet by mouth daily.  Marland Kitchen. docusate sodium (COLACE) 100 MG capsule Take 100 mg by mouth at bedtime.  Marland Kitchen. esomeprazole (NEXIUM) 40 MG capsule Take 1 capsule (40 mg total) by mouth 2 (two) times daily before a meal.  . ferrous gluconate (FERGON) 324 MG tablet Take 324 mg by mouth every other day. At night  . fluticasone (FLONASE) 50 MCG/ACT nasal spray Place 2 sprays into both nostrils daily.  Marland Kitchen. lamoTRIgine (LAMICTAL) 200 MG tablet Take 200 mg by mouth 2 (two) times daily.  . Pirfenidone 267 MG CAPS Take 2 tablets by mouth 3 (three) times daily.   . Probiotic Product (PROBIOTIC DAILY PO) Take 1 tablet by mouth daily.   No facility-administered encounter medications on file as of 01/20/2015.

## 2015-01-20 NOTE — Addendum Note (Signed)
Addended by: Karalee HeightOX, Candance Bohlman P on: 01/20/2015 03:38 PM   Modules accepted: Orders

## 2015-03-17 ENCOUNTER — Encounter: Payer: Self-pay | Admitting: Pulmonary Disease

## 2015-03-17 ENCOUNTER — Ambulatory Visit (INDEPENDENT_AMBULATORY_CARE_PROVIDER_SITE_OTHER)
Admission: RE | Admit: 2015-03-17 | Discharge: 2015-03-17 | Disposition: A | Discharge status: Home / Self Care | Payer: BLUE CROSS/BLUE SHIELD | Source: Ambulatory Visit | Attending: Pulmonary Disease | Admitting: Pulmonary Disease

## 2015-03-17 ENCOUNTER — Ambulatory Visit (INDEPENDENT_AMBULATORY_CARE_PROVIDER_SITE_OTHER): Payer: BLUE CROSS/BLUE SHIELD | Admitting: Pulmonary Disease

## 2015-03-17 VITALS — BP 130/76 | HR 79 | Temp 98.0°F | Ht 74.0 in | Wt 281.0 lb

## 2015-03-17 DIAGNOSIS — J189 Pneumonia, unspecified organism: Secondary | ICD-10-CM

## 2015-03-17 MED ORDER — FLUTTER DEVI: Status: AC

## 2015-03-17 MED ORDER — LEVOFLOXACIN 500 MG PO TABS: 500.0000 mg | ORAL_TABLET | Freq: Every day | ORAL | Status: DC

## 2015-03-17 MED ORDER — PREDNISONE 20 MG PO TABS: 20.0000 mg | ORAL_TABLET | Freq: Every day | ORAL | Status: DC

## 2015-03-17 NOTE — Addendum Note (Signed)
Addended by: Velvet BatheAULFIELD, Caasi Giglia L on: 03/17/2015 02:24 PM   Modules accepted: Orders

## 2015-03-17 NOTE — Addendum Note (Signed)
Addended by: Boone MasterJONES, Elianna Windom E on: 03/17/2015 04:08 PM   Modules accepted: Orders

## 2015-03-17 NOTE — Progress Notes (Signed)
Subjective:    Patient ID: [facility], male    DOB: 07-Apr-1965, 49 y.o.   MRN: [id number]  Synopsis: First evaluated by [facility] in 2015 for usual interstitial pneumonitis found on a biopsy. He has a family history significant for pulmonary fibrosis in that his mother died and he has a brother with the disease. Serology panel in 2015 was negative for connective tissue disease.  Diagnosed with IPF.  09/2002 [place] CXR showed infiltrates transiently that cleared on a follow up film one month later 01/2014 ANA, RF, SCL-70, SSA/SSB, Aldolase, Anti-Jo-1, centromere all neg August 2015 CT chest> There is groundglass opacification, scattered intralobular septal thickening, traction bronchiectasis and peripheral based honeycombing. The majority of the interstitial changes are peripheral based. There does not appear to be a craniocaudal gradient 12/28/2013 open lung biopsy: pulmonary fibrosis, severe, 1 sections show honeycombing, fibroblastic foci and overall variegated appearance with subpleural accentuation fibrosis, most consistent with usual interstitial pneumonitis. Granulomas are not seen. 03/22/2014 PFT> Ratio 82%, FEV1 2.91L (74% pred, 8% pred), TLCO 4.67L (62% pred), DLCO 35.4 (45% pred) 04/2014 1660 feet, O2 98% RA July 2016 pulmonary function testing> ratio 72%, FEV1 2.44 L (54% Pred), forced vital capacity 3.39 L (58% predicted), total lung capacity 5.48 L (70% predicted), DLCO 22.72 (60% predicted) . July 2016 6 minute walk> 372 m) 1220 feet) O2 sat 96% October 2016 pulmonary function testing ratio 88%, FEV1 3.47 L (77% predicted), total lung capacity 5.38 L (69% productive) sees, DLCO 20.55 (54% predicted) October 2016 6 minute walk test 479 m, O2 saturation 97%.   HPI Chief Complaint  Patient presents with  . Acute Visit    Pt c/o nonprod cough, chest congestion since Thanksgiving.  [facility] was seen at [facility], was dx'ed with pna by cxr.     [facility] said that he "got  sick" after thanksgiving and started having fever, chills, and cough which was really worse at night.  He has been through mucinex and tessalon perles.  He was eventually seen last week and was prescribed Levaquin and is still taking that right now.  He still has significant chest congestion and feel like there is something in his chest holding him back.  He was told to come see [place].   Since taking the Levaquin the fever has gone away but he still has chills.  He is taking ibuprofen.  He has been taking hydrocodone cough syrup.  That will help him quit coughing.   He is frustrated by the duration of his symptoms. He is very fatigued.  He doesn't feel like he is getting any better.  This morning he could feel mucus rattling around in his chest.  He is taking mucinex DM every 12 hours. He has been dizzy a lot with this illness.    Past Medical History  Diagnosis Date  . Sleep apnea   . Interstitial lung disease (HCC)   . Bipolar 1 disorder (HCC)   . GERD (gastroesophageal reflux disease)   . Seasonal allergies   . Scoliosis   . COPD (chronic obstructive pulmonary disease) (HCC)   . Asthma   . C. difficile diarrhea   . Borderline diabetes mellitus       Review of Systems  Constitutional: Positive for fever, chills and fatigue.  HENT: Negative for postnasal drip, rhinorrhea and sinus pressure.   Respiratory: Positive for cough and shortness of breath. Negative for wheezing.   Cardiovascular: Negative for chest pain, palpitations and leg swelling.  Gastrointestinal:  Negative for diarrhea.       Objective:   Physical Exam Filed Vitals:   03/17/15 1346  BP: 130/76  Pulse: 79  Temp: 98 F (36.7 C)  TempSrc: Oral  Height:  (1.88 m)  Weight: 281 lb (127.461 kg)  SpO2: 95%  RA    Gen: well appearing, no acute distress HEENT: NCAT, EOMi, OP clear,  PULM: Crackles bilaterally in bases, Higher in R lung than usual, normal effort, no wheezing CV: RRR, no mgr, no JVD AB: BS+,  soft, nontender,  Ext: warm, no edema, no clubbing, no cyanosis Derm: no rash or skin breakdown Neuro: A&Ox4, MAEW  Records from [facility] outpatient clinic were reviewed where he was treated for pneumonia CBC    Component Value Date/Time   WBC 6.0 11/04/2014 0509   WBC 10.6 12/29/2013 0411   RBC 4.27* 11/04/2014 0509   RBC 4.37* 12/29/2013 0411   HGB 13.2 11/04/2014 0509   HGB 13.5 12/29/2013 0411   HCT 38.6* 11/04/2014 0509   HCT 40.8 12/29/2013 0411   PLT 177 11/04/2014 0509   PLT 199 12/29/2013 0411   MCV 90.5 11/04/2014 0509   MCV 93 12/29/2013 0411   MCH 31.0 11/04/2014 0509   MCH 30.9 12/29/2013 0411   MCHC 34.2 11/04/2014 0509   MCHC 33.1 12/29/2013 0411   RDW 13.2 11/04/2014 0509   RDW 13.3 12/29/2013 0411   LYMPHSABS 1.8 08/20/2014 0813   LYMPHSABS 1.6 12/29/2013 0411   MONOABS 0.4 08/20/2014 0813   MONOABS 0.9 12/29/2013 0411   EOSABS 0.1 08/20/2014 0813   EOSABS 0.0 12/29/2013 0411   BASOSABS 0.0 08/20/2014 0813   BASOSABS 0.0 12/29/2013 0411         Assessment & Plan:   CAP (community acquired pneumonia) Unfortunately it sounds as if he has community acquired pneumonia based on his symptoms. This is certainly complicated significantly by his idiopathic pulmonary fibrosis. I explained to him that pneumonia for people who do not have baseline lung disease is a miserable experiencing causes prolonged symptoms for several weeks. In his particular case he is going to have an even longer rehabilitation course. At this time I'm still concerned that he is infected as he still having chills and producing copious amounts of sputum.  Plan: Chest x-ray today to compare to our previous studies Extend course of Levaquin for 5 more days Prednisone 5 days 20 mg daily Instructed on appropriate guaifenesin to buy from the pharmacy Flutter valve given with instructions Follow-up as previously scheduled    Updated Medication List Outpatient Encounter  Prescriptions as of 03/17/2015  Medication Sig  . albuterol (PROVENTIL HFA;VENTOLIN HFA) 108 (90 BASE) MCG/ACT inhaler Inhale 2 puffs into the lungs every 6 (six) hours as needed for wheezing or shortness of breath.  Marland Kitchen aspirin EC 81 MG tablet Take 1 tablet (81 mg total) by mouth daily.  Marland Kitchen atorvastatin (LIPITOR) 40 MG tablet Take 1 tablet (40 mg total) by mouth daily at 6 PM.  . benzonatate (TESSALON) 100 MG capsule Take 200 mg by mouth 3 (three) times daily as needed for cough.  Marland Kitchen Bioflavonoid Products (ESTER C PO) Take 1 tablet by mouth daily.  . carbamazepine (TEGRETOL XR) 100 MG 12 hr tablet Take 100-200 mg by mouth 2 (two) times daily. 1 tab qam, 2 tabs qhs  . cetirizine (ZYRTEC) 10 MG tablet Take 10 mg by mouth daily.  . Cholecalciferol (VITAMIN D-3) 5000 UNITS TABS Take 1 tablet by mouth daily.  Marland Kitchen  docusate sodium (COLACE) 100 MG capsule Take 100 mg by mouth at bedtime.  Marland Kitchen. esomeprazole (NEXIUM) 40 MG capsule Take 1 capsule (40 mg total) by mouth 2 (two) times daily before a meal.  . ferrous gluconate (FERGON) 324 MG tablet Take 324 mg by mouth every other day. At night  . fluticasone (FLONASE) 50 MCG/ACT nasal spray Place 2 sprays into both nostrils daily.  Marland Kitchen. lamoTRIgine (LAMICTAL) 200 MG tablet Take 200 mg by mouth 2 (two) times daily.  Marland Kitchen. levofloxacin (LEVAQUIN) 500 MG tablet Take 500 mg by mouth daily.  . Pirfenidone 267 MG CAPS Take 2 tablets by mouth 3 (three) times daily.   . Probiotic Product (PROBIOTIC DAILY PO) Take 1 tablet by mouth daily.   No facility-administered encounter medications on file as of 03/17/2015.

## 2015-03-17 NOTE — Assessment & Plan Note (Signed)
Unfortunately it sounds as if he has community acquired pneumonia based on his symptoms. This is certainly complicated significantly by his idiopathic pulmonary fibrosis. I explained to him that pneumonia for people who do not have baseline lung disease is a miserable experiencing causes prolonged symptoms for several weeks. In his particular case he is going to have an even longer rehabilitation course. At this time I'm still concerned that he is infected as he still having chills and producing copious amounts of sputum.  Plan: Chest x-ray today to compare to our previous studies Extend course of Levaquin for 5 more days Prednisone 5 days 20 mg daily Instructed on appropriate guaifenesin to buy from the pharmacy Flutter valve given with instructions Follow-up as previously scheduled

## 2015-03-17 NOTE — Patient Instructions (Signed)
Take the Levaquin as prescribed for 5 more days Take the prednisone for 5 days Use the flutter valve 4-5 breaths, 4-5 times a day to help clear the mucus I want you to take plain Mucinex (guaifenesin) 1200 mg twice a day We will call you with the results of the chest x-ray We will see you back as previously scheduled

## 2015-03-17 NOTE — Addendum Note (Signed)
Addended by: Velvet BatheAULFIELD, ASHLEY L on: 03/17/2015 02:23 PM   Modules accepted: Orders

## 2015-03-22 ENCOUNTER — Telehealth: Payer: Self-pay | Admitting: Pulmonary Disease

## 2015-03-22 MED ORDER — PREDNISONE 10 MG PO TABS: ORAL_TABLET | ORAL | Status: DC

## 2015-03-22 NOTE — Telephone Encounter (Signed)
CXR w/ chronic changes , no sign of PNA  Would cont on mucinex .  No further abx.  Cont w/ flutter .  Can use prednisone taper over next week to see if this quiets down the inflammation/cough.  Prednisone #20 , 4 tabs for 2 days, then 3 tabs for 2 days, 2 tabs for 2 days, then 1 tab for 2 days, then stop  Ov if not better  Please contact office for sooner follow up if symptoms do not improve or worsen or seek emergency care

## 2015-03-22 NOTE — Telephone Encounter (Signed)
Spoke with pt, c/o nonprod cough-taking mucinex qid, using flutter valve 4-5X daily but still unable to get up anything.  Also notes continual SOB.  Pt taking last dose of extended levaquin today.   Pt also requesting cxr results of 12/15 cxr.  Also requesting something to help with s/s.    Pt uses [facility] [place] [address].    Sending to TP as BQ is unavailable today.  Please advise.  Thanks!

## 2015-03-22 NOTE — Telephone Encounter (Signed)
Called spoke with pt. Aware of results. He verbalized understanding and had no questions. RX for prednisone sent in. Nothing further needed

## 2015-03-29 ENCOUNTER — Telehealth: Payer: Self-pay | Admitting: Pulmonary Disease

## 2015-03-29 NOTE — Telephone Encounter (Signed)
Per CXR result note: Result Note     A,    Please let the patient know this was OK, no major changes in his CXR findings   ---  I spoke with patient about results and he verbalized understanding and had no questions

## 2015-04-12 ENCOUNTER — Telehealth: Payer: Self-pay | Admitting: Pulmonary Disease

## 2015-04-12 NOTE — Telephone Encounter (Signed)
Whatever dose of prednisone he is taking should double until better then back to previous  D/c zyrtec and For drainage / throat tickle try take CHLORPHENIRAMINE  4 mg - take one every 4 hours as needed - available over the counter- may cause drowsiness so start with just a bedtime dose or two and see how you tolerate it before trying in daytime

## 2015-04-12 NOTE — Telephone Encounter (Signed)
TP has left already.  Sending to MW.  Please advise on recs.  Thanks!

## 2015-04-12 NOTE — Telephone Encounter (Signed)
Spoke with pt, states his cough is progressively worsening since Thanksgiving.  Cough is nonproductive but feels like he has congestion in his chest.  Notes lots of PND, sinus congestion.  Denies fever, chest pain, tightness.   Pt using flutter valve, tessalon perles, hycodan cough syrup, zyrtec, otc sinus decongestant.   Pt requesting further recs.   [facility] uses [facility] on  [address]    BQ please advise on recs.  Thanks.

## 2015-04-12 NOTE — Telephone Encounter (Signed)
Left message for patient to call back  

## 2015-04-12 NOTE — Telephone Encounter (Signed)
Sending to TP as this has not been answered.  Please advise.  Thanks!

## 2015-04-13 NOTE — Telephone Encounter (Signed)
lmtcb

## 2015-04-13 NOTE — Telephone Encounter (Signed)
Thanks Mike!

## 2015-04-13 NOTE — Telephone Encounter (Signed)
Called and spoke with patient. Informed him of MW's recs. Pt voiced understanding and had no further questions. Nothing further needed.

## 2015-04-25 ENCOUNTER — Telehealth: Payer: Self-pay | Admitting: Pulmonary Disease

## 2015-04-25 ENCOUNTER — Ambulatory Visit (INDEPENDENT_AMBULATORY_CARE_PROVIDER_SITE_OTHER): Payer: BLUE CROSS/BLUE SHIELD | Admitting: Internal Medicine

## 2015-04-25 ENCOUNTER — Encounter: Payer: Self-pay | Admitting: Internal Medicine

## 2015-04-25 ENCOUNTER — Ambulatory Visit (INDEPENDENT_AMBULATORY_CARE_PROVIDER_SITE_OTHER)
Admission: RE | Admit: 2015-04-25 | Discharge: 2015-04-25 | Disposition: A | Discharge status: Home / Self Care | Payer: BLUE CROSS/BLUE SHIELD | Source: Ambulatory Visit | Attending: Internal Medicine | Admitting: Internal Medicine

## 2015-04-25 VITALS — BP 124/82 | HR 99 | Ht 74.0 in | Wt 280.2 lb

## 2015-04-25 DIAGNOSIS — R059 Cough, unspecified: Secondary | ICD-10-CM

## 2015-04-25 DIAGNOSIS — R05 Cough: Secondary | ICD-10-CM | POA: Diagnosis not present

## 2015-04-25 MED ORDER — PREDNISONE 10 MG PO TABS: ORAL_TABLET | ORAL | Status: DC

## 2015-04-25 MED ORDER — TRAMADOL HCL 50 MG PO TABS: ORAL_TABLET | ORAL | Status: DC

## 2015-04-25 NOTE — Patient Instructions (Addendum)
Change nexium:   Take 30- 60 min before your first and last meals of the day and pepcid ac 20 mg until quit coughing   GERD (REFLUX)  is an extremely common cause of respiratory symptoms just like yours , many times with no obvious heartburn at all.    It can be treated with medication, but also with lifestyle changes including elevation of the head of your bed (ideally with 6 inch  bed blocks),  Smoking cessation, avoidance of late meals, excessive alcohol, and avoid fatty foods, chocolate, peppermint, colas, red wine, and acidic juices such as orange juice.  NO MINT OR MENTHOL PRODUCTS SO NO COUGH DROPS  USE SUGARLESS CANDY INSTEAD (Jolley ranchers or Stover's or Life Savers) or even ice chips will also do - the key is to swallow to prevent all throat clearing. NO OIL BASED VITAMINS - use powdered substitutes.  Please remember to go to the x-ray department downstairs for your tests - we will call you with the results when they are available.  Take delsym two tsp every 12 hours and supplement if needed with  tramadol 50 mg up to 2 every 4 hours to suppress the urge to cough. Swallowing water or using ice chips/non mint and menthol containing candies (such as lifesavers or sugarless jolly ranchers) are also effective.  You should rest your voice and avoid activities that you know make you cough.  Once you have eliminated the cough for 3 straight days try reducing the tramadol first,  then the delsym as tolerated.  Keep appt with Dr Kendrick Fries, if not better return asap with all meds in hand

## 2015-04-25 NOTE — Telephone Encounter (Signed)
Spoke with pt. States that he is not feeling any better since he called [place] on 04/12/15. Reports coughing and SOB. Cough is non productive and is now causing rib pain. Onset of all symptoms was before Thanksgiving. Has been taking Mucinex and a round of prednisone with no relief. Denies chest tightness or wheezing. Would like recommendations.  VS - please advise in BQ's absence. Thanks.

## 2015-04-25 NOTE — Telephone Encounter (Signed)
Spoke with pt. He is aware of VS recommendation. OV has been scheduled for 04/25/15 at 1:45pm. Order will be placed for CXR. Nothing further was needed.

## 2015-04-25 NOTE — Telephone Encounter (Signed)
He needs to come in for visit with chest xray.

## 2015-04-25 NOTE — Progress Notes (Signed)
Subjective:    Patient ID: Kevin Boyd, male    DOB: 1966/01/18, 50 y.o.   MRN: [id number]  Synopsis: First evaluated by [facility] in 2015 for usual interstitial pneumonitis found on a biopsy. He has a family history significant for pulmonary fibrosis in that his mother died and he has a brother with the disease. Serology panel in 2015 was negative for connective tissue disease.  Diagnosed with IPF.  09/2002 [place] CXR showed infiltrates transiently that cleared on a follow up film one month later 01/2014 ANA, RF, SCL-70, SSA/SSB, Aldolase, Anti-Jo-1, centromere all neg August 2015 CT chest> There is groundglass opacification, scattered intralobular septal thickening, traction bronchiectasis and peripheral based honeycombing. The majority of the interstitial changes are peripheral based. There does not appear to be a craniocaudal gradient 12/28/2013 open lung biopsy: pulmonary fibrosis, severe, 1 sections show honeycombing, fibroblastic foci and overall variegated appearance with subpleural accentuation fibrosis, most consistent with usual interstitial pneumonitis. Granulomas are not seen. 03/22/2014 PFT> Ratio 82%, FEV1 2.91L (74% pred, 8% pred), TLCO 4.67L (62% pred), DLCO 35.4 (45% pred) 04/2014 1660 feet, O2 98% RA July 2016 pulmonary function testing> ratio 72%, FEV1 2.44 L (54% Pred), forced vital capacity 3.39 L (58% predicted), total lung capacity 5.48 L (70% predicted), DLCO 22.72 (60% predicted) . July 2016 6 minute walk> 372 m) 1220 feet) O2 sat 96% October 2016 pulmonary function testing ratio 88%, FEV1 3.47 L (77% predicted), total lung capacity 5.38 L (69% productive) sees, DLCO 20.55 (54% predicted) October 2016 6 minute walk test 479 m, O2 saturation 97%.   HPI Chief Complaint  Patient presents with  . Acute Visit    Pt c/o nonprod cough, chest congestion since Thanksgiving.  pt was seen at [facility], was dx'ed with pna by cxr.    Kevin Boyd said that he "got sick"  after thanksgiving and started having fever, chills, and cough which was really worse at night.  He has been through mucinex and tessalon perles.  He was eventually seen last week and was prescribed Levaquin and is still taking that right now.  He still has significant chest congestion and feel like there is something in his chest holding him back.  He was told to come see [place].   Since taking the Levaquin the fever has gone away but he still has chills.  He is taking ibuprofen.  He has been taking hydrocodone cough syrup.  That will help him quit coughing.   He is frustrated by the duration of his symptoms. He is very fatigued.  He doesn't feel like he is getting any better.  This morning he could feel mucus rattling around in his chest.  He is taking mucinex DM every 12 hours. He has been dizzy a lot with this illness.   rec Take the Levaquin as prescribed for 5 more days Take the prednisone for 5 days Use the flutter valve 4-5 breaths, 4-5 times a day to help clear the mucus I want you to take plain Mucinex (guaifenesin) 1200 mg twice a day   04/25/2015 acute extended ov/Kevin Boyd re: cough x early november 2016  Chief Complaint  Patient presents with  . Acute Visit    c/o cough since thanksgiving - non prod - cough causes sob and dizziness - Has tried Mucinex, Tessalon, Chlor trimeton and hasnt helped - c/o rib pain and burning in center chest - Using albuterol 3-4 times a day  dry  Cough indolent onset/ progressively worse since onset in  Nov 2016 >>  24/7/ taking menthol/ d3 oil tablets/ now gen ant cp with coughing fits "ripping sensation with severe cough"  No obvious day to day or daytime variability or assoc  chest tightness, subjective wheeze or overt sinus or hb symptoms. No unusual exp hx or h/o childhood pna/ asthma or knowledge of premature birth.  Sleeping ok without nocturnal  or early am exacerbation  of respiratory  c/o's or need for noct saba. Also denies any obvious fluctuation of  symptoms with weather or environmental changes or other aggravating or alleviating factors except as outlined above   Current Medications, Allergies, Complete Past Medical History, Past Surgical History, Family History, and Social History were reviewed in [employer] record.  ROS  The following are not active complaints unless bolded sore throat, dysphagia, dental problems, itching, sneezing,  nasal congestion or excess/ purulent secretions, ear ache,   fever, chills, sweats, unintended wt loss, classically pleuritic or exertional cp, hemoptysis,  orthopnea pnd or leg swelling, presyncope, palpitations, abdominal pain, anorexia, nausea, vomiting, diarrhea  or change in bowel or bladder habits, change in stools or urine, dysuria,hematuria,  rash, arthralgias, visual complaints, headache, numbness, weakness or ataxia or problems with walking or coordination,  change in mood/affect or memory.             Objective:   Physical Exam    Wt Readings from Last 3 Encounters:  04/25/15 280 lb 3.2 oz (127.098 kg)  03/17/15 281 lb (127.461 kg)  01/20/15 285 lb (129.275 kg)    Vital signs reviewed    Gen: amb wm nad seems distraught about his cough  HEENT: NCAT, EOMi, OP clear,  PULM: Very minimal insp crackles bilaterally in bases, with coughing fits with deep breathing occurring on insp  CV: RRR, no mgr, no JVD AB: BS+, soft, nontender,  Ext: warm, no edema, no clubbing, no cyanosis Derm: no rash or skin breakdown Neuro: A&Ox4, MAEW   CXR PA and Lateral:   04/25/2015 :    I personally reviewed images and agree with radiology impression as follows:    Chronic interstitial lung disease with a peripheral and basilar predominance.  No definite superimposed acute abnormalities.         Assessment & Plan:

## 2015-04-26 NOTE — Progress Notes (Signed)
Quick Note:  LMTCB ______ 

## 2015-04-28 ENCOUNTER — Telehealth: Payer: Self-pay | Admitting: Internal Medicine

## 2015-04-28 NOTE — Telephone Encounter (Signed)
Call pt: Reviewed cxr and no acute change so no change in recommendations made at ov  Spoke with pt and notified of results per Dr. Wert. Pt verbalized understanding and denied any questions.  

## 2015-04-28 NOTE — Progress Notes (Signed)
Quick Note:  LMTCB ______ 

## 2015-05-01 ENCOUNTER — Encounter: Payer: Self-pay | Admitting: Internal Medicine

## 2015-05-01 NOTE — Assessment & Plan Note (Signed)
Cough has features typical of uip but way out of proportion to what one usually sees suggesting a second mechanism, most likely cyclical/uacs =  Upper airway cough syndrome, so named because it's frequently impossible to sort out how much is  CR/sinusitis with freq throat clearing (which can be related to primary GERD)   vs  causing  secondary (" extra esophageal")  GERD from wide swings in gastric pressure that occur with throat clearing, often  promoting self use of mint and menthol lozenges that reduce the lower esophageal sphincter tone and exacerbate the problem further in a cyclical fashion.   These are the same pts (now being labeled as having "irritable larynx syndrome" by some cough centers) who not infrequently have a history of having failed to tolerate ace inhibitors,  dry powder inhalers or biphosphonates or report having atypical reflux symptoms that don't respond to standard doses of PPI , and are easily confused as having aecopd or asthma flares by even experienced allergists/ pulmonologists.   For now rec max gerd rx and eliminate cyclical coughing with tramadol and regroup with Dr Kendrick Fries in 2 weeks   I had an extended discussion with the patient reviewing all relevant studies completed to date and  lasting 15 to 20 minutes of a 25 minute visit    Each maintenance medication was reviewed in detail including most importantly the difference between maintenance and prns and under what circumstances the prns are to be triggered using an action plan format that is not reflected in the computer generated alphabetically organized AVS.    Please see instructions for details which were reviewed in writing and the patient given a copy highlighting the part that I personally wrote and discussed at today's ov.

## 2015-05-16 ENCOUNTER — Other Ambulatory Visit: Payer: Self-pay

## 2015-05-16 ENCOUNTER — Telehealth: Payer: Self-pay | Admitting: Pulmonary Disease

## 2015-05-16 MED ORDER — PIRFENIDONE 267 MG PO CAPS: 2.0000 | ORAL_CAPSULE | Freq: Three times a day (TID) | ORAL | Status: DC

## 2015-05-16 NOTE — Telephone Encounter (Signed)
Called [facility] and was on hold x 15 min and no rep came to the phone. WCB

## 2015-05-17 NOTE — Telephone Encounter (Signed)
Called spoke with patient and informed him that per our records he is to be taking his Esbriet 2capsules by mouth TID.  Pt apologized and reported that he has been taking only 2 capsules BID and reported that he does have an excess at home and realizes this is why.   Advised pt will need to check with BQ to make sure it is okay for him to go ahead and increase to 2 capsules TID or if this needs to be more gradual.  Dr Kendrick Fries please advise, thank you.

## 2015-05-17 NOTE — Telephone Encounter (Signed)
Pt returning caLL.Kevin Boyd °

## 2015-05-17 NOTE — Telephone Encounter (Signed)
2 TID now

## 2015-05-17 NOTE — Telephone Encounter (Signed)
Called [facility], spoke with [facility] Needing clarification on Esbriet dosing.  Per our records he is supposed to be taking 2 Capsules TID [facility] states that she is going to leave the Rx as is and needs [place] to contact that patient to discuss this with him as he is under the impression he is supposed to be taking it BID.   Called patient, LM x 1

## 2015-05-18 NOTE — Telephone Encounter (Signed)
Spoke with pt. He is aware to take 2 capsules TID. Nothing further was needed.

## 2015-05-25 ENCOUNTER — Ambulatory Visit
Admission: RE | Admit: 2015-05-25 | Discharge: 2015-05-25 | Disposition: A | Discharge status: Home / Self Care | Payer: BLUE CROSS/BLUE SHIELD | Source: Ambulatory Visit | Attending: Otolaryngology | Admitting: Otolaryngology

## 2015-05-25 ENCOUNTER — Other Ambulatory Visit: Payer: Self-pay | Admitting: Otolaryngology

## 2015-05-25 ENCOUNTER — Other Ambulatory Visit: Payer: Self-pay | Admitting: *Deleted

## 2015-05-25 DIAGNOSIS — R053 Chronic cough: Secondary | ICD-10-CM

## 2015-05-25 DIAGNOSIS — R05 Cough: Secondary | ICD-10-CM

## 2015-05-25 DIAGNOSIS — R0982 Postnasal drip: Secondary | ICD-10-CM | POA: Diagnosis present

## 2015-06-19 ENCOUNTER — Emergency Department: Payer: BLUE CROSS/BLUE SHIELD

## 2015-06-19 ENCOUNTER — Emergency Department
Admission: EM | Admit: 2015-06-19 | Discharge: 2015-06-19 | Disposition: A | Discharge status: Home / Self Care | Payer: BLUE CROSS/BLUE SHIELD | Attending: Emergency Medicine | Admitting: Emergency Medicine

## 2015-06-19 ENCOUNTER — Encounter: Payer: Self-pay | Admitting: Emergency Medicine

## 2015-06-19 DIAGNOSIS — E785 Hyperlipidemia, unspecified: Secondary | ICD-10-CM | POA: Insufficient documentation

## 2015-06-19 DIAGNOSIS — F3189 Other bipolar disorder: Secondary | ICD-10-CM | POA: Diagnosis not present

## 2015-06-19 DIAGNOSIS — M419 Scoliosis, unspecified: Secondary | ICD-10-CM | POA: Diagnosis not present

## 2015-06-19 DIAGNOSIS — J8410 Pulmonary fibrosis, unspecified: Secondary | ICD-10-CM | POA: Diagnosis not present

## 2015-06-19 DIAGNOSIS — G43909 Migraine, unspecified, not intractable, without status migrainosus: Secondary | ICD-10-CM | POA: Diagnosis not present

## 2015-06-19 DIAGNOSIS — J449 Chronic obstructive pulmonary disease, unspecified: Secondary | ICD-10-CM | POA: Diagnosis not present

## 2015-06-19 DIAGNOSIS — R519 Headache, unspecified: Secondary | ICD-10-CM

## 2015-06-19 DIAGNOSIS — R51 Headache: Secondary | ICD-10-CM

## 2015-06-19 DIAGNOSIS — Z87891 Personal history of nicotine dependence: Secondary | ICD-10-CM | POA: Insufficient documentation

## 2015-06-19 LAB — CBC
HEMATOCRIT: 39.6 % — AB (ref 40.0–52.0)
Hemoglobin: 13.7 g/dL (ref 13.0–18.0)
MCH: 30.8 pg (ref 26.0–34.0)
MCHC: 34.7 g/dL (ref 32.0–36.0)
MCV: 88.9 fL (ref 80.0–100.0)
Platelets: 187 10*3/uL (ref 150–440)
RBC: 4.46 MIL/uL (ref 4.40–5.90)
RDW: 13.4 % (ref 11.5–14.5)
WBC: 10.5 10*3/uL (ref 3.8–10.6)

## 2015-06-19 LAB — BASIC METABOLIC PANEL
Anion gap: 5 (ref 5–15)
BUN: 11 mg/dL (ref 6–20)
CHLORIDE: 107 mmol/L (ref 101–111)
CO2: 24 mmol/L (ref 22–32)
Calcium: 9 mg/dL (ref 8.9–10.3)
Creatinine, Ser: 0.67 mg/dL (ref 0.61–1.24)
GFR calc non Af Amer: >60 mL/min (ref >60)
Glucose, Bld: 167 mg/dL — ABNORMAL HIGH (ref 65–99)
POTASSIUM: 4.1 mmol/L (ref 3.5–5.1)
SODIUM: 136 mmol/L (ref 135–145)

## 2015-06-19 LAB — TROPONIN I: Troponin I: <0.03 ng/mL (ref <0.031)

## 2015-06-19 MED ORDER — PROCHLORPERAZINE MALEATE 10 MG PO TABS: 10.0000 mg | ORAL_TABLET | Freq: Three times a day (TID) | ORAL | Status: DC | PRN

## 2015-06-19 MED ORDER — SODIUM CHLORIDE 0.9 % IV BOLUS (SEPSIS)
1000.0000 mL | Freq: Once | INTRAVENOUS | Status: AC
  Administered 2015-06-19: 1000 mL via INTRAVENOUS

## 2015-06-19 MED ORDER — ALBUTEROL SULFATE (2.5 MG/3ML) 0.083% IN NEBU: 5.0000 mg | INHALATION_SOLUTION | Freq: Once | RESPIRATORY_TRACT | Status: DC

## 2015-06-19 MED ORDER — PROCHLORPERAZINE EDISYLATE 5 MG/ML IJ SOLN
10.0000 mg | Freq: Four times a day (QID) | INTRAMUSCULAR | Status: DC | PRN
  Administered 2015-06-19: 10 mg via INTRAVENOUS
  Filled 2015-06-19: qty 2

## 2015-06-19 NOTE — Discharge Instructions (Signed)
Please seek medical attention for any high fevers, chest pain, shortness of breath, change in behavior, persistent vomiting, bloody stool or any other new or concerning symptoms. ° ° °General Headache Without Cause °A headache is pain or discomfort felt around the head or neck area. There are many causes and types of headaches. In some cases, the cause may not be found.  °HOME CARE  °Managing Pain °· Take over-the-counter and prescription medicines only as told by your doctor. °· Lie down in a dark, quiet room when you have a headache. °· If directed, apply ice to the head and neck area: °¨ Put ice in a plastic bag. °¨ Place a towel between your skin and the bag. °¨ Leave the ice on for 20 minutes, 2-3 times per day. °· Use a heating pad or hot shower to apply heat to the head and neck area as told by your doctor. °· Keep lights dim if bright lights bother you or make your headaches worse. °Eating and Drinking °· Eat meals on a regular schedule. °· Lessen how much alcohol you drink. °· Lessen how much caffeine you drink, or stop drinking caffeine. °General Instructions °· Keep all follow-up visits as told by your doctor. This is important. °· Keep a journal to find out if certain things bring on headaches. For example, write down: °¨ What you eat and drink. °¨ How much sleep you get. °¨ Any change to your diet or medicines. °· Relax by getting a massage or doing other relaxing activities. °· Lessen stress. °· Sit up straight. Do not tighten (tense) your muscles. °· Do not use tobacco products. This includes cigarettes, chewing tobacco, or e-cigarettes. If you need help quitting, ask your doctor. °· Exercise regularly as told by your doctor. °· Get enough sleep. This often means 7-9 hours of sleep. °GET HELP IF: °· Your symptoms are not helped by medicine. °· You have a headache that feels different than the other headaches. °· You feel sick to your stomach (nauseous) or you throw up (vomit). °· You have a  fever. °GET HELP RIGHT AWAY IF:  °· Your headache becomes really bad. °· You keep throwing up. °· You have a stiff neck. °· You have trouble seeing. °· You have trouble speaking. °· You have pain in the eye or ear. °· Your muscles are weak or you lose muscle control. °· You lose your balance or have trouble walking. °· You feel like you will pass out (faint) or you pass out. °· You have confusion. °  °This information is not intended to replace advice given to you by your health care provider. Make sure you discuss any questions you have with your health care provider. °  °Document Released: 12/27/2007 Document Revised: 12/08/2014 Document Reviewed: 07/12/2014 °Elsevier Interactive Patient Education ©2016 [employer] Inc. ° °

## 2015-06-19 NOTE — ED Provider Notes (Signed)
[facility] Emergency Department Provider Note    ____________________________________________  Time seen: ~1605  I have reviewed the triage vital signs and the nursing notes.   HISTORY  Chief Complaint Shortness of Breath and Pleurisy   History limited by: Not Limited   HPI Kevin Boyd is a 50 y.o. male with history of complex migraines and pulmonary fibrosis who presents to the emergency department today after an episode of chest burning. Patient states that it started today. It did become severe. He describes it as burning. It is located centrally. It is still present however has improved. In addition the patient states he's been getting a worse headache. This is been going on for a couple of days. It is now severe. Because of this he has had some stuttering speech. He does have a history of complex migraines with similar presentations. Family states he has had neuroimaging done without any acute abnormalities. No recent fevers. No nausea or vomiting.   Past Medical History  Diagnosis Date  . Sleep apnea   . Interstitial lung disease (HCC)   . Bipolar 1 disorder (HCC)   . GERD (gastroesophageal reflux disease)   . Seasonal allergies   . Scoliosis   . COPD (chronic obstructive pulmonary disease) (HCC)   . Asthma   . C. difficile diarrhea   . Borderline diabetes mellitus     Patient Active Problem List   Diagnosis Date Noted  . Cough 04/25/2015  . CAP (community acquired pneumonia) 03/17/2015  . Migraine 11/05/2014  . Hyperlipidemia 11/04/2014  . Migraine headache 11/03/2014  . Borderline diabetes mellitus 11/03/2014  . Bipolar 1 disorder (HCC) 11/03/2014  . Fatigue 10/05/2014  . GERD (gastroesophageal reflux disease) 06/22/2014  . Chest pain 04/06/2014  . Shortness of breath 03/04/2014  . Solitary pulmonary nodule 03/04/2014  . IPF (idiopathic pulmonary fibrosis) (HCC) 02/17/2014    Past Surgical History  Procedure Laterality Date  .  Back surgery      lower lumbar fusion  . Thumb surgery    . Lung surgery      biopsy    Current Outpatient Rx  Name  Route  Sig  Dispense  Refill  . albuterol (PROVENTIL HFA;VENTOLIN HFA) 108 (90 BASE) MCG/ACT inhaler   Inhalation   Inhale 2 puffs into the lungs every 6 (six) hours as needed for wheezing or shortness of breath.         Marland Kitchen aspirin EC 81 MG tablet   Oral   Take 1 tablet (81 mg total) by mouth daily.   30 tablet   0   . atorvastatin (LIPITOR) 40 MG tablet   Oral   Take 1 tablet (40 mg total) by mouth daily at 6 PM.   30 tablet   0   . Bioflavonoid Products (ESTER C PO)   Oral   Take 1 tablet by mouth daily.         . carbamazepine (TEGRETOL XR) 100 MG 12 hr tablet   Oral   Take 100-200 mg by mouth 2 (two) times daily. 1 tab qam, 2 tabs qhs         . Cholecalciferol (VITAMIN D-3) 5000 UNITS TABS   Oral   Take 1 tablet by mouth daily.         Marland Kitchen docusate sodium (COLACE) 100 MG capsule   Oral   Take 100 mg by mouth at bedtime.         Marland Kitchen esomeprazole (NEXIUM) 40 MG capsule  Oral   Take 1 capsule (40 mg total) by mouth 2 (two) times daily before a meal.   60 capsule   3   . ferrous gluconate (FERGON) 324 MG tablet   Oral   Take 324 mg by mouth every other day. At night         . fluticasone (FLONASE) 50 MCG/ACT nasal spray   Each Nare   Place 2 sprays into both nostrils daily.         Marland Kitchen. lamoTRIgine (LAMICTAL) 200 MG tablet   Oral   Take 200 mg by mouth 2 (two) times daily.         . Pirfenidone 267 MG CAPS   Oral   Take 2 capsules by mouth 3 (three) times daily.   180 capsule   11   . predniSONE (DELTASONE) 10 MG tablet      Take  4 each am x 2 days,   2 each am x 2 days,  1 each am x 2 days and stop   14 tablet   0   . Probiotic Product (PROBIOTIC DAILY PO)   Oral   Take 1 tablet by mouth daily.         Marland Kitchen. Respiratory Therapy Supplies (FLUTTER) DEVI      Use as directed   1 each   0   . traMADol (ULTRAM) 50 MG  tablet      1-2 every 4 hours as needed for cough or pain   40 tablet   0     Allergies Review of patient's allergies indicates no known allergies.  Family History  Problem Relation Age of Onset  . Pulmonary fibrosis Mother   . Sjogren's syndrome Mother   . Pulmonary fibrosis Maternal Grandmother     Social History Social History  Substance Use Topics  . Smoking status: Former Smoker -- 2.00 packs/day for 30 years    Types: Cigarettes    Quit date: 12/28/2013  . Smokeless tobacco: Former NeurosurgeonUser  . Alcohol Use: None    Review of Systems  Constitutional: Negative for fever. Cardiovascular: Positive for chest pain Respiratory: Positive for shortness breath Gastrointestinal: Negative for abdominal pain, vomiting and diarrhea. Neurological: Positive for head ache and stuttering speech  10-point ROS otherwise negative.  ____________________________________________   PHYSICAL EXAM:  VITAL SIGNS: ED Triage Vitals  Enc Vitals Group     BP 06/19/15 1526 127/81 mmHg     Pulse Rate 06/19/15 1526 109     Resp 06/19/15 1526 22     Temp 06/19/15 1526 97.6 F (36.4 C)     Temp Source 06/19/15 1526 Oral     SpO2 06/19/15 1526 93 %     Weight 06/19/15 1526 278 lb (126.1 kg)     Height 06/19/15 1526 6\' 2"  (1.88 m)     Head Cir --      Peak Flow --      Pain Score 06/19/15 1527 8   Constitutional: Alert and oriented. Well appearing and in no distress. Eyes: Conjunctivae are normal. PERRL. Normal extraocular movements. ENT   Head: Normocephalic and atraumatic.   Nose: No congestion/rhinnorhea.   Mouth/Throat: Mucous membranes are moist.   Neck: No stridor. Hematological/Lymphatic/Immunilogical: No cervical lymphadenopathy. Cardiovascular: Normal rate, regular rhythm.  No murmurs, rubs, or gallops. Respiratory: Normal respiratory effort without tachypnea nor retractions. Breath sounds are clear and equal bilaterally. No  wheezes/rales/rhonchi. Gastrointestinal: Soft and nontender. No distention. There is no CVA tenderness. Genitourinary: Deferred Musculoskeletal:  Normal range of motion in all extremities. No joint effusions.  No lower extremity tenderness nor edema. Neurologic:  Normal speech and language. No gross focal neurologic deficits are appreciated.  Skin:  Skin is warm, dry and intact. No rash noted. Psychiatric: Mood and affect are normal. Speech and behavior are normal. Patient exhibits appropriate insight and judgment.  ____________________________________________    LABS (pertinent positives/negatives)  Labs Reviewed  BASIC METABOLIC PANEL - Abnormal; Notable for the following:    Glucose, Bld 167 (*)    All other components within normal limits  CBC - Abnormal; Notable for the following:    HCT 39.6 (*)    All other components within normal limits  TROPONIN I     ____________________________________________   EKG  I, Phineas Semen, attending physician, personally viewed and interpreted this EKG  EKG Time: 1529 Rate: 108 Rhythm: normal sinus tachycardia Axis: left axis deviation Intervals: qtc 426 QRS: incomplete RBBB ST changes: no st elevation Impression: abnormal ekg ____________________________________________    RADIOLOGY  CT head IMPRESSION: 1. No acute abnormality. 2. Stable minimal chronic small vessel white matter ischemic changes in both cerebral hemispheres, compatible with the history of migraine headaches.  CXR  IMPRESSION: Chronic fibrotic changes without acute abnormality.  ____________________________________________   PROCEDURES  Procedure(s) performed: None  Critical Care performed: No  ____________________________________________   INITIAL IMPRESSION / ASSESSMENT AND PLAN / ED COURSE  Pertinent labs & imaging results that were available during my care of the patient were reviewed by me and considered in my medical decision making  (see chart for details).  Patient presented to the emergency department today for some chest burning and concerns for headache. Patient was given IV medications for the headache, was able to nap and did feel better. CT head and chest x-ray without concerning acute findings. Blood work without any leukocytosis to suggest infection. Troponin negative. Given the patient was feeling better feel he is safe for discharge. Will get neurology follow-up.  ____________________________________________   FINAL CLINICAL IMPRESSION(S) / ED DIAGNOSES  Final diagnoses:  Acute nonintractable headache, unspecified headache type     Phineas Semen, MD 06/19/15 1946

## 2015-06-19 NOTE — ED Notes (Signed)
Goodman MD at bedside. 

## 2015-06-19 NOTE — ED Notes (Addendum)
Pt presents to ED with c/o of difficulty catching his breath today with real bad headache. Pt states he has hx of lung surgery. Pt has taken albuterol, nebs without relief. Pt describes symptoms as burning in his central chest. Pt is alert and oriented x4.

## 2015-07-18 ENCOUNTER — Other Ambulatory Visit: Payer: Self-pay | Admitting: Pulmonary Disease

## 2015-07-18 DIAGNOSIS — R0600 Dyspnea, unspecified: Secondary | ICD-10-CM

## 2015-07-19 ENCOUNTER — Ambulatory Visit: Payer: BLUE CROSS/BLUE SHIELD | Admitting: Pulmonary Disease

## 2015-07-19 ENCOUNTER — Ambulatory Visit: Payer: BLUE CROSS/BLUE SHIELD

## 2015-08-16 ENCOUNTER — Other Ambulatory Visit: Payer: Self-pay | Admitting: Physical Medicine and Rehabilitation

## 2015-08-16 DIAGNOSIS — M5416 Radiculopathy, lumbar region: Secondary | ICD-10-CM

## 2015-08-19 ENCOUNTER — Other Ambulatory Visit: Payer: Self-pay | Admitting: Physical Medicine and Rehabilitation

## 2015-08-19 DIAGNOSIS — M5136 Other intervertebral disc degeneration, lumbar region: Secondary | ICD-10-CM

## 2015-08-19 DIAGNOSIS — M5416 Radiculopathy, lumbar region: Secondary | ICD-10-CM

## 2015-09-07 ENCOUNTER — Ambulatory Visit: Admission: RE | Admit: 2015-09-07 | Payer: BLUE CROSS/BLUE SHIELD | Source: Ambulatory Visit

## 2015-11-07 ENCOUNTER — Other Ambulatory Visit: Payer: Self-pay | Admitting: Pulmonary Disease

## 2015-11-09 ENCOUNTER — Telehealth: Payer: Self-pay | Admitting: Pulmonary Disease

## 2015-11-09 NOTE — Telephone Encounter (Signed)
Called spoke with pt. He states he was driving down the road and realized it had been months since he saw BQ. He states he was not aware of the PFT and ov scheduled on 07/19/15 with BQ and would like to reschedule his ov. I scheduled him on 01/10/16 with a PFT prior. Pt voiced understanding and had no further questions. Nothing further needed.

## 2016-01-10 ENCOUNTER — Encounter (INDEPENDENT_AMBULATORY_CARE_PROVIDER_SITE_OTHER): Payer: BLUE CROSS/BLUE SHIELD | Admitting: Pulmonary Disease

## 2016-01-10 ENCOUNTER — Encounter: Payer: Self-pay | Admitting: Pulmonary Disease

## 2016-01-10 ENCOUNTER — Ambulatory Visit (INDEPENDENT_AMBULATORY_CARE_PROVIDER_SITE_OTHER)
Admission: RE | Admit: 2016-01-10 | Discharge: 2016-01-10 | Disposition: A | Discharge status: Home / Self Care | Payer: BLUE CROSS/BLUE SHIELD | Source: Ambulatory Visit | Attending: Pulmonary Disease | Admitting: Pulmonary Disease

## 2016-01-10 ENCOUNTER — Ambulatory Visit (INDEPENDENT_AMBULATORY_CARE_PROVIDER_SITE_OTHER): Payer: BLUE CROSS/BLUE SHIELD | Admitting: Pulmonary Disease

## 2016-01-10 DIAGNOSIS — R0600 Dyspnea, unspecified: Secondary | ICD-10-CM | POA: Diagnosis not present

## 2016-01-10 DIAGNOSIS — Z23 Encounter for immunization: Secondary | ICD-10-CM | POA: Diagnosis not present

## 2016-01-10 DIAGNOSIS — R05 Cough: Secondary | ICD-10-CM

## 2016-01-10 DIAGNOSIS — J84112 Idiopathic pulmonary fibrosis: Secondary | ICD-10-CM

## 2016-01-10 DIAGNOSIS — R059 Cough, unspecified: Secondary | ICD-10-CM

## 2016-01-10 LAB — PULMONARY FUNCTION TEST
DL/VA % pred: 75 %
DL/VA: 3.67 ml/min/mmHg/L
DLCO UNC % PRED: 45 %
DLCO UNC: 17.31 ml/min/mmHg
DLCO cor % pred: 43 %
DLCO cor: 16.41 ml/min/mmHg
FEF 25-75 POST: 5.05 L/s
FEF 25-75 PRE: 3.25 L/s
FEF2575-%Change-Post: 55 %
FEF2575-%PRED-POST: 130 %
FEF2575-%Pred-Pre: 84 %
FEV1-%Change-Post: 10 %
FEV1-%PRED-POST: 70 %
FEV1-%Pred-Pre: 63 %
FEV1-POST: 3.13 L
FEV1-PRE: 2.84 L
FEV1FVC-%Change-Post: 4 %
FEV1FVC-%PRED-PRE: 109 %
FEV6-%Change-Post: 5 %
FEV6-%PRED-POST: 63 %
FEV6-%PRED-PRE: 59 %
FEV6-POST: 3.52 L
FEV6-Pre: 3.33 L
FEV6FVC-%PRED-POST: 103 %
FEV6FVC-%Pred-Pre: 103 %
FVC-%CHANGE-POST: 5 %
FVC-%PRED-PRE: 57 %
FVC-%Pred-Post: 61 %
FVC-POST: 3.52 L
FVC-Pre: 3.33 L
POST FEV6/FVC RATIO: 100 %
PRE FEV1/FVC RATIO: 85 %
PRE FEV6/FVC RATIO: 100 %
Post FEV1/FVC ratio: 89 %
RV % PRED: 53 %
RV: 1.21 L
TLC % PRED: 59 %
TLC: 4.66 L

## 2016-01-10 NOTE — Assessment & Plan Note (Signed)
No doubt there is some degree of irritable larynx syndrome but his acid reflux and postnasal drip seem to be fairly well controlled. I think is IPF also contributes. No change in medication recommended at this time.

## 2016-01-10 NOTE — Progress Notes (Signed)
Subjective:    Patient ID: Kevin Boyd, male    DOB: 1966-03-18, 50 y.o.   MRN: [id number]  Synopsis: First evaluated by [facility] in 2015 for usual interstitial pneumonitis found on a biopsy. Kevin Boyd Boyd a family history significant for pulmonary fibrosis in that his mother died and Kevin Boyd Boyd a brother with the disease. Serology panel in 2015 was negative for connective tissue disease.  Diagnosed with IPF.  09/2002 [place] CXR showed infiltrates transiently that cleared on a follow up film one month later 01/2014 ANA, RF, SCL-70, SSA/SSB, Aldolase, Anti-Jo-1, centromere all neg August 2015 CT chest> There is groundglass opacification, scattered intralobular septal thickening, traction bronchiectasis and peripheral based honeycombing. The majority of the interstitial changes are peripheral based. There does not appear to be a craniocaudal gradient 12/28/2013 open lung biopsy: pulmonary fibrosis, severe, 1 sections show honeycombing, fibroblastic foci and overall variegated appearance with subpleural accentuation fibrosis, most consistent with usual interstitial pneumonitis. Granulomas are not seen. 03/22/2014 PFT> Ratio 82%, FEV1 2.91L (74% pred, 8% pred), TLCO 4.67L (62% pred), DLCO 35.4 (45% pred) 04/2014 1660 feet, O2 98% RA July 2016 pulmonary function testing> ratio 72%, FEV1 2.44 L (54% Pred), forced vital capacity 3.39 L (58% predicted), total lung capacity 5.48 L (70% predicted), DLCO 22.72 (60% predicted) . July 2016 6 minute walk> 372 m) 1220 feet) O2 sat 96% October 2016 pulmonary function testing ratio 88%, FEV1 3.47 L (77% predicted), total lung capacity 5.38 L (69% productive) sees, DLCO 20.55 (54% predicted) October 2016 6 minute walk test 479 m, O2 saturation 97%. 05/2015 CT sinus > normal    HPI Chief Complaint  Patient presents with  . Follow-up    review PFT.  Pt c/o worsening sob, nonprod cough with any exertion.  Pt will have runny nose with his coughing spells.      Kevin Boyd hasn't seen me in quite some time.  Kevin Boyd Boyd some dyspnea on exertion and becomes breathless with what Kevin Boyd considers minimal exertion such as carrying light to medium sized boxes. Kevin Boyd Boyd also been coughing a lot with this and Kevin Boyd attributes this to sinus congestion and a runny nose.  The runny nose occurs exclusively when Kevin Boyd coughs.  Kevin Boyd Boyd not been taking the flonase, but Kevin Boyd Boyd been taking a once a day allergy pill.     Kevin Boyd is still taking Esbriet 2 pills three times per day because Kevin Boyd was unable to tolerate the 3 pills three times per day.  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    Kevin Boyd still Boyd problems with speech related to the stroke.  Kevin Boyd Boyd been dizzy more lately.     Past Medical History:  Diagnosis Date  . Asthma   . Bipolar 1 disorder (HCC)   . Borderline diabetes mellitus   . C. difficile diarrhea   . COPD (chronic obstructive pulmonary disease) (HCC)   . GERD (gastroesophageal reflux disease)   . Interstitial lung disease (HCC)   . Scoliosis   . Seasonal allergies   . Sleep apnea       Review of Systems  Constitutional: Negative for chills and fever.  HENT: Negative for postnasal drip, rhinorrhea and sinus pressure.   Respiratory: Positive for shortness of breath. Negative for wheezing.   Cardiovascular: Negative for chest pain, palpitations and leg swelling.  Gastrointestinal: Negative for diarrhea.  Objective:   Physical Exam Vitals:   01/10/16 1337  BP: 130/76  Pulse: 78  SpO2: 95%  Weight: 293 lb (132.9 kg)  Height: 6\' 2"  (1.88 m)  RA    Gen: well appearing, no acute distress HEENT: NCAT, EOMi, OP clear,  PULM: Crackles bilaterally in bases, no wheezing CV: RRR, no mgr, no JVD AB: BS+, soft, nontender,  Ext: warm, no edema, no clubbing, no cyanosis Derm: no rash or skin breakdown Neuro: A&Ox4, MAEW   Labs from August 2017 reviewed were  Kevin Boyd was found to have a normal kidney function and normal liver function, triglycerides were elevated.  Her function testing from today reviewed     Assessment & Plan:   IPF (idiopathic pulmonary fibrosis) (HCC) Kevin Boyd been compliant with his Esbriet but I admonished him today for not being compliant with follow-up visits as I have not seen him in a year.  His lung function testing today showed progressive decline over all results that we have available since we began to see him in clinic.  I again reminded him today that idiopathic pulmonary fibrosis is a progressive condition and his lung function testing is indicative of that.  I explained again that anti-fibrotic agents only slow the progression of disease and there is no clear evidence from the literature that changing him from one drug to another or changing the dose of his Esbriet at this time would be effective. It should be noted that Kevin Boyd was unable to take 3 pills of Esbriet 3 times a day.  Plan: Continue Esbriet Repeat pulmonary function testing in 6 months Exercise regularly, lose weight Keep clinic appointments May need to refer to [facility] transplant within the next 12 months given his progressive decline but I explained to him today that Kevin Boyd would not be considered a transplantation candidate if Kevin Boyd doesn't start exercising and start showing up for his clinic appointments.  Cough No doubt there is some degree of irritable larynx syndrome but his acid reflux and postnasal drip seem to be fairly well controlled. I think is IPF also contributes. No change in medication recommended at this time.  > 50% of today's 28 minute visit spent face to face Updated Medication List Outpatient Encounter Prescriptions as of 01/10/2016  Medication Sig  . albuterol (PROVENTIL HFA;VENTOLIN HFA) 108 (90 BASE) MCG/ACT inhaler Inhale 2 puffs into the lungs every 6 (six) hours as needed for wheezing or shortness of breath.  Marland Kitchen. aspirin EC 81 MG  tablet Take 1 tablet (81 mg total) by mouth daily.  Marland Kitchen. atorvastatin (LIPITOR) 40 MG tablet Take 1 tablet (40 mg total) by mouth daily at 6 PM.  . Bioflavonoid Products (ESTER C PO) Take 1 tablet by mouth daily.  . carbamazepine (TEGRETOL XR) 100 MG 12 hr tablet Take 100-200 mg by mouth 2 (two) times daily. 1 tab qam, 2 tabs qhs  . Cholecalciferol (VITAMIN D-3) 5000 UNITS TABS Take 1 tablet by mouth daily.  Marland Kitchen. docusate sodium (COLACE) 100 MG capsule Take 100 mg by mouth at bedtime.  Marland Kitchen. esomeprazole (NEXIUM) 40 MG capsule Take 1 capsule (40 mg total) by mouth 2 (two) times daily before a meal.  . esomeprazole (NEXIUM) 40 MG capsule TAKE 1 BY MOUTH TWICE DAILY BEFORE MEALS  . ferrous gluconate (FERGON) 324 MG tablet Take 324 mg by mouth every other day. At night  . fluticasone (FLONASE) 50 MCG/ACT nasal spray Place 2 sprays into both nostrils daily.  Marland Kitchen. lamoTRIgine (LAMICTAL) 200 MG tablet  Take 200 mg by mouth 2 (two) times daily.  . Pirfenidone 267 MG CAPS Take 2 capsules by mouth 3 (three) times daily.  . Probiotic Product (PROBIOTIC DAILY PO) Take 1 tablet by mouth daily.  . prochlorperazine (COMPAZINE) 10 MG tablet Take 1 tablet (10 mg total) by mouth every 8 (eight) hours as needed (headache).  Marland Kitchen Respiratory Therapy Supplies (FLUTTER) DEVI Use as directed  . traMADol (ULTRAM) 50 MG tablet 1-2 every 4 hours as needed for cough or pain  . [DISCONTINUED] predniSONE (DELTASONE) 10 MG tablet Take  4 each am x 2 days,   2 each am x 2 days,  1 each am x 2 days and stop (Patient not taking: Reported on 01/10/2016)   No facility-administered encounter medications on file as of 01/10/2016.

## 2016-01-10 NOTE — Assessment & Plan Note (Signed)
Kevin MaduroRobert has been compliant with his Esbriet but I admonished him today for not being compliant with follow-up visits as I have not seen him in a year.  His lung function testing today showed progressive decline over all results that we have available since we began to see him in clinic.  I again reminded him today that idiopathic pulmonary fibrosis is a progressive condition and his lung function testing is indicative of that.  I explained again that anti-fibrotic agents only slow the progression of disease and there is no clear evidence from the literature that changing him from one drug to another or changing the dose of his Esbriet at this time would be effective. It should be noted that he was unable to take 3 pills of Esbriet 3 times a day.  Plan: Continue Esbriet Repeat pulmonary function testing in 6 months Exercise regularly, lose weight Keep clinic appointments May need to refer to [facility] transplant within the next 12 months given his progressive decline but I explained to him today that he would not be considered a transplantation candidate if he doesn't start exercising and start showing up for his clinic appointments.

## 2016-01-10 NOTE — Patient Instructions (Signed)
Keep taking the Esbriet you're doing We will see you back in 3 months We will arrange for another pulmonary function test in 6 months We'll call you with the results of today's chest x-ray

## 2016-01-12 ENCOUNTER — Telehealth: Payer: Self-pay | Admitting: Pulmonary Disease

## 2016-01-12 NOTE — Telephone Encounter (Signed)
Notes Recorded by Lupita Leashouglas B McQuaid, MD on 01/11/2016 at 8:08 AM EDT A, Please let the patient know this showed findings of his IPF but nothing else. Thanks, B  ------------ Left message on voicemail for patient to call back.

## 2016-01-12 NOTE — Telephone Encounter (Signed)
Spoke with pt, aware of results/recs.  Nothing further needed.  

## 2016-01-12 NOTE — Telephone Encounter (Signed)
Patient calling back -pr  °

## 2016-01-17 ENCOUNTER — Emergency Department
Admission: EM | Admit: 2016-01-17 | Discharge: 2016-01-17 | Disposition: A | Discharge status: Home / Self Care | Payer: BLUE CROSS/BLUE SHIELD | Attending: Emergency Medicine | Admitting: Emergency Medicine

## 2016-01-17 ENCOUNTER — Emergency Department: Payer: BLUE CROSS/BLUE SHIELD

## 2016-01-17 ENCOUNTER — Encounter: Payer: Self-pay | Admitting: Emergency Medicine

## 2016-01-17 DIAGNOSIS — J449 Chronic obstructive pulmonary disease, unspecified: Secondary | ICD-10-CM | POA: Diagnosis not present

## 2016-01-17 DIAGNOSIS — Y9389 Activity, other specified: Secondary | ICD-10-CM | POA: Diagnosis not present

## 2016-01-17 DIAGNOSIS — Z7982 Long term (current) use of aspirin: Secondary | ICD-10-CM | POA: Insufficient documentation

## 2016-01-17 DIAGNOSIS — S61259A Open bite of unspecified finger without damage to nail, initial encounter: Secondary | ICD-10-CM

## 2016-01-17 DIAGNOSIS — Z79899 Other long term (current) drug therapy: Secondary | ICD-10-CM | POA: Diagnosis not present

## 2016-01-17 DIAGNOSIS — S61256A Open bite of right little finger without damage to nail, initial encounter: Secondary | ICD-10-CM | POA: Diagnosis not present

## 2016-01-17 DIAGNOSIS — W540XXA Bitten by dog, initial encounter: Secondary | ICD-10-CM | POA: Diagnosis not present

## 2016-01-17 DIAGNOSIS — Y929 Unspecified place or not applicable: Secondary | ICD-10-CM | POA: Diagnosis not present

## 2016-01-17 DIAGNOSIS — Y999 Unspecified external cause status: Secondary | ICD-10-CM | POA: Diagnosis not present

## 2016-01-17 DIAGNOSIS — S61451A Open bite of right hand, initial encounter: Secondary | ICD-10-CM

## 2016-01-17 DIAGNOSIS — S61254A Open bite of right ring finger without damage to nail, initial encounter: Secondary | ICD-10-CM | POA: Diagnosis not present

## 2016-01-17 DIAGNOSIS — Z87891 Personal history of nicotine dependence: Secondary | ICD-10-CM | POA: Diagnosis not present

## 2016-01-17 DIAGNOSIS — J45909 Unspecified asthma, uncomplicated: Secondary | ICD-10-CM | POA: Diagnosis not present

## 2016-01-17 MED ORDER — HYDROCODONE-ACETAMINOPHEN 5-325 MG PO TABS
1.0000 | ORAL_TABLET | ORAL | 0 refills | Status: DC | PRN
Start: 2016-01-17 — End: 2016-02-15

## 2016-01-17 MED ORDER — AMOXICILLIN-POT CLAVULANATE 875-125 MG PO TABS: 1.0000 | ORAL_TABLET | Freq: Two times a day (BID) | ORAL | 0 refills | Status: AC

## 2016-01-17 NOTE — ED Notes (Signed)
See triage note  Puncture wounds to right hand

## 2016-01-17 NOTE — Discharge Instructions (Signed)
WOUND CARE  Keep area clean and dry for 24 hours. Do not remove bandage, if applied.  After 24 hours, remove bandage and wash wound gently with mild soap and warm water. Reapply a new bandage after cleaning wound, if directed.  Continue daily cleansing with soap and water   Do not apply any ointments or creams to the wound while stitches/staples are in place, as this may cause delayed healing.  Notify the office if you experience any of the following signs of infection: Swelling, redness, pus drainage, streaking, fever >101.0 F  Notify the office if you experience excessive bleeding that does not stop after 15-20 minutes of constant, firm pressure. 

## 2016-01-17 NOTE — ED Triage Notes (Signed)
Dog bite to right hand

## 2016-01-17 NOTE — ED Notes (Signed)
Placed hand in surgical soap/saline mix

## 2016-01-17 NOTE — ED Provider Notes (Signed)
[facility] Emergency Department Provider Note   ____________________________________________   First MD Initiated Contact with Patient 01/17/16 0840     (approximate)  I have reviewed the triage vital signs and the nursing notes.   HISTORY  Chief Complaint Animal Bite   HPI Kevin Boyd is a 51 y.o. male presents with an animal bite to the right hand.  This morning, the patient and his wife broke up a dog fight between their two dogs and got bit. Pt states that the dog is UTD on immunizations and has not had recent foaming at the mouth or erratic behavior.  Pt can't recall when he last had a tetanus shot but got it at [facility].    Past Medical History:  Diagnosis Date  . Asthma   . Bipolar 1 disorder (HCC)   . Borderline diabetes mellitus   . C. difficile diarrhea   . COPD (chronic obstructive pulmonary disease) (HCC)   . GERD (gastroesophageal reflux disease)   . Interstitial lung disease (HCC)   . Scoliosis   . Seasonal allergies   . Sleep apnea     Patient Active Problem List   Diagnosis Date Noted  . Cough 04/25/2015  . CAP (community acquired pneumonia) 03/17/2015  . Migraine 11/05/2014  . Hyperlipidemia 11/04/2014  . Migraine headache 11/03/2014  . Borderline diabetes mellitus 11/03/2014  . Bipolar 1 disorder (HCC) 11/03/2014  . Fatigue 10/05/2014  . GERD (gastroesophageal reflux disease) 06/22/2014  . Chest pain 04/06/2014  . Shortness of breath 03/04/2014  . Solitary pulmonary nodule 03/04/2014  . IPF (idiopathic pulmonary fibrosis) (HCC) 02/17/2014    Past Surgical History:  Procedure Laterality Date  . BACK SURGERY     lower lumbar fusion  . LUNG SURGERY     biopsy  . thumb surgery      Prior to Admission medications   Medication Sig Start Date End Date Taking? Authorizing Provider  albuterol (PROVENTIL HFA;VENTOLIN HFA) 108 (90 BASE) MCG/ACT inhaler Inhale 2 puffs into the lungs every 6 (six) hours as needed  for wheezing or shortness of breath.    Historical Provider, MD  amoxicillin-clavulanate (AUGMENTIN) 875-125 MG tablet Take 1 tablet by mouth 2 (two) times daily. 01/17/16 01/24/16  Tommi Rumps, PA-C  aspirin EC 81 MG tablet Take 1 tablet (81 mg total) by mouth daily. 11/04/14   Shaune Pollack, MD  atorvastatin (LIPITOR) 40 MG tablet Take 1 tablet (40 mg total) by mouth daily at 6 PM. 11/04/14   Shaune Pollack, MD  Bioflavonoid Products (ESTER C PO) Take 1 tablet by mouth daily.    Historical Provider, MD  carbamazepine (TEGRETOL XR) 100 MG 12 hr tablet Take 100-200 mg by mouth 2 (two) times daily. 1 tab qam, 2 tabs qhs    Historical Provider, MD  Cholecalciferol (VITAMIN D-3) 5000 UNITS TABS Take 1 tablet by mouth daily.    Historical Provider, MD  docusate sodium (COLACE) 100 MG capsule Take 100 mg by mouth at bedtime.    Historical Provider, MD  esomeprazole (NEXIUM) 40 MG capsule Take 1 capsule (40 mg total) by mouth 2 (two) times daily before a meal. 11/10/14   [name], MD  esomeprazole (NEXIUM) 40 MG capsule TAKE 1 BY MOUTH TWICE DAILY BEFORE MEALS 11/07/15   [name], MD  ferrous gluconate (FERGON) 324 MG tablet Take 324 mg by mouth every other day. At night    Historical Provider, MD  fluticasone (FLONASE) 50 MCG/ACT nasal spray  Place 2 sprays into both nostrils daily.    Historical Provider, MD  HYDROcodone-acetaminophen (NORCO/VICODIN) 5-325 MG tablet Take 1 tablet by mouth every 4 (four) hours as needed for moderate pain. 01/17/16   Tommi Rumpshonda L Kista Robb, PA-C  lamoTRIgine (LAMICTAL) 200 MG tablet Take 200 mg by mouth 2 (two) times daily.    Historical Provider, MD  Pirfenidone 267 MG CAPS Take 2 capsules by mouth 3 (three) times daily. 05/16/15   Lupita Leashouglas B McQuaid, MD  Probiotic Product (PROBIOTIC DAILY PO) Take 1 tablet by mouth daily.    Historical Provider, MD  Respiratory Therapy Supplies (FLUTTER) DEVI Use as directed 03/17/15   Lupita Leashouglas B McQuaid, MD    Allergies Review of  patient's allergies indicates no known allergies.  Family History  Problem Relation Age of Onset  . Pulmonary fibrosis Mother   . Sjogren's syndrome Mother   . Pulmonary fibrosis Maternal Grandmother     Social History Social History  Substance Use Topics  . Smoking status: Former Smoker    Packs/day: 2.00    Years: 30.00    Types: Cigarettes    Quit date: 12/28/2013  . Smokeless tobacco: Former NeurosurgeonUser  . Alcohol use Not on file    Review of Systems Constitutional: No fever/chills Eyes: No visual changes. ENT: No sore throat. Cardiovascular: Denies chest pain. Respiratory: Denies shortness of breath. Gastrointestinal:  No nausea, no vomiting.   Genitourinary: Negative for dysuria. Musculoskeletal: Admits to pain mostly on the pad of the pinky finger. No weakness or N/T.  Skin: Negative for rash. Neurological: Negative for headaches, focal weakness or numbness. 10-point ROS otherwise negative.  ____________________________________________   PHYSICAL EXAM:  VITAL SIGNS: ED Triage Vitals  Enc Vitals Group     BP 01/17/16 0806 135/90     Pulse Rate 01/17/16 0806 87     Resp 01/17/16 0806 16     Temp 01/17/16 0806 97.7 F (36.5 C)     Temp Source 01/17/16 0806 Oral     SpO2 01/17/16 0806 93 %     Weight 01/17/16 0807 293 lb (132.9 kg)     Height 01/17/16 0807 6\' 2"  (1.88 m)     Head Circumference --      Peak Flow --      Pain Score --      Pain Loc --      Pain Edu? --      Excl. in GC? --    Constitutional: Alert and oriented. Well appearing and in no acute distress. Eyes: Conjunctivae are normal. PERRL. EOMI. Head: Atraumatic. Nose: No congestion/rhinnorhea. Mouth/Throat: Mucous membranes are moist.  Oropharynx non-erythematous. Cardiovascular: Normal rate, regular rhythm. Grossly normal heart sounds.  Good peripheral circulation. Respiratory: Normal respiratory effort.  No retractions. Lungs CTAB. Gastrointestinal: Soft and nontender. No distention. No  abdominal bruits. No CVA tenderness. Musculoskeletal: 7 small puncture like lacerations on the 4th and 5th fingers of the right hand. Fifth finger has decreased AROM and PROM. Tenderness to palpation of DIP and PIP on the fifth finger. Muscle strength of fifth finger is 3/5 with extension and flexion.  Muscle strength is 3/5 for adduction and 4/5 for abduction of fifth finger. Fourth finger has normal AROM and PROM.  Muscle strength is 5/5 for extension, flexion, abduction and adduction on fourth finger. Wrist has normal AROM BIL.  Neurologic:  Normal speech and language. No gross focal neurologic deficits are appreciated. No gait instability. Skin:  Skin is warm, dry and intact. No rash noted.  Psychiatric: Mood and affect are normal. Speech and behavior are normal.  ____________________________________________   LABS (all labs ordered are listed, but only abnormal results are displayed)  Labs Reviewed - No data to display  RADIOLOGY  Right hand x-ray per radiologist IMPRESSION:  No acute abnormality. No radiopaque foreign body.    I, Tommi Rumps, personally viewed and evaluated these images (plain radiographs) as part of my medical decision making, as well as reviewing the written report by the radiologist.  ____________________________________________   PROCEDURES  Procedure(s) performed: None  Procedures  Critical Care performed: No  ____________________________________________   INITIAL IMPRESSION / ASSESSMENT AND PLAN / ED COURSE  Pertinent labs & imaging results that were available during my care of the patient were reviewed by me and considered in my medical decision making (see chart for details).    Clinical Course  Patient was placed on Augmentin 875 twice a day for 7 days. He is aware that he needs to clean the area twice daily with mild soap and water. It was stressed to him the importance of watching for signs of infection and return to the emergency  room immediately in that hand infections are extremely important. He is also given a prescription for Norco one every 4 hours as needed for pain.   ___________________________________________   FINAL CLINICAL IMPRESSION(S) / ED DIAGNOSES  Final diagnoses:  Dog bite of multiple sites of right hand and fingers, initial encounter      NEW MEDICATIONS STARTED DURING THIS VISIT:  Discharge Medication List as of 01/17/2016  9:58 AM    START taking these medications   Details  amoxicillin-clavulanate (AUGMENTIN) 875-125 MG tablet Take 1 tablet by mouth 2 (two) times daily., Starting Tue 01/17/2016, Until Tue 01/24/2016, Print    HYDROcodone-acetaminophen (NORCO/VICODIN) 5-325 MG tablet Take 1 tablet by mouth every 4 (four) hours as needed for moderate pain., Starting Tue 01/17/2016, Print         Note:  This document was prepared using Dragon voice recognition software and may include unintentional dictation errors.    Tommi Rumps, PA-C 01/17/16 1132    Emily Filbert, MD 01/17/16 [phone]

## 2016-02-13 ENCOUNTER — Observation Stay
Admission: EM | Admit: 2016-02-13 | Discharge: 2016-02-15 | Disposition: A | Discharge status: Home / Self Care | Payer: BLUE CROSS/BLUE SHIELD | Attending: Internal Medicine | Admitting: Internal Medicine

## 2016-02-13 ENCOUNTER — Encounter: Payer: Self-pay | Admitting: Emergency Medicine

## 2016-02-13 ENCOUNTER — Emergency Department: Payer: BLUE CROSS/BLUE SHIELD

## 2016-02-13 DIAGNOSIS — G459 Transient cerebral ischemic attack, unspecified: Secondary | ICD-10-CM

## 2016-02-13 DIAGNOSIS — F319 Bipolar disorder, unspecified: Secondary | ICD-10-CM | POA: Insufficient documentation

## 2016-02-13 DIAGNOSIS — I639 Cerebral infarction, unspecified: Secondary | ICD-10-CM | POA: Diagnosis present

## 2016-02-13 DIAGNOSIS — Z87891 Personal history of nicotine dependence: Secondary | ICD-10-CM | POA: Insufficient documentation

## 2016-02-13 DIAGNOSIS — E781 Pure hyperglyceridemia: Secondary | ICD-10-CM

## 2016-02-13 DIAGNOSIS — Z8489 Family history of other specified conditions: Secondary | ICD-10-CM | POA: Insufficient documentation

## 2016-02-13 DIAGNOSIS — J449 Chronic obstructive pulmonary disease, unspecified: Secondary | ICD-10-CM | POA: Insufficient documentation

## 2016-02-13 DIAGNOSIS — I10 Essential (primary) hypertension: Secondary | ICD-10-CM

## 2016-02-13 DIAGNOSIS — Z836 Family history of other diseases of the respiratory system: Secondary | ICD-10-CM | POA: Insufficient documentation

## 2016-02-13 DIAGNOSIS — R42 Dizziness and giddiness: Secondary | ICD-10-CM | POA: Insufficient documentation

## 2016-02-13 DIAGNOSIS — M419 Scoliosis, unspecified: Secondary | ICD-10-CM | POA: Diagnosis not present

## 2016-02-13 DIAGNOSIS — J84112 Idiopathic pulmonary fibrosis: Secondary | ICD-10-CM | POA: Insufficient documentation

## 2016-02-13 DIAGNOSIS — Z79899 Other long term (current) drug therapy: Secondary | ICD-10-CM | POA: Insufficient documentation

## 2016-02-13 DIAGNOSIS — R479 Unspecified speech disturbances: Secondary | ICD-10-CM | POA: Insufficient documentation

## 2016-02-13 DIAGNOSIS — G43409 Hemiplegic migraine, not intractable, without status migrainosus: Secondary | ICD-10-CM | POA: Diagnosis not present

## 2016-02-13 DIAGNOSIS — G8194 Hemiplegia, unspecified affecting left nondominant side: Secondary | ICD-10-CM

## 2016-02-13 DIAGNOSIS — R262 Difficulty in walking, not elsewhere classified: Secondary | ICD-10-CM

## 2016-02-13 DIAGNOSIS — H538 Other visual disturbances: Secondary | ICD-10-CM | POA: Diagnosis not present

## 2016-02-13 DIAGNOSIS — E785 Hyperlipidemia, unspecified: Secondary | ICD-10-CM | POA: Diagnosis not present

## 2016-02-13 DIAGNOSIS — R7303 Prediabetes: Secondary | ICD-10-CM

## 2016-02-13 DIAGNOSIS — Z7982 Long term (current) use of aspirin: Secondary | ICD-10-CM | POA: Insufficient documentation

## 2016-02-13 DIAGNOSIS — G4730 Sleep apnea, unspecified: Secondary | ICD-10-CM | POA: Diagnosis not present

## 2016-02-13 DIAGNOSIS — K219 Gastro-esophageal reflux disease without esophagitis: Secondary | ICD-10-CM | POA: Diagnosis not present

## 2016-02-13 LAB — LIPID PANEL
Cholesterol: 188 mg/dL (ref 0–200)
HDL: 34 mg/dL — ABNORMAL LOW (ref >40)
LDL CALC: 99 mg/dL (ref 0–99)
TRIGLYCERIDES: 276 mg/dL — AB (ref <150)
Total CHOL/HDL Ratio: 5.5 RATIO
VLDL: 55 mg/dL — ABNORMAL HIGH (ref 0–40)

## 2016-02-13 LAB — DIFFERENTIAL
Basophils Absolute: 0 10*3/uL (ref 0–0.1)
Basophils Relative: 1 %
EOS ABS: 0.2 10*3/uL (ref 0–0.7)
EOS PCT: 2 %
LYMPHS ABS: 2.5 10*3/uL (ref 1.0–3.6)
LYMPHS PCT: 34 %
MONO ABS: 0.5 10*3/uL (ref 0.2–1.0)
MONOS PCT: 7 %
Neutro Abs: 4.1 10*3/uL (ref 1.4–6.5)
Neutrophils Relative %: 56 %

## 2016-02-13 LAB — COMPREHENSIVE METABOLIC PANEL
ALBUMIN: 4.1 g/dL (ref 3.5–5.0)
ALK PHOS: 85 U/L (ref 38–126)
ALT: 38 U/L (ref 17–63)
ANION GAP: 7 (ref 5–15)
AST: 33 U/L (ref 15–41)
BILIRUBIN TOTAL: 0.4 mg/dL (ref 0.3–1.2)
BUN: 9 mg/dL (ref 6–20)
CALCIUM: 9 mg/dL (ref 8.9–10.3)
CO2: 25 mmol/L (ref 22–32)
Chloride: 105 mmol/L (ref 101–111)
Creatinine, Ser: 0.67 mg/dL (ref 0.61–1.24)
GLUCOSE: 145 mg/dL — AB (ref 65–99)
Potassium: 3.8 mmol/L (ref 3.5–5.1)
Sodium: 137 mmol/L (ref 135–145)
TOTAL PROTEIN: 7.6 g/dL (ref 6.5–8.1)

## 2016-02-13 LAB — PROTIME-INR
INR: 0.93
Prothrombin Time: 12.5 seconds (ref 11.4–15.2)

## 2016-02-13 LAB — TSH: TSH: 1.527 u[IU]/mL (ref 0.350–4.500)

## 2016-02-13 LAB — CBC
HEMATOCRIT: 40.9 % (ref 40.0–52.0)
HEMOGLOBIN: 14.4 g/dL (ref 13.0–18.0)
MCH: 31.5 pg (ref 26.0–34.0)
MCHC: 35.2 g/dL (ref 32.0–36.0)
MCV: 89.6 fL (ref 80.0–100.0)
Platelets: 194 10*3/uL (ref 150–440)
RBC: 4.56 MIL/uL (ref 4.40–5.90)
RDW: 13.1 % (ref 11.5–14.5)
WBC: 7.4 10*3/uL (ref 3.8–10.6)

## 2016-02-13 LAB — TROPONIN I

## 2016-02-13 LAB — APTT: aPTT: 26 seconds (ref 24–36)

## 2016-02-13 IMAGING — DX DG CHEST 2V
2 series · 2 of 2 positions shown · non-contrast
Comparison: 03/17/2015

CLINICAL DATA: Cough, congestion and shortness of breath for 1
month, history COPD, asthma, interstitial lung disease, former
smoker, sleep apnea

EXAM:
CHEST  2 VIEW

[chest pa]
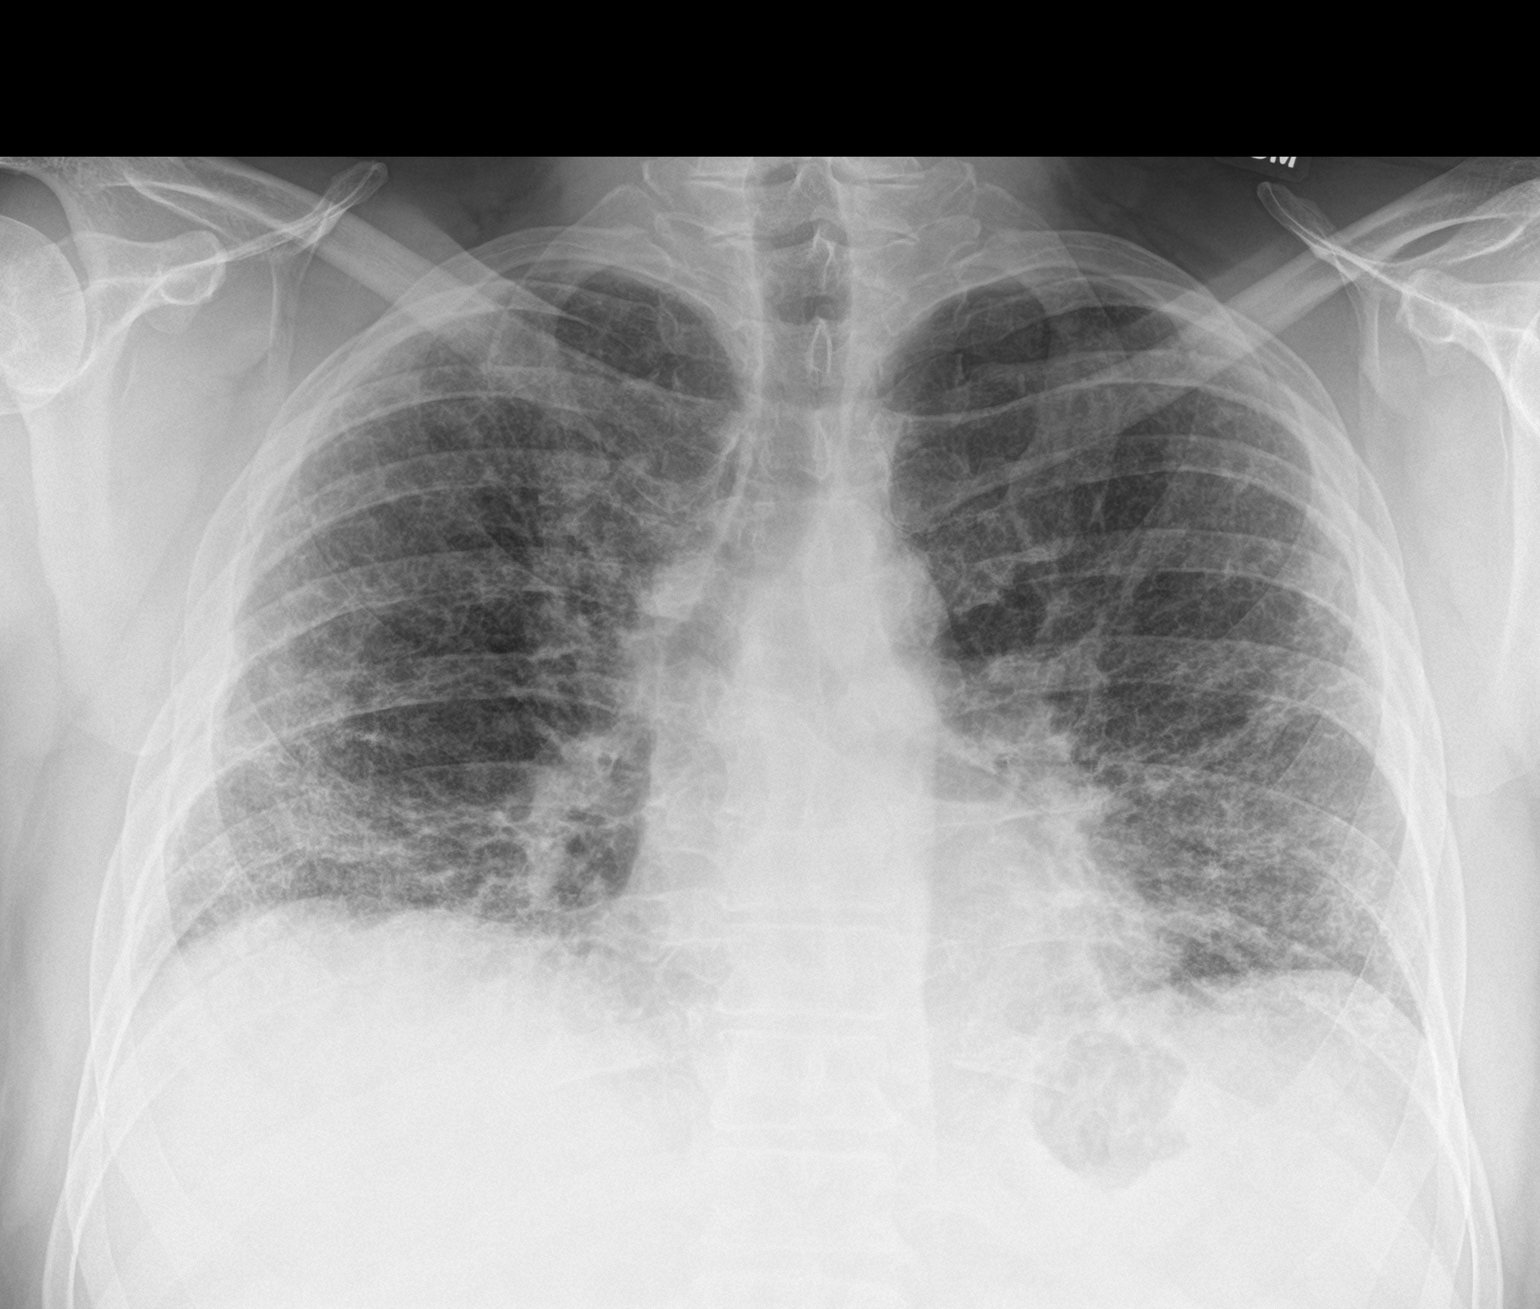

[chest lat]
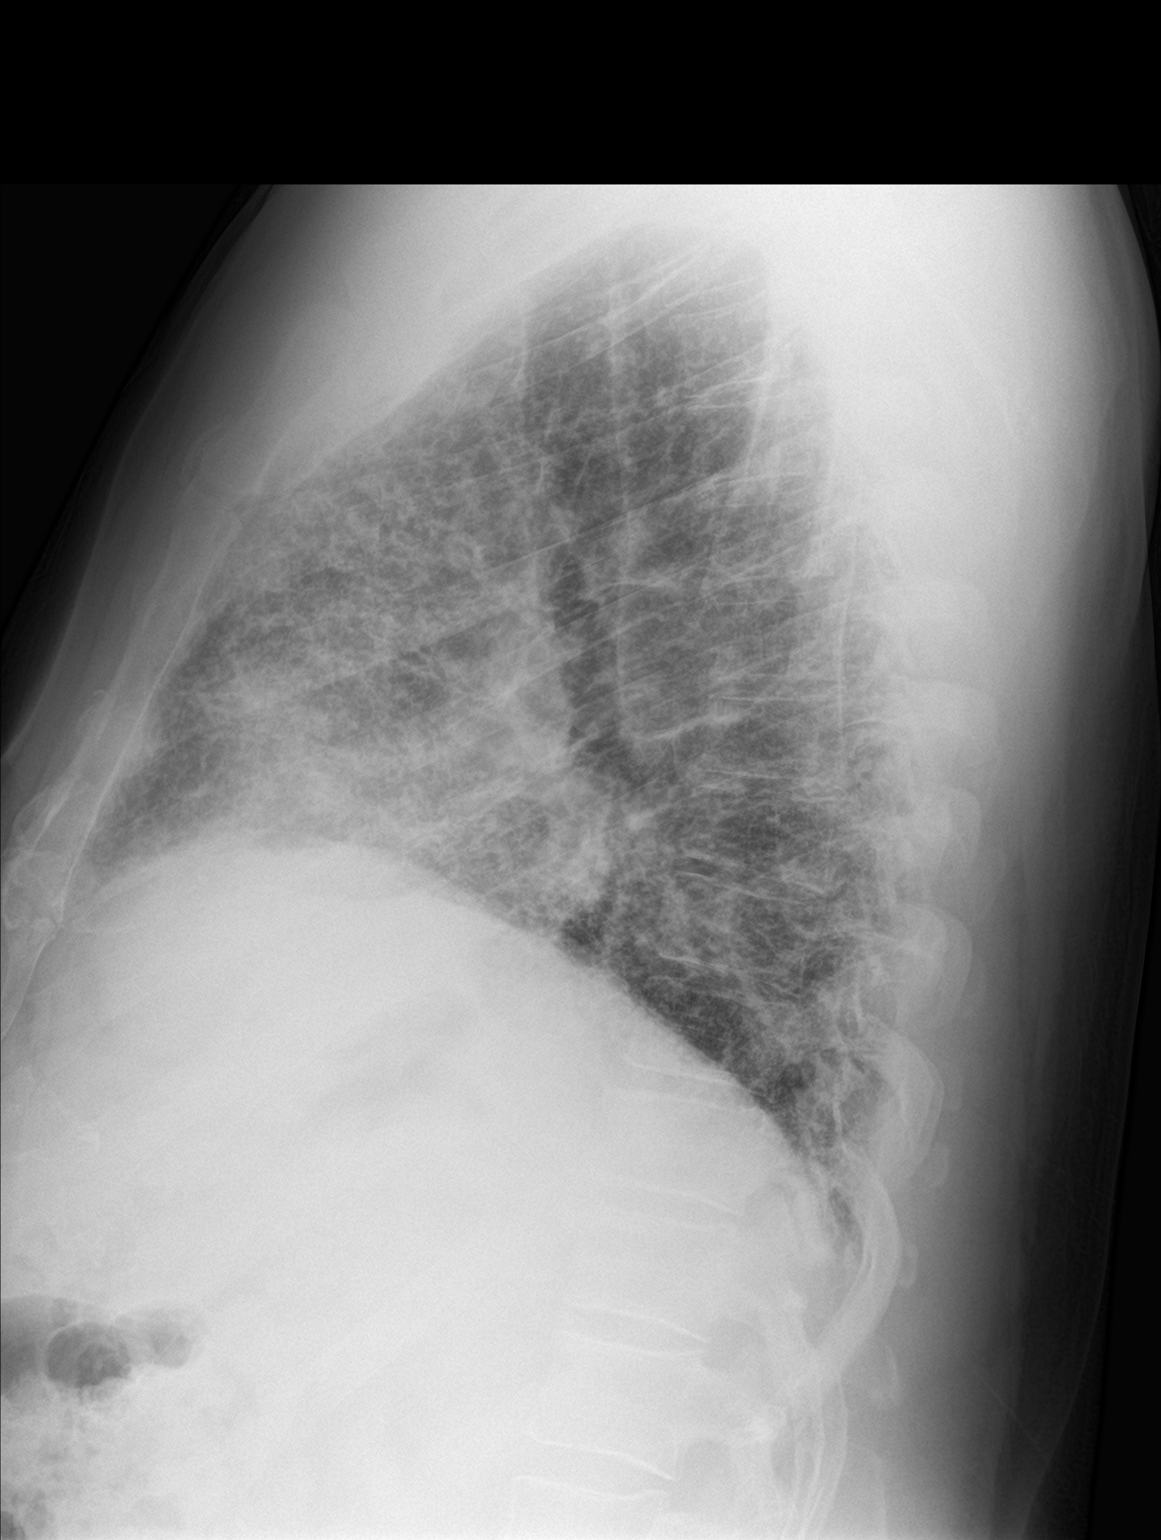

[2 of 2 positions shown; findings below may reference images not displayed]

FINDINGS: Normal heart size and mediastinal contours.

Severe diffuse BILATERAL chronic interstitial lung disease changes
with a peripheral and basilar predominance.

No definite acute infiltrate, pleural effusion or pneumothorax.

Bones unremarkable.
IMPRESSION: Chronic interstitial lung disease with a peripheral and basilar
predominance.

No definite superimposed acute abnormalities.

## 2016-02-13 MED ORDER — ACETAMINOPHEN 650 MG RE SUPP: 650.0000 mg | Freq: Four times a day (QID) | RECTAL | Status: DC | PRN

## 2016-02-13 MED ORDER — ATORVASTATIN CALCIUM 20 MG PO TABS
40.0000 mg | ORAL_TABLET | Freq: Every day | ORAL | Status: DC
  Administered 2016-02-14: 19:00:00 40 mg via ORAL
  Filled 2016-02-13: qty 2

## 2016-02-13 MED ORDER — FERROUS GLUCONATE 324 (38 FE) MG PO TABS
324.0000 mg | ORAL_TABLET | ORAL | Status: DC
  Administered 2016-02-15: 324 mg via ORAL
  Filled 2016-02-13 (×2): qty 1

## 2016-02-13 MED ORDER — DOCUSATE SODIUM 100 MG PO CAPS
100.0000 mg | ORAL_CAPSULE | Freq: Every day | ORAL | Status: DC
  Administered 2016-02-14: 100 mg via ORAL
  Filled 2016-02-13: qty 1

## 2016-02-13 MED ORDER — ASPIRIN 81 MG PO CHEW
324.0000 mg | CHEWABLE_TABLET | Freq: Once | ORAL | Status: AC
  Administered 2016-02-13: 324 mg via ORAL
  Filled 2016-02-13: qty 4

## 2016-02-13 MED ORDER — ONDANSETRON HCL 4 MG PO TABS: 4.0000 mg | ORAL_TABLET | Freq: Four times a day (QID) | ORAL | Status: DC | PRN

## 2016-02-13 MED ORDER — LAMOTRIGINE 100 MG PO TABS
200.0000 mg | ORAL_TABLET | Freq: Two times a day (BID) | ORAL | Status: DC
  Administered 2016-02-13 – 2016-02-15 (×4): 200 mg via ORAL
  Filled 2016-02-13 (×4): qty 2

## 2016-02-13 MED ORDER — FLUTICASONE PROPIONATE 50 MCG/ACT NA SUSP
2.0000 | Freq: Every day | NASAL | Status: DC
  Administered 2016-02-14 – 2016-02-15 (×2): 2 via NASAL
  Filled 2016-02-13: qty 16

## 2016-02-13 MED ORDER — ENOXAPARIN SODIUM 40 MG/0.4ML ~~LOC~~ SOLN
40.0000 mg | SUBCUTANEOUS | Status: DC
  Administered 2016-02-13 – 2016-02-14 (×2): 40 mg via SUBCUTANEOUS
  Filled 2016-02-13 (×2): qty 0.4

## 2016-02-13 MED ORDER — RISAQUAD PO CAPS
ORAL_CAPSULE | Freq: Every day | ORAL | Status: DC
  Administered 2016-02-14: 12:00:00 2 via ORAL
  Administered 2016-02-15: 1 via ORAL
  Filled 2016-02-13 (×2): qty 1

## 2016-02-13 MED ORDER — PIRFENIDONE 267 MG PO CAPS
2.0000 | ORAL_CAPSULE | Freq: Three times a day (TID) | ORAL | Status: DC
  Administered 2016-02-14 – 2016-02-15 (×4): 2 via ORAL
  Filled 2016-02-13 (×4): qty 2

## 2016-02-13 MED ORDER — CARBAMAZEPINE ER 200 MG PO TB12
200.0000 mg | ORAL_TABLET | Freq: Two times a day (BID) | ORAL | Status: DC
  Administered 2016-02-13 – 2016-02-15 (×4): 200 mg via ORAL
  Filled 2016-02-13: qty 1
  Filled 2016-02-13: qty 2
  Filled 2016-02-13 (×3): qty 1

## 2016-02-13 MED ORDER — TRAMADOL HCL 50 MG PO TABS
50.0000 mg | ORAL_TABLET | Freq: Four times a day (QID) | ORAL | Status: DC | PRN
  Administered 2016-02-13: 50 mg via ORAL
  Filled 2016-02-13: qty 1

## 2016-02-13 MED ORDER — ONDANSETRON HCL 4 MG/2ML IJ SOLN: 4.0000 mg | Freq: Four times a day (QID) | INTRAMUSCULAR | Status: DC | PRN

## 2016-02-13 MED ORDER — PANTOPRAZOLE SODIUM 40 MG PO TBEC
40.0000 mg | DELAYED_RELEASE_TABLET | Freq: Two times a day (BID) | ORAL | Status: DC
  Administered 2016-02-13 – 2016-02-15 (×4): 40 mg via ORAL
  Filled 2016-02-13 (×4): qty 1

## 2016-02-13 MED ORDER — ACETAMINOPHEN 325 MG PO TABS
650.0000 mg | ORAL_TABLET | Freq: Four times a day (QID) | ORAL | Status: DC | PRN
  Administered 2016-02-14: 650 mg via ORAL
  Filled 2016-02-13: qty 2

## 2016-02-13 MED ORDER — ASPIRIN EC 325 MG PO TBEC
325.0000 mg | DELAYED_RELEASE_TABLET | Freq: Every day | ORAL | Status: DC
  Administered 2016-02-14 – 2016-02-15 (×2): 325 mg via ORAL
  Filled 2016-02-13 (×2): qty 1

## 2016-02-13 MED ORDER — VITAMIN D 1000 UNITS PO TABS
5000.0000 [IU] | ORAL_TABLET | Freq: Every day | ORAL | Status: DC
  Administered 2016-02-14 – 2016-02-15 (×2): 5000 [IU] via ORAL
  Filled 2016-02-13 (×2): qty 5

## 2016-02-13 MED ORDER — SODIUM CHLORIDE 0.9 % IV SOLN
INTRAVENOUS | Status: AC
  Administered 2016-02-13 – 2016-02-14 (×2): via INTRAVENOUS

## 2016-02-13 MED ORDER — PANTOPRAZOLE SODIUM 40 MG PO TBEC: 40.0000 mg | DELAYED_RELEASE_TABLET | Freq: Two times a day (BID) | ORAL | Status: DC

## 2016-02-13 MED ORDER — SODIUM CHLORIDE 0.9% FLUSH
3.0000 mL | Freq: Two times a day (BID) | INTRAVENOUS | Status: DC
  Administered 2016-02-14 – 2016-02-15 (×3): 3 mL via INTRAVENOUS

## 2016-02-13 NOTE — ED Notes (Addendum)
Pt alert and texting on cell phone.    Pt continues to stutter.

## 2016-02-13 NOTE — ED Notes (Signed)
SOC    CALLED 

## 2016-02-13 NOTE — ED Notes (Signed)
Pt reports h/a dizziness and htn last night at 2100.  Pt took  1 ntg last night and went to bed.  Felt better upon awakening this am.  Today at 1500 sx reoccurred.  Pt reports h/a dizziness, and stuttering today.  Pt has stuttering now and double vision.  Hx of left side paralysis 14 months ago and pt [facility].  Pt recovered  From paralysis.   md at bedside.

## 2016-02-13 NOTE — ED Triage Notes (Signed)
Pt c/o dizziness last night and being off balance. Wife reports she took his blood pressure 138/93 last night and so she gave him one of her blood pressure pills and he felt better.  All sx resolved last night and then at 1500 started with headache, dizziness, stuttering, slurred speech.

## 2016-02-13 NOTE — ED Notes (Signed)
Report called to floor nurse chris rn

## 2016-02-13 NOTE — ED Notes (Signed)
To CT scan 1610.

## 2016-02-13 NOTE — ED Notes (Signed)
CODE STROKE CALLED TO [id number] 

## 2016-02-13 NOTE — H&P (Signed)
[employer] [facility] [place] [facility]   PATIENT NAME: Kevin MediateRobert Cubillos    MR#:  [id number]  DATE OF BIRTH:  [birth year]  DATE OF ADMISSION:  02/13/2016  PRIMARY CARE PHYSICIAN: Marisue IvanLINTHAVONG, KANHKA, MD   REQUESTING/REFERRING PHYSICIAN:  Dr.Parick Roinson  CHIEF COMPLAINT:  No chief complaint on file.   HISTORY OF PRESENT ILLNESS:  Kevin Boyd  is a 50 y.o. male with a known history of  Back surgery scoliosis, bipolar disorder, borderline DM, interstitial ling disease comes from work secondary to worsening speech. Symptoms started last night with dizziness and ataxia along with headache, speech has been normal at the time. This afternoon around 3PM, his speech got worse- more stuttering, word finding difficulty and worsening left sided weakness. Very vague about the timeline of symptoms. So tPA not given. Also showing only giveaway weakness, some resistance is noted, inconsistent neuro exam. Denies any other associated symptoms. CT head without acute findings Being admitted for CVA/TIA work up. Denies any stress/anxiety issues  PAST MEDICAL HISTORY:   Past Medical History:  Diagnosis Date  . Asthma   . Bipolar 1 disorder (HCC)   . Borderline diabetes mellitus   . C. difficile diarrhea   . COPD (chronic obstructive pulmonary disease) (HCC)   . GERD (gastroesophageal reflux disease)   . Interstitial lung disease (HCC)   . Scoliosis   . Seasonal allergies   . Sleep apnea     PAST SURGICAL HISTORY:   Past Surgical History:  Procedure Laterality Date  . BACK SURGERY     lower lumbar fusion  . LUNG SURGERY     biopsy  . thumb surgery      SOCIAL HISTORY:   Social History  Substance Use Topics  . Smoking status: Former Smoker    Packs/day: 2.00    Years: 30.00    Types: Cigarettes    Quit date: 12/28/2013  . Smokeless tobacco: Former NeurosurgeonUser     Comment: quit 2 years ago  . Alcohol use 0.0 oz/week     Comment: very occasional    FAMILY HISTORY:    Family History  Problem Relation Age of Onset  . Pulmonary fibrosis Mother   . Sjogren's syndrome Mother   . Pulmonary fibrosis Maternal Grandmother     DRUG ALLERGIES:  No Known Allergies  REVIEW OF SYSTEMS:   Review of Systems  Constitutional: Negative for chills, fever, malaise/fatigue and weight loss.  HENT: Negative for ear discharge, ear pain, hearing loss and nosebleeds.   Eyes: Positive for blurred vision. Negative for double vision and photophobia.  Respiratory: Negative for cough, hemoptysis, shortness of breath and wheezing.   Cardiovascular: Negative for chest pain, palpitations, orthopnea and leg swelling.  Gastrointestinal: Positive for nausea. Negative for abdominal pain, constipation, diarrhea, heartburn, melena and vomiting.  Genitourinary: Negative for dysuria, frequency, hematuria and urgency.  Musculoskeletal: Negative for back pain, myalgias and neck pain.  Skin: Negative for rash.  Neurological: Positive for dizziness, sensory change, speech change, focal weakness and headaches. Negative for tingling and tremors.  Endo/Heme/Allergies: Does not bruise/bleed easily.  Psychiatric/Behavioral: Negative for depression.    MEDICATIONS AT HOME:   Prior to Admission medications   Medication Sig Start Date End Date Taking? Authorizing Provider  albuterol (PROVENTIL HFA;VENTOLIN HFA) 108 (90 BASE) MCG/ACT inhaler Inhale 2 puffs into the lungs every 6 (six) hours as needed for wheezing or shortness of breath.   Yes Historical Provider, MD  aspirin EC 81 MG tablet Take 1 tablet (81  mg total) by mouth daily. 11/04/14  Yes Shaune PollackQing Chen, MD  atorvastatin (LIPITOR) 40 MG tablet Take 1 tablet (40 mg total) by mouth daily at 6 PM. 11/04/14  Yes Shaune PollackQing Chen, MD  carbamazepine (TEGRETOL XR) 100 MG 12 hr tablet Take 100-200 mg by mouth 2 (two) times daily. 2 tab qam, 2 tabs qhs   Yes Historical Provider, MD  esomeprazole (NEXIUM) 40 MG capsule Take 1 capsule (40 mg total) by mouth 2  (two) times daily before a meal. 11/10/14  Yes Lupita Leashouglas B McQuaid, MD  ferrous gluconate (FERGON) 324 MG tablet Take 324 mg by mouth every other day. At night   Yes Historical Provider, MD  lamoTRIgine (LAMICTAL) 200 MG tablet Take 200 mg by mouth 2 (two) times daily.   Yes Historical Provider, MD  Pirfenidone 267 MG CAPS Take 2 capsules by mouth 3 (three) times daily. 05/16/15  Yes Lupita Leashouglas B McQuaid, MD  Respiratory Therapy Supplies (FLUTTER) DEVI Use as directed 03/17/15  Yes Lupita Leashouglas B McQuaid, MD  Bioflavonoid Products (ESTER C PO) Take 1 tablet by mouth daily.    Historical Provider, MD  Cholecalciferol (VITAMIN D-3) 5000 UNITS TABS Take 1 tablet by mouth daily.    Historical Provider, MD  docusate sodium (COLACE) 100 MG capsule Take 100 mg by mouth at bedtime.    Historical Provider, MD  esomeprazole (NEXIUM) 40 MG capsule TAKE 1 BY MOUTH TWICE DAILY BEFORE MEALS Patient not taking: Reported on 02/13/2016 11/07/15   Lupita Leashouglas B McQuaid, MD  fluticasone (FLONASE) 50 MCG/ACT nasal spray Place 2 sprays into both nostrils daily.    Historical Provider, MD  HYDROcodone-acetaminophen (NORCO/VICODIN) 5-325 MG tablet Take 1 tablet by mouth every 4 (four) hours as needed for moderate pain. Patient not taking: Reported on 02/13/2016 01/17/16   Tommi Rumpshonda L Summers, PA-C  Probiotic Product (PROBIOTIC DAILY PO) Take 1 tablet by mouth daily.    Historical Provider, MD      VITAL SIGNS:  Blood pressure 130/75, pulse 80, temperature 98.1 F (36.7 C), resp. rate (!) 23, height 6\' 2"  (1.88 m), weight 135.2 kg (298 lb), SpO2 94 %.  PHYSICAL EXAMINATION:   Physical Exam  GENERAL:  50 y.o.-year-old patient sitting in the bed with no acute distress.  EYES: Pupils equal, round, reactive to light and accommodation. No scleral icterus. Extraocular muscles intact.  HEENT: Head atraumatic, normocephalic. Oropharynx and nasopharynx clear.  NECK:  Supple, no jugular venous distention. No thyroid enlargement, no  tenderness.  LUNGS: Normal breath sounds bilaterally, no wheezing, rales,rhonchi or crepitation. No use of accessory muscles of respiration.  CARDIOVASCULAR: S1, S2 normal. No murmurs, rubs, or gallops.  ABDOMEN: Soft, nontender, nondistended. Bowel sounds present. No organomegaly or mass.  EXTREMITIES: No pedal edema, cyanosis, or clubbing.  NEUROLOGIC: Cranial nerves II through XII are intact. No facial droop- stuttered speech, occasional word finding difficulty, pressured speech noted. Muscle strength 5/5 in boh RUE and RLE extremities. On the left, patient feels his sensation to light touch has decreased, and LUE is 3/5 and LLE is 3/5 as well, but good proximal muscle strength pushing limbs down against resistance. Gait not checked.  PSYCHIATRIC: The patient is alert and oriented x 3.  SKIN: No obvious rash, lesion, or ulcer.   LABORATORY PANEL:   CBC  Recent Labs Lab 02/13/16 1619  WBC 7.4  HGB 14.4  HCT 40.9  PLT 194   ------------------------------------------------------------------------------------------------------------------  Chemistries   Recent Labs Lab 02/13/16 1619  NA 137  K 3.8  CL 105  CO2 25  GLUCOSE 145*  BUN 9  CREATININE 0.67  CALCIUM 9.0  AST 33  ALT 38  ALKPHOS 85  BILITOT 0.4   ------------------------------------------------------------------------------------------------------------------  Cardiac Enzymes  Recent Labs Lab 02/13/16 1619  TROPONINI <0.03   ------------------------------------------------------------------------------------------------------------------  RADIOLOGY:  Ct Head Code Stroke W/o Cm  Result Date: 02/13/2016 CLINICAL DATA:  Code stroke. Acute onset of dizziness, slurred speech, and headache. EXAM: CT HEAD WITHOUT CONTRAST TECHNIQUE: Contiguous axial images were obtained from the base of the skull through the vertex without intravenous contrast. COMPARISON:  06/19/2015 FINDINGS: Brain: There is no evidence of  acute cortical infarct, intracranial hemorrhage, mass, midline shift, or extra-axial fluid collection. The ventricles and sulci are normal. Mild enlargement of the cisterna magna is again noted, a normal variant. Vascular: No hyperdense vessel. Minimal carotid siphon calcified plaque. Skull: No fracture or focal osseous lesion. Sinuses/Orbits: Visualized paranasal sinuses and mastoid air cells are clear. Orbits are unremarkable. Other: None. ASPECTS [facility] Stroke Program Early CT Score) - Ganglionic level infarction (caudate, lentiform nuclei, internal capsule, insula, M1-M3 cortex): 7 - Supraganglionic infarction (M4-M6 cortex): 3 Total score (0-10 with 10 being normal): 10 IMPRESSION: 1. No evidence of acute intracranial abnormality. 2. ASPECTS is 10. These results were called by telephone at the time of interpretation on 02/13/2016 at 4:23 pm to Dr. Roxan Hockey, who verbally acknowledged these results. Electronically Signed   By: Sebastian Ache M.D.   On: 02/13/2016 16:24    EKG:   Orders placed or performed during the hospital encounter of 02/13/16  . ED EKG  . ED EKG  . EKG 12-Lead  . EKG 12-Lead    IMPRESSION AND PLAN:   Dayquan Buys  is a 50 y.o. male with a known history of  Back surgery scoliosis, bipolar disorder, borderline DM, interstitial ling disease comes from work secondary to worsening speech.  #1 TIA- with speech changes and left sided weakness, tPA not given as symptoms inconsistent and vague timeline. - Admit, MRI/MRA brain, carotid dopplers and ECHO - on asa- statin- continue, lipid panel - PT, OT and speech consults ordered - Neuro checks  #2 Bipolar- tegretol and lamictal  #3 DM- check a1c, borderline DM  #4 GERD- bid PPI  #5 DVT Prophylaxis- lovenox   PT and OT consults.   All the records are reviewed and case discussed with ED provider. Management plans discussed with the patient, family and they are in agreement.  CODE STATUS: Full Code  TOTAL TIME  TAKING CARE OF THIS PATIENT: 50 minutes.    Enid Baas M.D on 02/13/2016 at 5:50 PM  Between 7am to 6pm - Pager - [phone]  After 6pm go to [email or web address] - Social research officer, government  [facility] [place] [facility]  Office  [phone]  CC: Primary care physician; Marisue Ivan, MD

## 2016-02-13 NOTE — ED Provider Notes (Signed)
[facility] Emergency Department Provider Note    First MD Initiated Contact with Patient 02/13/16 1617     (approximate)  I have reviewed the triage vital signs and the nursing notes.   HISTORY  Chief Complaint No chief complaint on file.    HPI Kevin Boyd is a 50 y.o. male with history of bipolar disorder as well as diabetes and asthma. Patient presents with reported dizziness last night and feeling off balance. Symptoms otherwise resolved after roughly 3045 minutes. And then roughly around 3 PM today the patient had headache dizziness, stuttering and slurred speech with reported left-sided weakness. The patient ambulated into the ER. Patient was called a code stroke from triage. Per report of the patient's girlfriend the patient develops stuttering and slurred speech when he feels stressed out. He denies any personal history of abnormal rhythms. Denies any numbness or tingling right now.   Past Medical History:  Diagnosis Date  . Asthma   . Bipolar 1 disorder (HCC)   . Borderline diabetes mellitus   . C. difficile diarrhea   . COPD (chronic obstructive pulmonary disease) (HCC)   . GERD (gastroesophageal reflux disease)   . Interstitial lung disease (HCC)   . Scoliosis   . Seasonal allergies   . Sleep apnea    Family History  Problem Relation Age of Onset  . Pulmonary fibrosis Mother   . Sjogren's syndrome Mother   . Pulmonary fibrosis Maternal Grandmother    Past Surgical History:  Procedure Laterality Date  . BACK SURGERY     lower lumbar fusion  . LUNG SURGERY     biopsy  . thumb surgery     Patient Active Problem List   Diagnosis Date Noted  . Cough 04/25/2015  . CAP (community acquired pneumonia) 03/17/2015  . Migraine 11/05/2014  . Hyperlipidemia 11/04/2014  . Migraine headache 11/03/2014  . Borderline diabetes mellitus 11/03/2014  . Bipolar 1 disorder (HCC) 11/03/2014  . Fatigue 10/05/2014  . GERD (gastroesophageal reflux  disease) 06/22/2014  . Chest pain 04/06/2014  . Shortness of breath 03/04/2014  . Solitary pulmonary nodule 03/04/2014  . IPF (idiopathic pulmonary fibrosis) (HCC) 02/17/2014      Prior to Admission medications   Medication Sig Start Date End Date Taking? Authorizing Provider  albuterol (PROVENTIL HFA;VENTOLIN HFA) 108 (90 BASE) MCG/ACT inhaler Inhale 2 puffs into the lungs every 6 (six) hours as needed for wheezing or shortness of breath.    Historical Provider, MD  aspirin EC 81 MG tablet Take 1 tablet (81 mg total) by mouth daily. 11/04/14   Shaune PollackQing Chen, MD  atorvastatin (LIPITOR) 40 MG tablet Take 1 tablet (40 mg total) by mouth daily at 6 PM. 11/04/14   Shaune PollackQing Chen, MD  Bioflavonoid Products (ESTER C PO) Take 1 tablet by mouth daily.    Historical Provider, MD  carbamazepine (TEGRETOL XR) 100 MG 12 hr tablet Take 100-200 mg by mouth 2 (two) times daily. 1 tab qam, 2 tabs qhs    Historical Provider, MD  Cholecalciferol (VITAMIN D-3) 5000 UNITS TABS Take 1 tablet by mouth daily.    Historical Provider, MD  docusate sodium (COLACE) 100 MG capsule Take 100 mg by mouth at bedtime.    Historical Provider, MD  esomeprazole (NEXIUM) 40 MG capsule Take 1 capsule (40 mg total) by mouth 2 (two) times daily before a meal. 11/10/14   Lupita Leashouglas B McQuaid, MD  esomeprazole (NEXIUM) 40 MG capsule TAKE 1 BY MOUTH TWICE DAILY BEFORE MEALS  11/07/15   [name], MD  ferrous gluconate (FERGON) 324 MG tablet Take 324 mg by mouth every other day. At night    Historical Provider, MD  fluticasone (FLONASE) 50 MCG/ACT nasal spray Place 2 sprays into both nostrils daily.    Historical Provider, MD  HYDROcodone-acetaminophen (NORCO/VICODIN) 5-325 MG tablet Take 1 tablet by mouth every 4 (four) hours as needed for moderate pain. 01/17/16   Tommi Rumps, PA-C  lamoTRIgine (LAMICTAL) 200 MG tablet Take 200 mg by mouth 2 (two) times daily.    Historical Provider, MD  Pirfenidone 267 MG CAPS Take 2 capsules by mouth 3  (three) times daily. 05/16/15   [name], MD  Probiotic Product (PROBIOTIC DAILY PO) Take 1 tablet by mouth daily.    Historical Provider, MD  Respiratory Therapy Supplies (FLUTTER) DEVI Use as directed 03/17/15   [name], MD    Allergies Patient has no known allergies.    Social History Social History  Substance Use Topics  . Smoking status: Former Smoker    Packs/day: 2.00    Years: 30.00    Types: Cigarettes    Quit date: 12/28/2013  . Smokeless tobacco: Former Neurosurgeon  . Alcohol use Not on file    Review of Systems Patient denies headaches, rhinorrhea, blurry vision, numbness, shortness of breath, chest pain, edema, cough, abdominal pain, nausea, vomiting, diarrhea, dysuria, fevers, rashes or hallucinations unless otherwise stated above in HPI. ____________________________________________   PHYSICAL EXAM:  VITAL SIGNS: Vitals:   02/13/16 1634 02/13/16 1656  BP:    Pulse: 80   Resp: 14   Temp:  98.1 F (36.7 C)    Constitutional: Alert and oriented. Eyes: Conjunctivae are normal. PERRL. EOMI. Head: Atraumatic. Nose: No congestion/rhinnorhea. Mouth/Throat: Mucous membranes are moist.  Oropharynx non-erythematous. Neck: No stridor. Painless ROM. No cervical spine tenderness to palpation Hematological/Lymphatic/Immunilogical: No cervical lymphadenopathy. Cardiovascular: Normal rate, regular rhythm. Grossly normal heart sounds.  Good peripheral circulation. Respiratory: Normal respiratory effort.  No retractions. Lungs CTAB. Gastrointestinal: Soft and nontender. No distention. No abdominal bruits. No CVA tenderness. Musculoskeletal: No lower extremity tenderness nor edema.  No joint effusions. Neurologic:  No facial droop. When asked to hold her hand up against gravity patient slowly and allows his left upper extremity to drift to the left. Then when asked to come grab fingers for grip testing the patient brings arm up to hands. He has no effort against  gravity to the left leg but does have good tone. He has no facial droop. He has intermittent stuttering but no true dysarthria. He is alert and oriented does not appear to have any aphasia. Reflexes are normal. Skin:  Skin is warm, dry and intact. No rash noted.  ____________________________________________   LABS (all labs ordered are listed, but only abnormal results are displayed)  Results for orders placed or performed during the hospital encounter of 02/13/16 (from the past 24 hour(s))  Protime-INR     Status: None   Collection Time: 02/13/16  4:19 PM  Result Value Ref Range   Prothrombin Time 12.5 11.4 - 15.2 seconds   INR 0.93   APTT     Status: None   Collection Time: 02/13/16  4:19 PM  Result Value Ref Range   aPTT 26 24 - 36 seconds  CBC     Status: None   Collection Time: 02/13/16  4:19 PM  Result Value Ref Range   WBC 7.4 3.8 - 10.6 K/uL   RBC 4.56  4.40 - 5.90 MIL/uL   Hemoglobin 14.4 13.0 - 18.0 g/dL   HCT 60.4 54.0 - 98.1 %   MCV 89.6 80.0 - 100.0 fL   MCH 31.5 26.0 - 34.0 pg   MCHC 35.2 32.0 - 36.0 g/dL   RDW 19.1 47.8 - 29.5 %   Platelets 194 150 - 440 K/uL  Differential     Status: None   Collection Time: 02/13/16  4:19 PM  Result Value Ref Range   Neutrophils Relative % 56 %   Neutro Abs 4.1 1.4 - 6.5 K/uL   Lymphocytes Relative 34 %   Lymphs Abs 2.5 1.0 - 3.6 K/uL   Monocytes Relative 7 %   Monocytes Absolute 0.5 0.2 - 1.0 K/uL   Eosinophils Relative 2 %   Eosinophils Absolute 0.2 0 - 0.7 K/uL   Basophils Relative 1 %   Basophils Absolute 0.0 0 - 0.1 K/uL  Comprehensive metabolic panel     Status: Abnormal   Collection Time: 02/13/16  4:19 PM  Result Value Ref Range   Sodium 137 135 - 145 mmol/L   Potassium 3.8 3.5 - 5.1 mmol/L   Chloride 105 101 - 111 mmol/L   CO2 25 22 - 32 mmol/L   Glucose, Bld 145 (H) 65 - 99 mg/dL   BUN 9 6 - 20 mg/dL   Creatinine, Ser 6.21 0.61 - 1.24 mg/dL   Calcium 9.0 8.9 - 30.8 mg/dL   Total Protein 7.6 6.5 - 8.1 g/dL     Albumin 4.1 3.5 - 5.0 g/dL   AST 33 15 - 41 U/L   ALT 38 17 - 63 U/L   Alkaline Phosphatase 85 38 - 126 U/L   Total Bilirubin 0.4 0.3 - 1.2 mg/dL   GFR calc non Af Amer >60 >60 mL/min   GFR calc Af Amer >60 >60 mL/min   Anion gap 7 5 - 15  Troponin I     Status: None   Collection Time: 02/13/16  4:19 PM  Result Value Ref Range   Troponin I <0.03 <0.03 ng/mL   ____________________________________________  EKG My review and personal interpretation at Time: 16:23   Indication: cva  Rate: 75  Rhythm: sinus Axis: normal Other: no acute ischemic changes ____________________________________________  RADIOLOGY  I personally reviewed all radiographic images ordered to evaluate for the above acute complaints and reviewed radiology reports and findings.  These findings were personally discussed with the patient.  Please see medical record for radiology report. ____________________________________________   PROCEDURES  Procedure(s) performed: none Procedures    Critical Care performed: no ____________________________________________   INITIAL IMPRESSION / ASSESSMENT AND PLAN / ED COURSE  Pertinent labs & imaging results that were available during my care of the patient were reviewed by me and considered in my medical decision making (see chart for details).  DDX: cva, conversion, sah, iph, migraine, dysrhythmia  Kevin Boyd is a 50 y.o. who presents to the ED with above complaints  Patient is AFVSS in ED. Exam as above. Given current presentation have considered the above differential.  Patient well-appearing but exam as above. CT head emergently indicated and shows no evidence of acute bleed or significant infarct. Do not feel patient would meet inclusion criteria for TPA as he is having stuttering symptoms over the past 24 hours. Neurology was consult the from triage to the acute complaints. Very bizarre presentation and the patient with underlying psychiatric history that he  does have risk factors for CVA therefore do feel the  patient requires further investigation at this time.  The patient will be placed on continuous pulse oximetry and telemetry for monitoring.  Laboratory evaluation will be sent to evaluate for the above complaints.      Clinical Course    ----------------------------------------- 4:59 PM on 02/13/2016 -----------------------------------------  [facility]  Patient does not meet criteria for TPA but will need admission for further evaluation and management. Have paged hospitalist for admission.  ____________________________________________   FINAL CLINICAL IMPRESSION(S) / ED DIAGNOSES  Final diagnoses:  Cerebrovascular accident (CVA), unspecified mechanism (HCC)      NEW MEDICATIONS STARTED DURING THIS VISIT:  New Prescriptions   No medications on file     Note:  This document was prepared using Dragon voice recognition software and may include unintentional dictation errors.    Willy EddyPatrick Sabel Hornbeck, MD 02/13/16 [phone]

## 2016-02-13 NOTE — ED Notes (Signed)
SOC on computer now with pt and family.

## 2016-02-14 ENCOUNTER — Observation Stay: Payer: BLUE CROSS/BLUE SHIELD

## 2016-02-14 ENCOUNTER — Observation Stay (HOSPITAL_BASED_OUTPATIENT_CLINIC_OR_DEPARTMENT_OTHER)
Admit: 2016-02-14 | Discharge: 2016-02-14 | Disposition: A | Discharge status: Home / Self Care | Payer: BLUE CROSS/BLUE SHIELD | Attending: Internal Medicine | Admitting: Internal Medicine

## 2016-02-14 DIAGNOSIS — G43401 Hemiplegic migraine, not intractable, with status migrainosus: Secondary | ICD-10-CM | POA: Diagnosis not present

## 2016-02-14 DIAGNOSIS — G8194 Hemiplegia, unspecified affecting left nondominant side: Secondary | ICD-10-CM

## 2016-02-14 DIAGNOSIS — G459 Transient cerebral ischemic attack, unspecified: Secondary | ICD-10-CM | POA: Diagnosis not present

## 2016-02-14 LAB — BASIC METABOLIC PANEL
Anion gap: 7 (ref 5–15)
BUN: 12 mg/dL (ref 6–20)
CHLORIDE: 106 mmol/L (ref 101–111)
CO2: 26 mmol/L (ref 22–32)
CREATININE: 0.74 mg/dL (ref 0.61–1.24)
Calcium: 8.6 mg/dL — ABNORMAL LOW (ref 8.9–10.3)
GFR calc Af Amer: >60 mL/min (ref >60)
GFR calc non Af Amer: >60 mL/min (ref >60)
Glucose, Bld: 113 mg/dL — ABNORMAL HIGH (ref 65–99)
Potassium: 4.2 mmol/L (ref 3.5–5.1)
SODIUM: 139 mmol/L (ref 135–145)

## 2016-02-14 LAB — CBC
HCT: 39.5 % — ABNORMAL LOW (ref 40.0–52.0)
Hemoglobin: 13.6 g/dL (ref 13.0–18.0)
MCH: 31.4 pg (ref 26.0–34.0)
MCHC: 34.4 g/dL (ref 32.0–36.0)
MCV: 91.1 fL (ref 80.0–100.0)
PLATELETS: 175 10*3/uL (ref 150–440)
RBC: 4.34 MIL/uL — ABNORMAL LOW (ref 4.40–5.90)
RDW: 13.2 % (ref 11.5–14.5)
WBC: 6.4 10*3/uL (ref 3.8–10.6)

## 2016-02-14 LAB — HEMOGLOBIN A1C
Hgb A1c MFr Bld: 6.3 % — ABNORMAL HIGH (ref 4.8–5.6)
Mean Plasma Glucose: 134 mg/dL

## 2016-02-14 MED ORDER — IOPAMIDOL (ISOVUE-370) INJECTION 76%
75.0000 mL | Freq: Once | INTRAVENOUS | Status: AC | PRN
  Administered 2016-02-14: 14:00:00 75 mL via INTRAVENOUS

## 2016-02-14 MED ORDER — MAGNESIUM SULFATE IN D5W 1-5 GM/100ML-% IV SOLN
1.0000 g | Freq: Once | INTRAVENOUS | Status: AC
  Administered 2016-02-14: 12:00:00 1 g via INTRAVENOUS
  Filled 2016-02-14: qty 100

## 2016-02-14 MED ORDER — MAGNESIUM SULFATE 50 % IJ SOLN: 1.0000 g | Freq: Once | INTRAMUSCULAR | Status: DC

## 2016-02-14 NOTE — Progress Notes (Signed)
PG to ED. Patient was alert and responsive. Was receiving assessment by attending. Spoke with wife and informed of Chaplain services should they be needed. Dismissed upon completion of exam.

## 2016-02-14 NOTE — Consult Note (Signed)
Reason for Consult:Headache, left sided weakness Referring Physician: Winona LegatoVaickute  CC: Left sided weakness  HPI: Kevin MediateRobert Boyd is an 50 y.o. male with a history of hemiplegic migraine who is followed by Dr. Sherryll BurgerShah on an outpatient basis who reports that on Sunday at 2100 began to be of balance and dizzy.  Blood pressure was mildly elevated.  Patient did not want to come to the hospital and went to bed.  Awakened the next day feeling fine. Went to work and by the afternoon had the onset of dizziness again with headache, left sided weakness and difficulty with speech.  Rates headache at a 8/10.    Past Medical History:  Diagnosis Date  . Asthma   . Bipolar 1 disorder (HCC)   . Borderline diabetes mellitus   . C. difficile diarrhea   . COPD (chronic obstructive pulmonary disease) (HCC)   . GERD (gastroesophageal reflux disease)   . Interstitial lung disease (HCC)   . Scoliosis   . Seasonal allergies   . Sleep apnea     Past Surgical History:  Procedure Laterality Date  . BACK SURGERY     lower lumbar fusion  . LUNG SURGERY     biopsy  . thumb surgery      Family History  Problem Relation Age of Onset  . Pulmonary fibrosis Mother   . Sjogren's syndrome Mother   . Pulmonary fibrosis Maternal Grandmother     Social History:  reports that he quit smoking about 2 years ago. His smoking use included Cigarettes. He has a 60.00 pack-year smoking history. He has quit using smokeless tobacco. He reports that he drinks alcohol. His drug history is not on file.  No Known Allergies  Medications:  I have reviewed the patient's current medications. Prior to Admission:  Prescriptions Prior to Admission  Medication Sig Dispense Refill Last Dose  . albuterol (PROVENTIL HFA;VENTOLIN HFA) 108 (90 BASE) MCG/ACT inhaler Inhale 2 puffs into the lungs every 6 (six) hours as needed for wheezing or shortness of breath.   prn at prn  . aspirin EC 81 MG tablet Take 1 tablet (81 mg total) by mouth daily.  30 tablet 0 02/12/2016 at 2100  . atorvastatin (LIPITOR) 40 MG tablet Take 1 tablet (40 mg total) by mouth daily at 6 PM. 30 tablet 0 02/13/2016 at 0730  . carbamazepine (TEGRETOL XR) 100 MG 12 hr tablet Take 100-200 mg by mouth 2 (two) times daily. 2 tab qam, 2 tabs qhs   02/13/2016 at Unknown time  . esomeprazole (NEXIUM) 40 MG capsule Take 1 capsule (40 mg total) by mouth 2 (two) times daily before a meal. 60 capsule 3 02/13/2016 at 0730  . ferrous gluconate (FERGON) 324 MG tablet Take 324 mg by mouth every other day. At night   02/12/2016 at 2100  . lamoTRIgine (LAMICTAL) 200 MG tablet Take 200 mg by mouth 2 (two) times daily.   02/13/2016 at 0730  . Pirfenidone 267 MG CAPS Take 2 capsules by mouth 3 (three) times daily. 180 capsule 11 02/13/2016 at 0730  . Respiratory Therapy Supplies (FLUTTER) DEVI Use as directed 1 each 0 02/13/2016 at Unknown time  . Bioflavonoid Products (ESTER C PO) Take 1 tablet by mouth daily.   Not Taking at Unknown time  . Cholecalciferol (VITAMIN D-3) 5000 UNITS TABS Take 1 tablet by mouth daily.   Not Taking at Unknown time  . docusate sodium (COLACE) 100 MG capsule Take 100 mg by mouth at bedtime.   Not  Taking at Unknown time  . esomeprazole (NEXIUM) 40 MG capsule TAKE 1 BY MOUTH TWICE DAILY BEFORE MEALS (Patient not taking: Reported on 02/13/2016) 180 capsule 1 Not Taking at Unknown time  . fluticasone (FLONASE) 50 MCG/ACT nasal spray Place 2 sprays into both nostrils daily.   Not Taking at Unknown time  . HYDROcodone-acetaminophen (NORCO/VICODIN) 5-325 MG tablet Take 1 tablet by mouth every 4 (four) hours as needed for moderate pain. (Patient not taking: Reported on 02/13/2016) 20 tablet 0 Not Taking at Unknown time  . Probiotic Product (PROBIOTIC DAILY PO) Take 1 tablet by mouth daily.   Not Taking at Unknown time   Scheduled: . acidophilus   Oral Daily  . aspirin EC  325 mg Oral Daily  . atorvastatin  40 mg Oral q1800  . carbamazepine  200 mg Oral BID  .  cholecalciferol  5,000 Units Oral Daily  . docusate sodium  100 mg Oral QHS  . enoxaparin (LOVENOX) injection  40 mg Subcutaneous Q24H  . ferrous gluconate  324 mg Oral QODAY  . fluticasone  2 spray Each Nare Daily  . lamoTRIgine  200 mg Oral BID  . pantoprazole  40 mg Oral BID  . Pirfenidone  2 capsule Oral TID  . sodium chloride flush  3 mL Intravenous Q12H    ROS: History obtained from the patient  General ROS: negative for - chills, fatigue, fever, night sweats, weight gain or weight loss Psychological ROS: negative for - behavioral disorder, hallucinations, memory difficulties, mood swings or suicidal ideation Ophthalmic ROS: negative for - blurry vision, double vision, eye pain or loss of vision ENT ROS: negative for - epistaxis, nasal discharge, oral lesions, sore throat, tinnitus or vertigo Allergy and Immunology ROS: negative for - hives or itchy/watery eyes Hematological and Lymphatic ROS: negative for - bleeding problems, bruising or swollen lymph nodes Endocrine ROS: negative for - galactorrhea, hair pattern changes, polydipsia/polyuria or temperature intolerance Respiratory ROS: negative for - cough, hemoptysis, shortness of breath or wheezing Cardiovascular ROS: negative for - chest pain, dyspnea on exertion, edema or irregular heartbeat Gastrointestinal ROS: negative for - abdominal pain, diarrhea, hematemesis, nausea/vomiting or stool incontinence Genito-Urinary ROS: negative for - dysuria, hematuria, incontinence or urinary frequency/urgency Musculoskeletal ROS: negative for - joint swelling or muscular weakness Neurological ROS: as noted in HPI Dermatological ROS: negative for rash and skin lesion changes  Physical Examination: Blood pressure 123/77, pulse 62, temperature 98.1 F (36.7 C), temperature source Oral, resp. rate 19, height 6\' 2"  (1.88 m), weight 132.6 kg (292 lb 5 oz), SpO2 94 %.  HEENT-  Normocephalic, no lesions, without obvious abnormality.  Normal  external eye and conjunctiva.  Normal TM's bilaterally.  Normal auditory canals and external ears. Normal external nose, mucus membranes and septum.  Normal pharynx. Cardiovascular- S1, S2 normal, pulses palpable throughout   Lungs- chest clear, no wheezing, rales, normal symmetric air entry Abdomen- soft, non-tender; bowel sounds normal; no masses,  no organomegaly Extremities- LE edema Lymph-no adenopathy palpable Musculoskeletal-no joint tenderness, deformity or swelling Skin-warm and dry, no hyperpigmentation, vitiligo, or suspicious lesions  Neurological Examination Mental Status: Alert, oriented, thought content appropriate.  Speech stuttering but patient able to name objects.  Able to follow 3 step commands without difficulty. Cranial Nerves: II: Discs flat bilaterally; Visual fields grossly normal, pupils equal, round, reactive to light and accommodation III,IV, VI: ptosis not present, extra-ocular motions intact bilaterally V,VII: smile symmetric, facial light touch sensation decreased on the left VIII: hearing normal bilaterally IX,X:  gag reflex present XI: bilateral shoulder shrug XII: midline tongue extension Motor: Patient has difficulty giving full strength on either side when both sides tested together.  Right 5/5.  Left with drift but no pronation.  Gives LLE strength of 2-3/5.   Sensory: Pinprick and light touch decreased on the left Deep Tendon Reflexes: 2+ and symmetric throughout Plantars: Right: downgoing   Left: downgoing Cerebellar: Normal finger-to-nose and normal heel-to-shin testing bilaterally Gait: not tested due to safety concerns    Laboratory Studies:   Basic Metabolic Panel:  Recent Labs Lab 02/13/16 1619 02/14/16 0443  NA 137 139  K 3.8 4.2  CL 105 106  CO2 25 26  GLUCOSE 145* 113*  BUN 9 12  CREATININE 0.67 0.74  CALCIUM 9.0 8.6*    Liver Function Tests:  Recent Labs Lab 02/13/16 1619  AST 33  ALT 38  ALKPHOS 85  BILITOT 0.4   PROT 7.6  ALBUMIN 4.1   No results for input(s): LIPASE, AMYLASE in the last 168 hours. No results for input(s): AMMONIA in the last 168 hours.  CBC:  Recent Labs Lab 02/13/16 1619 02/14/16 0443  WBC 7.4 6.4  NEUTROABS 4.1  --   HGB 14.4 13.6  HCT 40.9 39.5*  MCV 89.6 91.1  PLT 194 175    Cardiac Enzymes:  Recent Labs Lab 02/13/16 1619  TROPONINI <0.03    BNP: Invalid input(s): POCBNP  CBG: No results for input(s): GLUCAP in the last 168 hours.  Microbiology: Results for orders placed or performed in visit on 01/06/13  Stool culture     Status: None   Collection Time: 01/06/13  6:30 AM  Result Value Ref Range Status   Micro Text Report   Final       ORGANISM 1                HEAVY GROWTH YEAST-ID CANDIDA ALBICANS   COMMENT                   NO SALMONELLA OR SHIGELLA ISOLATED   COMMENT                   NO PATHOGENIC E.COLI DETECTED   COMMENT                   NO CAMPYLOBACTER ANTIGEN DETECTED   ANTIBIOTIC                    ORG#1                                               Clostridium Difficile by PCR     Status: None   Collection Time: 01/06/13  6:30 AM  Result Value Ref Range Status   Micro Text Report   Final       COMMENT                   NEGATIVE-CLOS.DIFFICILE TOXIN NOT DETECTED BY PCR   ANTIBIOTIC                                                        Coagulation Studies:  Recent Labs  02/13/16 1619  LABPROT 12.5  INR 0.93    Urinalysis: No results for input(s): COLORURINE, LABSPEC, PHURINE, GLUCOSEU, HGBUR, BILIRUBINUR, KETONESUR, PROTEINUR, UROBILINOGEN, NITRITE, LEUKOCYTESUR in the last 168 hours.  Invalid input(s): APPERANCEUR  Lipid Panel:     Component Value Date/Time   CHOL 188 02/13/2016 1619   TRIG 276 (H) 02/13/2016 1619   HDL 34 (L) 02/13/2016 1619   CHOLHDL 5.5 02/13/2016 1619   VLDL 55 (H) 02/13/2016 1619   LDLCALC 99 02/13/2016 1619    HgbA1C:  Lab Results  Component Value Date   HGBA1C 6.3 (H)  02/13/2016    Urine Drug Screen:     Component Value Date/Time   LABOPIA NONE DETECTED 11/05/2014 0016   COCAINSCRNUR NONE DETECTED 11/05/2014 0016   LABBENZ NONE DETECTED 11/05/2014 0016   AMPHETMU NONE DETECTED 11/05/2014 0016   THCU NONE DETECTED 11/05/2014 0016   LABBARB NONE DETECTED 11/05/2014 0016    Alcohol Level: No results for input(s): ETH in the last 168 hours.  Other results: EKG: sinus rhythm at 76 bpm.  Imaging: Mr Shirlee LatchMra Head WUWo Contrast  Result Date: 02/14/2016 CLINICAL DATA:  Diabetes. Bipolar disorder. Acute onset of dizziness and balance disturbance yesterday. EXAM: MRI HEAD WITHOUT CONTRAST MRA HEAD WITHOUT CONTRAST TECHNIQUE: Multiplanar, multiecho pulse sequences of the brain and surrounding structures were obtained without intravenous contrast. Angiographic images of the head were obtained using MRA technique without contrast. COMPARISON:  CT 02/13/2016.  MRI evaluation 11/04/2014 FINDINGS: MRI HEAD FINDINGS Brain: The brain has normal appearance without evidence of malformation, atrophy, old or acute small or large vessel infarction, hemorrhage, hydrocephalus or extra-axial collection. No pituitary abnormality. Mega cisterna magna incidentally noted. Vascular: Major vessels at the base of the brain show flow. Skull and upper cervical spine: Normal Sinuses/Orbits: Clear/ normal. Other: None significant. MRA HEAD FINDINGS Both internal carotid arteries are widely patent into the brain. No siphon stenosis. The anterior and middle cerebral vessels are patent. There is a somewhat irregular appearance at the MCA bifurcation region on the right. I question if there could be a focal stenosis or a small aneurysm. This is not definite. CT angiography would be suggested to evaluate this area more precisely. This area did look normal on the examinations of 2016. Both vertebral arteries are widely patent to the basilar. No basilar stenosis. Posterior circulation branch vessels appear  normal. IMPRESSION: Normal MRI of the brain. No major vessel occlusion. Question abnormal appearance at the right MCA bifurcation region raising the possibility of focal stenosis or small aneurysm. CT angiography suggested to evaluate this area with more clarity. Electronically Signed   By: Paulina FusiMark  Shogry M.D.   On: 02/14/2016 10:24   Mr Brain Wo Contrast  Result Date: 02/14/2016 CLINICAL DATA:  Diabetes. Bipolar disorder. Acute onset of dizziness and balance disturbance yesterday. EXAM: MRI HEAD WITHOUT CONTRAST MRA HEAD WITHOUT CONTRAST TECHNIQUE: Multiplanar, multiecho pulse sequences of the brain and surrounding structures were obtained without intravenous contrast. Angiographic images of the head were obtained using MRA technique without contrast. COMPARISON:  CT 02/13/2016.  MRI evaluation 11/04/2014 FINDINGS: MRI HEAD FINDINGS Brain: The brain has normal appearance without evidence of malformation, atrophy, old or acute small or large vessel infarction, hemorrhage, hydrocephalus or extra-axial collection. No pituitary abnormality. Mega cisterna magna incidentally noted. Vascular: Major vessels at the base of the brain show flow. Skull and upper cervical spine: Normal Sinuses/Orbits: Clear/ normal. Other: None significant. MRA HEAD FINDINGS Both internal carotid arteries are widely patent into the brain. No siphon stenosis. The  anterior and middle cerebral vessels are patent. There is a somewhat irregular appearance at the MCA bifurcation region on the right. I question if there could be a focal stenosis or a small aneurysm. This is not definite. CT angiography would be suggested to evaluate this area more precisely. This area did look normal on the examinations of 2016. Both vertebral arteries are widely patent to the basilar. No basilar stenosis. Posterior circulation branch vessels appear normal. IMPRESSION: Normal MRI of the brain. No major vessel occlusion. Question abnormal appearance at the right MCA  bifurcation region raising the possibility of focal stenosis or small aneurysm. CT angiography suggested to evaluate this area with more clarity. Electronically Signed   By: Paulina Fusi M.D.   On: 02/14/2016 10:24   US Carotid Bilateral  Result Date: 02/14/2016 CLINICAL DATA:  CVA, TIA, syncopal episode, visual disturbance and hyperlipidemia. History of diabetes and smoking. EXAM: BILATERAL CAROTID DUPLEX ULTRASOUND TECHNIQUE: Wallace Cullens scale imaging, color Doppler and duplex ultrasound were performed of bilateral carotid and vertebral arteries in the neck. COMPARISON:  Head CT - 02/13/2016 FINDINGS: Criteria: Quantification of carotid stenosis is based on velocity parameters that correlate the residual internal carotid diameter with NASCET-based stenosis levels, using the diameter of the distal internal carotid lumen as the denominator for stenosis measurement. The following velocity measurements were obtained: RIGHT ICA:  86/41 cm/sec CCA:  100/39 cm/sec SYSTOLIC ICA/CCA RATIO:  0.9 DIASTOLIC ICA/CCA RATIO:  1.1 ECA:  80 cm/sec LEFT ICA:  124/28 cm/sec CCA:  97/35 cm/sec SYSTOLIC ICA/CCA RATIO:  1.3 DIASTOLIC ICA/CCA RATIO:  0.8 ECA:  100 cm/sec RIGHT CAROTID ARTERY: There is a minimal amount of eccentric mixed echogenic plaque within the right carotid bulb (images 11 and 12), extending to involve the origin and proximal aspect the right internal carotid artery (image 19), not resulting in elevated peak systolic velocities within the interrogated course the right internal carotid artery to suggest a hemodynamically significant stenosis. RIGHT VERTEBRAL ARTERY:  Antegrade Flow LEFT CAROTID ARTERY: There is a minimal amount of eccentric hypoechoic plaque involving the proximal aspect of the left common carotid artery (image 31). There is a minimal amount of intimal wall thickening involving the proximal aspect of the left internal carotid artery (image 47), not resulting in elevated peak systolic velocities within  the interrogated course the left internal carotid artery to suggest a hemodynamically significant stenosis. LEFT VERTEBRAL ARTERY:  Antegrade Flow IMPRESSION: Minimal amount of bilateral atherosclerotic plaque, right greater than left, not resulting in a hemodynamically significant stenosis within either internal carotid artery. Electronically Signed   By: Simonne Come M.D.   On: 02/14/2016 10:11   Ct Head Code Stroke W/o Cm  Result Date: 02/13/2016 CLINICAL DATA:  Code stroke. Acute onset of dizziness, slurred speech, and headache. EXAM: CT HEAD WITHOUT CONTRAST TECHNIQUE: Contiguous axial images were obtained from the base of the skull through the vertex without intravenous contrast. COMPARISON:  06/19/2015 FINDINGS: Brain: There is no evidence of acute cortical infarct, intracranial hemorrhage, mass, midline shift, or extra-axial fluid collection. The ventricles and sulci are normal. Mild enlargement of the cisterna magna is again noted, a normal variant. Vascular: No hyperdense vessel. Minimal carotid siphon calcified plaque. Skull: No fracture or focal osseous lesion. Sinuses/Orbits: Visualized paranasal sinuses and mastoid air cells are clear. Orbits are unremarkable. Other: None. ASPECTS [facility] Stroke Program Early CT Score) - Ganglionic level infarction (caudate, lentiform nuclei, internal capsule, insula, M1-M3 cortex): 7 - Supraganglionic infarction (M4-M6 cortex): 3 Total score (0-10 with  10 being normal): 10 IMPRESSION: 1. No evidence of acute intracranial abnormality. 2. ASPECTS is 10. These results were called by telephone at the time of interpretation on 02/13/2016 at 4:23 pm to Dr. Roxan Hockey, who verbally acknowledged these results. Electronically Signed   By: Sebastian Ache M.D.   On: 02/13/2016 16:24     Assessment/Plan: 50 year old male with a history hemiplegic migraine who presents with headache, left hemiplegia and difficulty with speech.  Neurological examination has multiple  functional features.  Patient in no distress from headache.  Head CT personally reviewed and shows no acute changes.  MRI of the brain reviewed as well and shows no acute changes. Question of right MCA bifurcation abnormality.  No abnormality suggested in imaging in 2016 with similar admission.  Will follow up since symptoms on the left.    Recommendations: 1.  PT/OT and speech consults 2.  EEG 3.  CTA of head   4.  Magnesium IV for headache   Thana Farr, MD Neurology [phone] 02/14/2016, 11:15 AM

## 2016-02-14 NOTE — Progress Notes (Signed)
PT Cancellation Note  Patient Details Name: Kevin Boyd MRN: [id number] DOB: [birth year]   Cancelled Treatment:    Reason Eval/Treat Not Completed: Other (comment). Evaluation received and chart reviewed. Evaluation attempted, however pt out of room for testing. Will re-attempt.   Azilee Pirro 02/14/2016, 8:56 AM Elizabeth PalauStephanie Kimyetta Flott, PT, DPT [phone]

## 2016-02-14 NOTE — Evaluation (Signed)
Physical Therapy Evaluation Patient Details Name: Velora MediateRobert Schwalbe MRN: [id number] DOB: 01-25-1966 Today's Date: 02/14/2016   History of Present Illness  Pt admitted for TIA. Pt with history of scoliosis, bipolar, and DM. Pt with complaints of worsening speech, ataxia, dizziness, and L side weaknes. Pt with negative imaging at this time. Pt complains of headache during evaluation rating at 8/10.   Clinical Impression  Pt is a pleasant 50 year old male who was admitted for TIA. Pt able to recall name of objects in room without too much difficulty. Stuttering speech appears to be worse when Neurologist enters room. Pt oriented and alert. Pt performs bed mobility, transfers, and pre gait activities with min assist and RW used. Pt appears with more distal weakness compared to proximal in L UE/LE. Pt also with difficulty with coordination and light touch sensation. Pt complains of headache, however does not affect performance. Wife gave pt essential oils on head to alleviate pain. Pt demonstrates deficits with strength/mobility/coordination/speech. Would benefit from skilled PT to address above deficits and promote optimal return to PLOF; recommend transition to STR upon discharge from acute hospitalization. Will perform re-assessment next date as I expect as symptoms improve, pt may be more independent with functional mobility.       Follow Up Recommendations SNF    Equipment Recommendations       Recommendations for Other Services       Precautions / Restrictions Precautions Precautions: Fall Restrictions Weight Bearing Restrictions: No      Mobility  Bed Mobility Overal bed mobility: Needs Assistance Bed Mobility: Supine to Sit     Supine to sit: Min assist     General bed mobility comments: Unable to assist on L UE/LE exiting bed towards L side.  Able to hook R foot under and initiate swinging legs off bed. Needs assist for trunk support. Once seated at EOB, able to sit with  cga.  Transfers Overall transfer level: Needs assistance Equipment used: Rolling walker (2 wheeled) Transfers: Sit to/from Stand Sit to Stand: Min assist         General transfer comment: needs assist for L foot placement. Able to elevate and place hand on RW to grip. Bed elevated for ease of transfer, needs min assist for upright posture. Leaning towards R side noted  Ambulation/Gait Ambulation/Gait assistance: Min assist           General Gait Details: Attempted pre gait activities, however L LE unable to accept weight without knee buckling. Unable to safely take steps at this time. Controlled descent noted back to seated position on bedside.  Stairs            Wheelchair Mobility    Modified Rankin (Stroke Patients Only)       Balance Overall balance assessment: Needs assistance Sitting-balance support: Feet supported;Single extremity supported Sitting balance-Leahy Scale: Fair     Standing balance support: Bilateral upper extremity supported Standing balance-Leahy Scale: Fair                               Pertinent Vitals/Pain Pain Assessment: 0-10 Pain Score: 8  Pain Location: headache Pain Descriptors / Indicators: Aching Pain Intervention(s): Limited activity within patient's tolerance    Home Living Family/patient expects to be discharged to:: Private residence Living Arrangements: Spouse/significant other Available Help at Discharge: Family Type of Home: House Home Access: Stairs to enter Entrance Stairs-Rails: None Entrance Stairs-Number of Steps: 2 Home Layout: One  level Home Equipment: Walker - 2 wheels      Prior Function Level of Independence: Independent         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  No tone noted L UE   Lower Extremity Assessment: Generalized weakness;LLE deficits/detail (L LE grossly)   LLE Deficits / Details: dorsiflexion 2/5; hip flexors 3+/5 No tone noted     Communication   Communication: Expressive difficulties  Cognition Arousal/Alertness: Awake/alert Behavior During Therapy: WFL for tasks assessed/performed Overall Cognitive Status: Within Functional Limits for tasks assessed                      General Comments      Exercises Other Exercises Other Exercises: supine ther-ex performed on L side including quad sets, SLRs, and hip abd/add. Pt needs min assist for completion and cues to not hold breath during ther-ex. All ther-ex performed with safe technique x 10 reps. Pt educated on correct frequency and duration of ther-ex   Assessment/Plan    PT Assessment Patient needs continued PT services  PT Problem List Decreased strength;Decreased activity tolerance;Decreased balance;Decreased mobility;Pain;Impaired sensation;Obesity;Decreased coordination          PT Treatment Interventions DME instruction;Gait training;Stair training;Functional mobility training;Therapeutic activities;Therapeutic exercise;Balance training    PT Goals (Current goals can be found in the Care Plan section)  Acute Rehab PT Goals Patient Stated Goal: to get stronger and walk PT Goal Formulation: With patient Time For Goal Achievement: 02/28/16 Potential to Achieve Goals: Good    Frequency 7X/week   Barriers to discharge Inaccessible home environment;Decreased caregiver support      Co-evaluation               End of Session Equipment Utilized During Treatment: Gait belt Activity Tolerance: Patient tolerated treatment well Patient left: in bed;with bed alarm set Nurse Communication: Mobility status    Functional Assessment Tool Used: clinical judgement Functional Limitation: Mobility: Walking and moving around Mobility: Walking and Moving  Around Current Status ([id number](G8978): At least 40 percent but less than 60 percent impaired, limited or restricted Mobility: Walking and Moving Around Goal Status [phone](G8979): At least 20 percent but less than 40 percent impaired, limited or restricted    Time: [id number] PT Time Calculation (min) (ACUTE ONLY): 32 min   Charges:   PT Evaluation $PT Eval Moderate Complexity: 1 Procedure PT Treatments $Therapeutic Exercise: 8-22 mins   PT G Codes:   PT G-Codes **NOT FOR INPATIENT CLASS** Functional Assessment Tool Used: clinical judgement Functional Limitation: Mobility: Walking and moving around Mobility: Walking and Moving Around Current Status ([id number](G8978): At least 40 percent but less than 60 percent impaired, limited or restricted Mobility: Walking and Moving Around Goal Status [phone](G8979): At least 20 percent but less than 40 percent impaired, limited or restricted    Drinda Belgard 02/14/2016, 2:45 PM  Elizabeth PalauStephanie Keidrick Murty, PT, DPT [phone]

## 2016-02-14 NOTE — Evaluation (Signed)
Occupational Therapy Evaluation Patient Details Name: Kevin MediateRobert Boyd MRN: [id number] DOB: 1966/01/31 Today's Date: 02/14/2016    History of Present Illness Pt. is a 50 y.o. male who [facility] workup of a TIA.   Clinical Impression   Pt. Is a  50 y.o male who [facility] work up of a [facility]IA. Pt. presents with weakness, decreased strength, impaired coordination, and decreased functional mobility which hinder his ability to complete ADL and IADL functioning. Pt. could benefit from skilled OT services for ADL training, neuro re-ed, there. ex., and pt./family education. Pt. could benefit from SNF level of care.    Follow Up Recommendations  SNF    Equipment Recommendations       Recommendations for Other Services       Precautions / Restrictions Precautions Precautions: Fall Restrictions Weight Bearing Restrictions: No              ADL Overall ADL's : Needs assistance/impaired Eating/Feeding: Set up (Uses his right hand)   Grooming: Set up (With right hand)                                 General ADL Comments: Pt. uses his dominant right hand to complete ADL tasks. Pt. is unable to use his left hand to complete.     Vision     Perception     Praxis      Pertinent Vitals/Pain Pain Assessment: 0-10 Pain Score:  (not rated) Pain Location: headache Pain Descriptors / Indicators: Aching Pain Intervention(s): Limited activity within patient's tolerance     Hand Dominance Right   Extremity/Trunk Assessment Upper Extremity Assessment Upper Extremity Assessment: Overall WFL for tasks assessed LUE Deficits / Details: bilateral shoulder flexion/abduction 4+/5, elbow flexion/extension 3/5, thumb opposition to the tip of the 5th digit. Grip strength: R: 95#, Left: 6#.  Intact sensory, and proprioceptive awareness. LUE Sensation: decreased light touch LUE Coordination: decreased fine motor       Communication  Communication Communication: Expressive difficulties   Cognition Arousal/Alertness: Awake/alert Behavior During Therapy: WFL for tasks assessed/performed Overall Cognitive Status: Within Functional Limits for tasks assessed                     General Comments       Exercises   Shoulder Instructions      Home Living Family/patient expects to be discharged to:: Private residence Living Arrangements: Spouse/significant other Available Help at Discharge: Family Type of Home: House Home Access: Stairs to enter Secretary/administratorntrance Stairs-Number of Steps: 2 Entrance Stairs-Rails: None Home Layout: One level     Bathroom Shower/Tub: Tub/shower unit;Curtain Shower/tub characteristics: Curtain       Home Equipment: Environmental consultantWalker - 2 wheels          Prior Functioning/Environment Level of Independence: Independent        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        OT Problem List: Decreased strength;Pain;Decreased knowledge of use of DME or AE   OT Treatment/Interventions: Self-care/ADL training;Therapeutic exercise;DME and/or AE instruction;Patient/family education;Therapeutic activities    OT Goals(Current goals can be found in the care plan section) Acute Rehab OT Goals Patient Stated Goal: to get stronger and walk  OT Frequency:     Barriers to D/C:            Co-evaluation  End of Session    Activity Tolerance: Patient tolerated treatment well Patient left: in bed;with call bell/phone within reach;with bed alarm set   Time: [id number] OT Time Calculation (min): 28 min Charges:  OT General Charges $OT Visit: 1 Procedure OT Evaluation $OT Eval Moderate Complexity: 1 Procedure G-Codes: OT G-codes **NOT FOR INPATIENT CLASS** Functional Assessment Tool Used: Clinical observation based on pt. current functional status Functional Limitation: Self care Self Care Current Status ([id number](G8987): At least 40 percent but less than 60 percent  impaired, limited or restricted Self Care Goal Status ([id number](G8988): At least 40 percent but less than 60 percent impaired, limited or restricted  Olegario MessierElaine Jefferie Holston, MS, OTR/L 02/14/2016, 4:49 PM

## 2016-02-14 NOTE — Progress Notes (Signed)
SLP Cancellation Note  Patient Details Name: Kevin MediateRobert Delbene MRN: [id number] DOB: 1966/02/17   Cancelled treatment:       Reason Eval/Treat Not Completed: SLP screened, no needs identified, will sign off. Chart reviewed and Nursing consulted. Pt denies any swallowing deficits. ST services observed Pt eating a portion of his lunch meal and no swallowing deficits were noted. Pt notes swallowing abilities are at his baseline. Pt states his speech fluency is "not normal". Pt acknowledged a baseline history of dysfluent speech in times of stress or anxiety. Pt's speech is functional to convey his wants and needs at this time. Family agree. Pt's girlfriend states "once he is out of the hospital his speech will go back to normal". Nursing also notes Pt is able to use speech-language to convey wants and needs functionally. ST services will provide information regarding Out Patient Speech Language Therapy Services or Home Health Speech Language Therapy Services, should the Pt feel his speech fluency has not returned to his baseline post discharge. ST services are available for additional education and evaluation if any decline in status during admission.   Altamese DillingLauren Aniello Christopoulos, B.A. Clinical Graduate Student 02/14/2016, 12:30 PM

## 2016-02-14 NOTE — Progress Notes (Signed)
OT Cancellation Note  Patient Details Name: Kevin MediateRobert Aslin MRN: [id number] DOB: 11-20-1965   Cancelled Treatment:    Reason Eval/Treat Not Completed: Patient at procedure or test/ unavailable (Pt. continues to be out of room at testing. Will reattempt  at a later date/time.)  Olegario MessierElaine Trevonne Nyland, MS, OTR/L 02/14/2016, 2:31 PM

## 2016-02-14 NOTE — Progress Notes (Signed)
[facility] [place] at [facility]   PATIENT NAME: Kevin MediateRobert Lafitte    MR#:  [id number]  DATE OF BIRTH:  [birth year]  SUBJECTIVE:  CHIEF COMPLAINT:  No chief complaint on file.  The patient is a 50 year old Caucasian male with past medical history significant for history of hemiplegic migraine, who is followed by Dr. Dr. Clelia CroftShaw on outpatient basis, who presented to the hospital with complaints of dizziness, being off balance, having problems with speech and left-sided weakness. He also complained of headache. 8/10 by density. He had CT scan of the head as well as MRI of the brain, MRA, which were unremarkable, although right MCA bifurcation, abnormal appearance was noted. The patient was seen by neurologist and recommended electroencephalogram, CTA of the head, physical, occupational, speech therapist, evaluations, magnesium intravenously for headache.   Review of Systems  Constitutional: Negative for chills, fever and weight loss.  HENT: Negative for congestion.   Eyes: Negative for blurred vision and double vision.  Respiratory: Negative for cough, sputum production, shortness of breath and wheezing.   Cardiovascular: Negative for chest pain, palpitations, orthopnea, leg swelling and PND.  Gastrointestinal: Negative for abdominal pain, blood in stool, constipation, diarrhea, nausea and vomiting.  Genitourinary: Negative for dysuria, frequency, hematuria and urgency.  Musculoskeletal: Negative for falls.  Neurological: Positive for dizziness, speech change, focal weakness and headaches. Negative for tremors.  Endo/Heme/Allergies: Does not bruise/bleed easily.  Psychiatric/Behavioral: Negative for depression. The patient does not have insomnia.     VITAL SIGNS: Blood pressure 124/76, pulse 63, temperature 98.2 F (36.8 C), temperature source Oral, resp. rate 20, height 6\' 2"  (1.88 m), weight 132.6 kg (292 lb 5 oz), SpO2 95 %.  PHYSICAL EXAMINATION:   GENERAL:  50  y.o.-year-old patient lying in the bed with no acute distress, sitting when speaking, some expressive aphasia. No facial weakness EYES: Pupils equal, round, reactive to light and accommodation. No scleral icterus. Extraocular muscles intact.  HEENT: Head atraumatic, normocephalic. Oropharynx and nasopharynx clear.  NECK:  Supple, no jugular venous distention. No thyroid enlargement, no tenderness.  LUNGS: Normal breath sounds bilaterally, no wheezing, rales,rhonchi or crepitation. No use of accessory muscles of respiration.  CARDIOVASCULAR: S1, S2 normal. No murmurs, rubs, or gallops.  ABDOMEN: Soft, nontender, nondistended. Bowel sounds present. No organomegaly or mass.  EXTREMITIES: No pedal edema, cyanosis, or clubbing.  NEUROLOGIC: Cranial nerves II through XII are intact. Muscle strength 2/5 in left lower extremity, 4/5 in left upper extremity. Sensation intact. Gait not checked.  PSYCHIATRIC: The patient is alert and oriented x 3.  SKIN: No obvious rash, lesion, or ulcer.   ORDERS/RESULTS REVIEWED:   CBC  Recent Labs Lab 02/13/16 1619 02/14/16 0443  WBC 7.4 6.4  HGB 14.4 13.6  HCT 40.9 39.5*  PLT 194 175  MCV 89.6 91.1  MCH 31.5 31.4  MCHC 35.2 34.4  RDW 13.1 13.2  LYMPHSABS 2.5  --   MONOABS 0.5  --   EOSABS 0.2  --   BASOSABS 0.0  --    ------------------------------------------------------------------------------------------------------------------  Chemistries   Recent Labs Lab 02/13/16 1619 02/14/16 0443  NA 137 139  K 3.8 4.2  CL 105 106  CO2 25 26  GLUCOSE 145* 113*  BUN 9 12  CREATININE 0.67 0.74  CALCIUM 9.0 8.6*  AST 33  --   ALT 38  --   ALKPHOS 85  --   BILITOT 0.4  --    ------------------------------------------------------------------------------------------------------------------ estimated creatinine clearance is 161.8 mL/min (by C-G  formula based on SCr of 0.74  mg/dL). ------------------------------------------------------------------------------------------------------------------  Recent Labs  02/13/16 1619  TSH 1.527    Cardiac Enzymes  Recent Labs Lab 02/13/16 1619  TROPONINI <0.03   ------------------------------------------------------------------------------------------------------------------ Invalid input(s): POCBNP ---------------------------------------------------------------------------------------------------------------  RADIOLOGY: Mr Shirlee LatchMra Head Wo Contrast  Result Date: 02/14/2016 CLINICAL DATA:  Diabetes. Bipolar disorder. Acute onset of dizziness and balance disturbance yesterday. EXAM: MRI HEAD WITHOUT CONTRAST MRA HEAD WITHOUT CONTRAST TECHNIQUE: Multiplanar, multiecho pulse sequences of the brain and surrounding structures were obtained without intravenous contrast. Angiographic images of the head were obtained using MRA technique without contrast. COMPARISON:  CT 02/13/2016.  MRI evaluation 11/04/2014 FINDINGS: MRI HEAD FINDINGS Brain: The brain has normal appearance without evidence of malformation, atrophy, old or acute small or large vessel infarction, hemorrhage, hydrocephalus or extra-axial collection. No pituitary abnormality. Mega cisterna magna incidentally noted. Vascular: Major vessels at the base of the brain show flow. Skull and upper cervical spine: Normal Sinuses/Orbits: Clear/ normal. Other: None significant. MRA HEAD FINDINGS Both internal carotid arteries are widely patent into the brain. No siphon stenosis. The anterior and middle cerebral vessels are patent. There is a somewhat irregular appearance at the MCA bifurcation region on the right. I question if there could be a focal stenosis or a small aneurysm. This is not definite. CT angiography would be suggested to evaluate this area more precisely. This area did look normal on the examinations of 2016. Both vertebral arteries are widely patent to the basilar.  No basilar stenosis. Posterior circulation branch vessels appear normal. IMPRESSION: Normal MRI of the brain. No major vessel occlusion. Question abnormal appearance at the right MCA bifurcation region raising the possibility of focal stenosis or small aneurysm. CT angiography suggested to evaluate this area with more clarity. Electronically Signed   By: Paulina FusiMark  Shogry M.D.   On: 02/14/2016 10:24   Mr Brain Wo Contrast  Result Date: 02/14/2016 CLINICAL DATA:  Diabetes. Bipolar disorder. Acute onset of dizziness and balance disturbance yesterday. EXAM: MRI HEAD WITHOUT CONTRAST MRA HEAD WITHOUT CONTRAST TECHNIQUE: Multiplanar, multiecho pulse sequences of the brain and surrounding structures were obtained without intravenous contrast. Angiographic images of the head were obtained using MRA technique without contrast. COMPARISON:  CT 02/13/2016.  MRI evaluation 11/04/2014 FINDINGS: MRI HEAD FINDINGS Brain: The brain has normal appearance without evidence of malformation, atrophy, old or acute small or large vessel infarction, hemorrhage, hydrocephalus or extra-axial collection. No pituitary abnormality. Mega cisterna magna incidentally noted. Vascular: Major vessels at the base of the brain show flow. Skull and upper cervical spine: Normal Sinuses/Orbits: Clear/ normal. Other: None significant. MRA HEAD FINDINGS Both internal carotid arteries are widely patent into the brain. No siphon stenosis. The anterior and middle cerebral vessels are patent. There is a somewhat irregular appearance at the MCA bifurcation region on the right. I question if there could be a focal stenosis or a small aneurysm. This is not definite. CT angiography would be suggested to evaluate this area more precisely. This area did look normal on the examinations of 2016. Both vertebral arteries are widely patent to the basilar. No basilar stenosis. Posterior circulation branch vessels appear normal. IMPRESSION: Normal MRI of the brain. No  major vessel occlusion. Question abnormal appearance at the right MCA bifurcation region raising the possibility of focal stenosis or small aneurysm. CT angiography suggested to evaluate this area with more clarity. Electronically Signed   By: Paulina FusiMark  Shogry M.D.   On: 02/14/2016 10:24   [place] Carotid Bilateral  Result Date: 02/14/2016 CLINICAL  DATA:  CVA, TIA, syncopal episode, visual disturbance and hyperlipidemia. History of diabetes and smoking. EXAM: BILATERAL CAROTID DUPLEX ULTRASOUND TECHNIQUE: Wallace Cullens scale imaging, color Doppler and duplex ultrasound were performed of bilateral carotid and vertebral arteries in the neck. COMPARISON:  Head CT - 02/13/2016 FINDINGS: Criteria: Quantification of carotid stenosis is based on velocity parameters that correlate the residual internal carotid diameter with NASCET-based stenosis levels, using the diameter of the distal internal carotid lumen as the denominator for stenosis measurement. The following velocity measurements were obtained: RIGHT ICA:  86/41 cm/sec CCA:  100/39 cm/sec SYSTOLIC ICA/CCA RATIO:  0.9 DIASTOLIC ICA/CCA RATIO:  1.1 ECA:  80 cm/sec LEFT ICA:  124/28 cm/sec CCA:  97/35 cm/sec SYSTOLIC ICA/CCA RATIO:  1.3 DIASTOLIC ICA/CCA RATIO:  0.8 ECA:  100 cm/sec RIGHT CAROTID ARTERY: There is a minimal amount of eccentric mixed echogenic plaque within the right carotid bulb (images 11 and 12), extending to involve the origin and proximal aspect the right internal carotid artery (image 19), not resulting in elevated peak systolic velocities within the interrogated course the right internal carotid artery to suggest a hemodynamically significant stenosis. RIGHT VERTEBRAL ARTERY:  Antegrade Flow LEFT CAROTID ARTERY: There is a minimal amount of eccentric hypoechoic plaque involving the proximal aspect of the left common carotid artery (image 31). There is a minimal amount of intimal wall thickening involving the proximal aspect of the left internal carotid artery  (image 47), not resulting in elevated peak systolic velocities within the interrogated course the left internal carotid artery to suggest a hemodynamically significant stenosis. LEFT VERTEBRAL ARTERY:  Antegrade Flow IMPRESSION: Minimal amount of bilateral atherosclerotic plaque, right greater than left, not resulting in a hemodynamically significant stenosis within either internal carotid artery. Electronically Signed   By: Simonne Come M.D.   On: 02/14/2016 10:11   Ct Head Code Stroke W/o Cm  Result Date: 02/13/2016 CLINICAL DATA:  Code stroke. Acute onset of dizziness, slurred speech, and headache. EXAM: CT HEAD WITHOUT CONTRAST TECHNIQUE: Contiguous axial images were obtained from the base of the skull through the vertex without intravenous contrast. COMPARISON:  06/19/2015 FINDINGS: Brain: There is no evidence of acute cortical infarct, intracranial hemorrhage, mass, midline shift, or extra-axial fluid collection. The ventricles and sulci are normal. Mild enlargement of the cisterna magna is again noted, a normal variant. Vascular: No hyperdense vessel. Minimal carotid siphon calcified plaque. Skull: No fracture or focal osseous lesion. Sinuses/Orbits: Visualized paranasal sinuses and mastoid air cells are clear. Orbits are unremarkable. Other: None. ASPECTS [facility] Stroke Program Early CT Score) - Ganglionic level infarction (caudate, lentiform nuclei, internal capsule, insula, M1-M3 cortex): 7 - Supraganglionic infarction (M4-M6 cortex): 3 Total score (0-10 with 10 being normal): 10 IMPRESSION: 1. No evidence of acute intracranial abnormality. 2. ASPECTS is 10. These results were called by telephone at the time of interpretation on 02/13/2016 at 4:23 pm to Dr. Roxan Hockey, who verbally acknowledged these results. Electronically Signed   By: Sebastian Ache M.D.   On: 02/13/2016 16:24    EKG:  Orders placed or performed during the hospital encounter of 02/13/16  . EKG 12-Lead  . EKG 12-Lead     ASSESSMENT AND PLAN:  Active Problems:   TIA (transient ischemic attack) #1. Hemiplegic migraine, patient's MRI showed no stroke, although focal stenosis or small aneurysm was of concern in the right MCA bifurcation region, patient was seen by neurologist, recommended electroencephalogram, CTA of the head, PT, OT, SLP evaluation, magnesium for headaches #2. Hypertriglyceridemia, TSH was normal, hemoglobin  A1c was 6.3, pre-diabetes #3. Essential hypertension, well-controlled #4. prediabetes, initiate patient on diabetic diet  Management plans discussed with the patient, family and they are in agreement.   DRUG ALLERGIES: No Known Allergies  CODE STATUS:     Code Status Orders        Start     Ordered   02/13/16 1849  Full code  Continuous     02/13/16 1848    Code Status History    Date Active Date Inactive Code Status Order ID Comments User Context   11/03/2014 11:01 PM 11/07/2014  2:11 AM Full Code [id number]  Oralia Manis, MD Inpatient      TOTAL TIME TAKING CARE OF THIS PATIENT: 40 minutes.    Katharina Caper M.D on 02/14/2016 at 12:01 PM  Between 7am to 6pm - Pager - [phone]  After 6pm go to [email or web address] - password [email or web address]  [facility]  Office  [phone]  CC: Primary care physician; Marisue Ivan, MD

## 2016-02-14 NOTE — Progress Notes (Signed)
  Echocardiogram 2D Echocardiogram with Bubble study has been performed.  Kevin SavoyCasey N Izel Hochberg 02/14/2016, 9:19 AM

## 2016-02-14 NOTE — Progress Notes (Signed)
OT Cancellation Note  Patient Details Name: Kevin MediateRobert Defoor MRN: [id number] DOB: [birth year]   Cancelled Treatment:    Reason Eval/Treat Not Completed: Patient at procedure or test/ unavailable (Will attempt initial eval at a later time or date.)  Olegario MessierElaine Mattea Seger, MS, OTR/L 02/14/2016, 10:28 AM

## 2016-02-15 DIAGNOSIS — I10 Essential (primary) hypertension: Secondary | ICD-10-CM

## 2016-02-15 DIAGNOSIS — E781 Pure hyperglyceridemia: Secondary | ICD-10-CM

## 2016-02-15 DIAGNOSIS — G8194 Hemiplegia, unspecified affecting left nondominant side: Secondary | ICD-10-CM | POA: Diagnosis not present

## 2016-02-15 DIAGNOSIS — R7303 Prediabetes: Secondary | ICD-10-CM

## 2016-02-15 LAB — CARBAMAZEPINE LEVEL, TOTAL: CARBAMAZEPINE LVL: 5.5 ug/mL (ref 4.0–12.0)

## 2016-02-15 NOTE — Discharge Summary (Signed)
[facility] - [place] at [facility]   PATIENT NAME: Kevin Boyd    MR#:  [id number]  DATE OF BIRTH:  18-Apr-1965  DATE OF ADMISSION:  02/13/2016 ADMITTING PHYSICIAN: Enid Baas, MD  DATE OF DISCHARGE: No discharge date for patient encounter.  PRIMARY CARE PHYSICIAN: Marisue Ivan, MD     ADMISSION DIAGNOSIS:  TIA (transient ischemic attack) [G45.9] Cerebral infarction (HCC) [I63.9] CVA (cerebral infarction) [I63.9] Cerebrovascular accident (CVA), unspecified mechanism (HCC) [I63.9]  DISCHARGE DIAGNOSIS:  Principal Problem:   Hemiplegic migraine Active Problems:   Left hemiparesis (HCC)   Hypertriglyceridemia   Prediabetes   Essential hypertension   SECONDARY DIAGNOSIS:   Past Medical History:  Diagnosis Date  . Asthma   . Bipolar 1 disorder (HCC)   . Borderline diabetes mellitus   . C. difficile diarrhea   . COPD (chronic obstructive pulmonary disease) (HCC)   . GERD (gastroesophageal reflux disease)   . Interstitial lung disease (HCC)   . Scoliosis   . Seasonal allergies   . Sleep apnea     .pro HOSPITAL COURSE:  The patient is a 50 year old Caucasian male with past medical history significant for history of hemiplegic migraine, who is followed by Dr. Dr. Clelia Croft on outpatient basis, who presented to the hospital with complaints of dizziness, being off balance, having problems with speech and left-sided weakness. He also complained of headache. 8/10 by density. He had CT scan of the head as well as MRI of the brain, MRA, which were unremarkable, although right MCA bifurcation, abnormal appearance was noted. The patient was seen by neurologist and recommended electroencephalogram, CTA of the head, both of which were normal, physical, occupational, speech therapist evaluations were obtained and patient was recommended skilled nursing facility. Initially, however, evaluation is pending, if patient does well with physical therapy, he  will likely be discharged home with home health services. The neurologist follow patient along and recommended magnesium intravenously for headache, which improved his headache significantly. Discussion by problem: #1. Hemiplegic migraine, patient's MRI showed no stroke, although focal stenosis or small aneurysm was of concern in the right MCA bifurcation region, patient was seen by neurologist, recommended electroencephalogram, CTA of the head, which were performed and were normal. PT, OT, SLP evaluation were done, patient was recommended skilled nursing facility placement, however, reevaluation is pending. Patient will likely be discharged home with home health services if is able to ambulate with physical therapist today. The patient was given intravenous magnesium for headaches and his headache subsided #2. Hypertriglyceridemia, TSH was normal, hemoglobin A1c was 6.3, pre-diabetes, dietary consultation was obtained. The patient was recommended to continue on diabetic diet #3. Essential hypertension, well-controlled #4. prediabetes, initiate patient on diabetic diet  DISCHARGE CONDITIONS:   Stable  CONSULTS OBTAINED:  Treatment Team:  Thana Farr, MD  DRUG ALLERGIES:  No Known Allergies  DISCHARGE MEDICATIONS:   Current Discharge Medication List    CONTINUE these medications which have NOT CHANGED   Details  albuterol (PROVENTIL HFA;VENTOLIN HFA) 108 (90 BASE) MCG/ACT inhaler Inhale 2 puffs into the lungs every 6 (six) hours as needed for wheezing or shortness of breath.    aspirin EC 81 MG tablet Take 1 tablet (81 mg total) by mouth daily. Qty: 30 tablet, Refills: 0    atorvastatin (LIPITOR) 40 MG tablet Take 1 tablet (40 mg total) by mouth daily at 6 PM. Qty: 30 tablet, Refills: 0    carbamazepine (TEGRETOL XR) 100 MG 12 hr tablet Take 100-200 mg by mouth  2 (two) times daily. 2 tab qam, 2 tabs qhs    esomeprazole (NEXIUM) 40 MG capsule Take 1 capsule (40 mg total) by mouth  2 (two) times daily before a meal. Qty: 60 capsule, Refills: 3    ferrous gluconate (FERGON) 324 MG tablet Take 324 mg by mouth every other day. At night    lamoTRIgine (LAMICTAL) 200 MG tablet Take 200 mg by mouth 2 (two) times daily.    Pirfenidone 267 MG CAPS Take 2 capsules by mouth 3 (three) times daily. Qty: 180 capsule, Refills: 11    Respiratory Therapy Supplies (FLUTTER) DEVI Use as directed Qty: 1 each, Refills: 0    Bioflavonoid Products (ESTER C PO) Take 1 tablet by mouth daily.    Cholecalciferol (VITAMIN D-3) 5000 UNITS TABS Take 1 tablet by mouth daily.    docusate sodium (COLACE) 100 MG capsule Take 100 mg by mouth at bedtime.    fluticasone (FLONASE) 50 MCG/ACT nasal spray Place 2 sprays into both nostrils daily.    Probiotic Product (PROBIOTIC DAILY PO) Take 1 tablet by mouth daily.      STOP taking these medications     HYDROcodone-acetaminophen (NORCO/VICODIN) 5-325 MG tablet          DISCHARGE INSTRUCTIONS:   The patient is to follow-up with primary care physician and primary neurologist as outpatient   If you experience worsening of your admission symptoms, develop shortness of breath, life threatening emergency, suicidal or homicidal thoughts you must seek medical attention immediately by calling 911 or calling your MD immediately  if symptoms less severe.  You Must read complete instructions/literature along with all the possible adverse reactions/side effects for all the Medicines you take and that have been prescribed to you. Take any new Medicines after you have completely understood and accept all the possible adverse reactions/side effects.   Please note  You were cared for by a hospitalist during your hospital stay. If you have any questions about your discharge medications or the care you received while you were in the hospital after you are discharged, you can call the unit and asked to speak with the hospitalist on call if the hospitalist  that took care of you is not available. Once you are discharged, your primary care physician will handle any further medical issues. Please note that NO REFILLS for any discharge medications will be authorized once you are discharged, as it is imperative that you return to your primary care physician (or establish a relationship with a primary care physician if you do not have one) for your aftercare needs so that they can reassess your need for medications and monitor your lab values.    Today   CHIEF COMPLAINT:  No chief complaint on file.   HISTORY OF PRESENT ILLNESS:  Kevin Boyd  is a 49 y.o. male with a known history of hemiplegic migraine, who is followed by Dr. Dr. Clelia Croft on outpatient basis, who presented to the hospital with complaints of dizziness, being off balance, having problems with speech and left-sided weakness. He also complained of headache. 8/10 by density. He had CT scan of the head as well as MRI of the brain, MRA, which were unremarkable, although right MCA bifurcation, abnormal appearance was noted. The patient was seen by neurologist and recommended electroencephalogram, CTA of the head, both of which were normal, physical, occupational, speech therapist evaluations were obtained and patient was recommended skilled nursing facility. Initially, however, evaluation is pending, if patient does well with physical therapy,  he will likely be discharged home with home health services. The neurologist follow patient along and recommended magnesium intravenously for headache, which improved his headache significantly. Discussion by problem: #1. Hemiplegic migraine, patient's MRI showed no stroke, although focal stenosis or small aneurysm was of concern in the right MCA bifurcation region, patient was seen by neurologist, recommended electroencephalogram, CTA of the head, which were performed and were normal. PT, OT, SLP evaluation were done, patient was recommended skilled nursing  facility placement, however, reevaluation is pending. Patient will likely be discharged home with home health services if is able to ambulate with physical therapist today. The patient was given intravenous magnesium for headaches and his headache subsided #2. Hypertriglyceridemia, TSH was normal, hemoglobin A1c was 6.3, pre-diabetes, dietary consultation was obtained. The patient was recommended to continue on diabetic diet #3. Essential hypertension, well-controlled #4. prediabetes, initiate patient on diabetic diet    VITAL SIGNS:  Blood pressure 105/76, pulse 66, temperature 98.5 F (36.9 C), temperature source Oral, resp. rate 17, height 6\' 2"  (1.88 m), weight 132.6 kg (292 lb 5 oz), SpO2 96 %.  I/O:   Intake/Output Summary (Last 24 hours) at 02/15/16 1240 Last data filed at 02/15/16 0900  Gross per 24 hour  Intake              480 ml  Output             1650 ml  Net            -1170 ml    PHYSICAL EXAMINATION:  GENERAL:  50 y.o.-year-old patient lying in the bed with no acute distress.  EYES: Pupils equal, round, reactive to light and accommodation. No scleral icterus. Extraocular muscles intact.  HEENT: Head atraumatic, normocephalic. Oropharynx and nasopharynx clear.  NECK:  Supple, no jugular venous distention. No thyroid enlargement, no tenderness.  LUNGS: Normal breath sounds bilaterally, no wheezing, rales,rhonchi or crepitation. No use of accessory muscles of respiration.  CARDIOVASCULAR: S1, S2 normal. No murmurs, rubs, or gallops.  ABDOMEN: Soft, non-tender, non-distended. Bowel sounds present. No organomegaly or mass.  EXTREMITIES: No pedal edema, cyanosis, or clubbing.  NEUROLOGIC: Cranial nerves II through XII are intact. Muscle strength 5/5 in all extremities. Sensation intact. Gait not checked.  PSYCHIATRIC: The patient is alert and oriented x 3.  SKIN: No obvious rash, lesion, or ulcer.   DATA REVIEW:   CBC  Recent Labs Lab 02/14/16 0443  WBC 6.4  HGB  13.6  HCT 39.5*  PLT 175    Chemistries   Recent Labs Lab 02/13/16 1619 02/14/16 0443  NA 137 139  K 3.8 4.2  CL 105 106  CO2 25 26  GLUCOSE 145* 113*  BUN 9 12  CREATININE 0.67 0.74  CALCIUM 9.0 8.6*  AST 33  --   ALT 38  --   ALKPHOS 85  --   BILITOT 0.4  --     Cardiac Enzymes  Recent Labs Lab 02/13/16 1619  TROPONINI <0.03    Microbiology Results  Results for orders placed or performed in visit on 01/06/13  Stool culture     Status: None   Collection Time: 01/06/13  6:30 AM  Result Value Ref Range Status   Micro Text Report   Final       ORGANISM 1                HEAVY GROWTH YEAST-ID CANDIDA ALBICANS   COMMENT  NO SALMONELLA OR SHIGELLA ISOLATED   COMMENT                   NO PATHOGENIC E.COLI DETECTED   COMMENT                   NO CAMPYLOBACTER ANTIGEN DETECTED   ANTIBIOTIC                    ORG#1                                               Clostridium Difficile by PCR     Status: None   Collection Time: 01/06/13  6:30 AM  Result Value Ref Range Status   Micro Text Report   Final       COMMENT                   NEGATIVE-CLOS.DIFFICILE TOXIN NOT DETECTED BY PCR   ANTIBIOTIC                                                        RADIOLOGY:  Ct Angio Head W Or Wo Contrast  Result Date: 02/14/2016 CLINICAL DATA:  Left hemi paresis. Abnormal right MCA on MRA. Rule out aneurysm. EXAM: CT ANGIOGRAPHY HEAD TECHNIQUE: Multidetector CT imaging of the head was performed using the standard protocol during bolus administration of intravenous contrast. Multiplanar CT image reconstructions and MIPs were obtained to evaluate the vascular anatomy. CONTRAST:  75 mL Isovue 370 IV COMPARISON:  MRI and MRA head 02/14/2016 FINDINGS: CTA HEAD Anterior circulation: Right cavernous carotid widely patent. Right anterior and middle cerebral arteries widely patent. Early bifurcation of the right middle cerebral artery. No aneurysm or stenosis in this  area as questioned on MRA. MRA finding may be due to a branch vessel. Left cavernous carotid widely patent with mild atherosclerotic calcification. Left anterior middle cerebral arteries patent without stenosis or aneurysm. Early bifurcation left middle cerebral artery. Posterior circulation: Both vertebral arteries patent to the basilar. PICA patent bilaterally. Basilar widely patent. Superior cerebellar and posterior cerebral arteries patent bilaterally. Fetal origin of the right posterior cerebral artery. Venous sinuses: Patent.  Right transverse sinus dominant. Anatomic variants: None Delayed phase: Normal enhancement on delayed imaging IMPRESSION: Negative CTA head.  Negative for aneurysm or intracranial stenosis. Electronically Signed   By: Marlan Palau M.D.   On: 02/14/2016 14:46   Mr Maxine Glenn Head Wo Contrast  Result Date: 02/14/2016 CLINICAL DATA:  Diabetes. Bipolar disorder. Acute onset of dizziness and balance disturbance yesterday. EXAM: MRI HEAD WITHOUT CONTRAST MRA HEAD WITHOUT CONTRAST TECHNIQUE: Multiplanar, multiecho pulse sequences of the brain and surrounding structures were obtained without intravenous contrast. Angiographic images of the head were obtained using MRA technique without contrast. COMPARISON:  CT 02/13/2016.  MRI evaluation 11/04/2014 FINDINGS: MRI HEAD FINDINGS Brain: The brain has normal appearance without evidence of malformation, atrophy, old or acute small or large vessel infarction, hemorrhage, hydrocephalus or extra-axial collection. No pituitary abnormality. Mega cisterna magna incidentally noted. Vascular: Major vessels at the base of the brain show flow. Skull and upper cervical spine: Normal Sinuses/Orbits: Clear/ normal. Other: None significant. MRA HEAD FINDINGS  Both internal carotid arteries are widely patent into the brain. No siphon stenosis. The anterior and middle cerebral vessels are patent. There is a somewhat irregular appearance at the MCA bifurcation region  on the right. I question if there could be a focal stenosis or a small aneurysm. This is not definite. CT angiography would be suggested to evaluate this area more precisely. This area did look normal on the examinations of 2016. Both vertebral arteries are widely patent to the basilar. No basilar stenosis. Posterior circulation branch vessels appear normal. IMPRESSION: Normal MRI of the brain. No major vessel occlusion. Question abnormal appearance at the right MCA bifurcation region raising the possibility of focal stenosis or small aneurysm. CT angiography suggested to evaluate this area with more clarity. Electronically Signed   By: Paulina FusiMark  Shogry M.D.   On: 02/14/2016 10:24   Mr Brain Wo Contrast  Result Date: 02/14/2016 CLINICAL DATA:  Diabetes. Bipolar disorder. Acute onset of dizziness and balance disturbance yesterday. EXAM: MRI HEAD WITHOUT CONTRAST MRA HEAD WITHOUT CONTRAST TECHNIQUE: Multiplanar, multiecho pulse sequences of the brain and surrounding structures were obtained without intravenous contrast. Angiographic images of the head were obtained using MRA technique without contrast. COMPARISON:  CT 02/13/2016.  MRI evaluation 11/04/2014 FINDINGS: MRI HEAD FINDINGS Brain: The brain has normal appearance without evidence of malformation, atrophy, old or acute small or large vessel infarction, hemorrhage, hydrocephalus or extra-axial collection. No pituitary abnormality. Mega cisterna magna incidentally noted. Vascular: Major vessels at the base of the brain show flow. Skull and upper cervical spine: Normal Sinuses/Orbits: Clear/ normal. Other: None significant. MRA HEAD FINDINGS Both internal carotid arteries are widely patent into the brain. No siphon stenosis. The anterior and middle cerebral vessels are patent. There is a somewhat irregular appearance at the MCA bifurcation region on the right. I question if there could be a focal stenosis or a small aneurysm. This is not definite. CT angiography  would be suggested to evaluate this area more precisely. This area did look normal on the examinations of 2016. Both vertebral arteries are widely patent to the basilar. No basilar stenosis. Posterior circulation branch vessels appear normal. IMPRESSION: Normal MRI of the brain. No major vessel occlusion. Question abnormal appearance at the right MCA bifurcation region raising the possibility of focal stenosis or small aneurysm. CT angiography suggested to evaluate this area with more clarity. Electronically Signed   By: Paulina FusiMark  Shogry M.D.   On: 02/14/2016 10:24   [place] Carotid Bilateral  Result Date: 02/14/2016 CLINICAL DATA:  CVA, TIA, syncopal episode, visual disturbance and hyperlipidemia. History of diabetes and smoking. EXAM: BILATERAL CAROTID DUPLEX ULTRASOUND TECHNIQUE: Wallace CullensGray scale imaging, color Doppler and duplex ultrasound were performed of bilateral carotid and vertebral arteries in the neck. COMPARISON:  Head CT - 02/13/2016 FINDINGS: Criteria: Quantification of carotid stenosis is based on velocity parameters that correlate the residual internal carotid diameter with NASCET-based stenosis levels, using the diameter of the distal internal carotid lumen as the denominator for stenosis measurement. The following velocity measurements were obtained: RIGHT ICA:  86/41 cm/sec CCA:  100/39 cm/sec SYSTOLIC ICA/CCA RATIO:  0.9 DIASTOLIC ICA/CCA RATIO:  1.1 ECA:  80 cm/sec LEFT ICA:  124/28 cm/sec CCA:  97/35 cm/sec SYSTOLIC ICA/CCA RATIO:  1.3 DIASTOLIC ICA/CCA RATIO:  0.8 ECA:  100 cm/sec RIGHT CAROTID ARTERY: There is a minimal amount of eccentric mixed echogenic plaque within the right carotid bulb (images 11 and 12), extending to involve the origin and proximal aspect the right internal carotid artery (image  19), not resulting in elevated peak systolic velocities within the interrogated course the right internal carotid artery to suggest a hemodynamically significant stenosis. RIGHT VERTEBRAL ARTERY:   Antegrade Flow LEFT CAROTID ARTERY: There is a minimal amount of eccentric hypoechoic plaque involving the proximal aspect of the left common carotid artery (image 31). There is a minimal amount of intimal wall thickening involving the proximal aspect of the left internal carotid artery (image 47), not resulting in elevated peak systolic velocities within the interrogated course the left internal carotid artery to suggest a hemodynamically significant stenosis. LEFT VERTEBRAL ARTERY:  Antegrade Flow IMPRESSION: Minimal amount of bilateral atherosclerotic plaque, right greater than left, not resulting in a hemodynamically significant stenosis within either internal carotid artery. Electronically Signed   By: Simonne ComeJohn  Watts M.D.   On: 02/14/2016 10:11   Ct Head Code Stroke W/o Cm  Result Date: 02/13/2016 CLINICAL DATA:  Code stroke. Acute onset of dizziness, slurred speech, and headache. EXAM: CT HEAD WITHOUT CONTRAST TECHNIQUE: Contiguous axial images were obtained from the base of the skull through the vertex without intravenous contrast. COMPARISON:  06/19/2015 FINDINGS: Brain: There is no evidence of acute cortical infarct, intracranial hemorrhage, mass, midline shift, or extra-axial fluid collection. The ventricles and sulci are normal. Mild enlargement of the cisterna magna is again noted, a normal variant. Vascular: No hyperdense vessel. Minimal carotid siphon calcified plaque. Skull: No fracture or focal osseous lesion. Sinuses/Orbits: Visualized paranasal sinuses and mastoid air cells are clear. Orbits are unremarkable. Other: None. ASPECTS [facility]([place] Stroke Program Early CT Score) - Ganglionic level infarction (caudate, lentiform nuclei, internal capsule, insula, M1-M3 cortex): 7 - Supraganglionic infarction (M4-M6 cortex): 3 Total score (0-10 with 10 being normal): 10 IMPRESSION: 1. No evidence of acute intracranial abnormality. 2. ASPECTS is 10. These results were called by telephone at the time of  interpretation on 02/13/2016 at 4:23 pm to Dr. Roxan Hockeyobinson, who verbally acknowledged these results. Electronically Signed   By: Sebastian AcheAllen  Grady M.D.   On: 02/13/2016 16:24    EKG:   Orders placed or performed during the hospital encounter of 02/13/16  . EKG 12-Lead  . EKG 12-Lead      Management plans discussed with the patient, family and they are in agreement.  CODE STATUS:     Code Status Orders        Start     Ordered   02/13/16 1849  Full code  Continuous     02/13/16 1848    Code Status History    Date Active Date Inactive Code Status Order ID Comments User Context   11/03/2014 11:01 PM 11/07/2014  2:11 AM Full Code [id number]  Oralia Manisavid Willis, MD Inpatient      TOTAL TIME TAKING CARE OF THIS PATIENT: 40 minutes.    Katharina CaperVAICKUTE,Adilynne Fitzwater M.D on 02/15/2016 at 12:40 PM  Between 7am to 6pm - Pager - [phone]  After 6pm go to [email or web address] - password [email or web address] [facility]  [facility]  Office  [phone]  CC: Primary care physician; Marisue IvanLINTHAVONG, KANHKA, MD

## 2016-02-15 NOTE — Discharge Instructions (Signed)
Transient Ischemic Attack °A transient ischemic attack (TIA) is a "warning stroke" that causes stroke-like symptoms. A TIA does not cause lasting damage to the brain. The symptoms of a TIA can happen fast and do not last long. It is important to know the symptoms of a TIA and what to do. This can help prevent stroke or death. °Follow these instructions at home: °· Take medicines only as told by your doctor. Make sure you understand all of the instructions. °· You may need to take aspirin or warfarin medicine. Warfarin needs to be taken exactly as told. °¨ Taking too much or too little warfarin is dangerous. Blood tests must be done as often as told by your doctor. A PT blood test measures how long it takes for blood to clot. Your PT is used to calculate another value called an INR. Your PT and INR help your doctor adjust your warfarin dosage. He or she will make sure you are taking the right amount. °¨ Food can cause problems with warfarin and affect the results of your blood tests. This is true for foods high in vitamin K. Eat the same amount of foods high in vitamin K each day. Foods high in vitamin K include spinach, kale, broccoli, cabbage, collard and turnip greens, Brussels sprouts, peas, cauliflower, seaweed, and parsley. Other foods high in vitamin K include beef and pork liver, green tea, and soybean oil. Eat the same amount of foods high in vitamin K each day. Avoid big changes in your diet. Tell your doctor before changing your diet. Talk to a food specialist (dietitian) if you have questions. °¨ Many medicines can cause problems with warfarin and affect your PT and INR. Tell your doctor about all medicines you take. This includes vitamins and dietary pills (supplements). Do not take or stop taking any prescribed or over-the-counter medicines unless your doctor tells you to. °¨ Warfarin can cause more bruising or bleeding. Hold pressure over any cuts for longer than normal. Talk to your doctor about other  side effects of warfarin. °¨ Avoid sports or activities that may cause injury or bleeding. °¨ Be careful when you shave, floss, or use sharp objects. °¨ Avoid or drink very little alcohol while taking warfarin. Tell your doctor if you change how much alcohol you drink. °¨ Tell your dentist and other doctors that you take warfarin before any procedures. °· Follow your diet program as told, if you are given one. °· Keep a healthy weight. °· Stay active. Try to get at least 30 minutes of activity on all or most days. °· Do not use any tobacco products, including cigarettes, chewing tobacco, or electronic cigarettes. If you need help quitting, ask your doctor. °· Limit alcohol intake to no more than 1 drink per day for nonpregnant women and 2 drinks per day for men. One drink equals 12 ounces of beer, 5 ounces of wine, or 1½ ounces of hard liquor. °· Do not abuse drugs. °· Keep your home safe so you do not fall. You can do this by: °¨ Putting grab bars in the bedroom and bathroom. °¨ Raising toilet seats. °¨ Putting a seat in the shower. °· Keep all follow-up visits as told by your doctor. This is important. °Contact a doctor if: °· Your personality changes. °· You have trouble swallowing. °· You have double vision. °· You are dizzy. °· You have a fever. °Get help right away if: °These symptoms may be an emergency. Do not wait to see if   the symptoms will go away. Get medical help right away. Call your local emergency services (911 in the [place] Do not drive yourself to the hospital. °· You have sudden weakness or lose feeling (go numb), especially on one side of the body. This can affect your: °¨ Face. °¨ Arm. °¨ Leg. °· You have sudden trouble walking. °· You have sudden trouble moving your arms or legs. °· You have sudden confusion. °· You have trouble talking. °· You have trouble understanding. °· You have sudden trouble seeing in one or both eyes. °· You lose your balance. °· Your movements are not smooth. °· You  have a sudden, very bad headache with no known cause. °· You have new chest pain. °· Your heartbeat is unsteady. °· You are partly or totally unaware of what is going on around you. °This information is not intended to replace advice given to you by your health care provider. Make sure you discuss any questions you have with your health care provider. °Document Released: 12/27/2007 Document Revised: 11/21/2015 Document Reviewed: 06/24/2013 °Elsevier Interactive Patient Education © 2017 [employer] ° °

## 2016-02-15 NOTE — Progress Notes (Signed)
Pt left at this time via w/c to home w/o c/o. Alert. No resp distress.  Instructions discussed with pt.  meds discussed. Diet activity and f/u discussed.  Verbalized understanding.  P.t. amb  Earlier. See note. Sl x 2 d/cd w/o  Problems  Per protocol.

## 2016-02-15 NOTE — Plan of Care (Signed)
Problem: Food- and Nutrition-Related Knowledge Deficit (NB-1.1) Goal: Nutrition education Formal process to instruct or train a patient/client in a skill or to impart knowledge to help patients/clients voluntarily manage or modify food choices and eating behavior to maintain or improve health. Outcome: Completed/Met Date Met: 02/15/16  RD consulted for nutrition education regarding diabetes.   Met with patient and wife in room today. Patient reports eating one large meal a day, mainly at dinner. Patient allergic to aspartame and tries to avoid all artificial sweeteners. Patient drinks mainly [facility], Dr. Malachi Bonds, Sweet tea, and some water. Loves sweets. Suggested to patient to try and avoid sugary drinks. Made recommendation for patient to try and gradually reduce amount of sugar in his tea. Discouraged skipping meals and encouraged daily activity.   Lab Results  Component Value Date   HGBA1C 6.3 (H) 02/13/2016    RD provided "Carbohydrate Counting for People with Diabetes" handout from the Academy of Nutrition and Dietetics. Discussed different food groups and their effects on blood sugar, emphasizing carbohydrate-containing foods. Provided list of carbohydrates and recommended serving sizes of common foods.  Discussed importance of controlled and consistent carbohydrate intake throughout the day. Provided examples of ways to balance meals/snacks and encouraged intake of high-fiber, whole grain complex carbohydrates. Teach back method used.  Expect fair compliance.  Body mass index is 37.53 kg/m. Pt meets criteria for class II obesity based on current BMI.  Current diet order is low sodium/heart healthy, patient is consuming approximately 100% of meals at this time. Labs and medications reviewed. No further nutrition interventions warranted at this time. RD contact information provided. If additional nutrition issues arise, please re-consult RD.  Koleen Distance, Rd, LDN

## 2016-02-15 NOTE — Progress Notes (Signed)
Subjective: Patient reports headache improved at 3/10 today.  Continued left sided weakness.  Speech improved but not at baseline.    Objective: Current vital signs: BP 105/76 (BP Location: Right Arm)   Pulse 66   Temp 98.5 F (36.9 C) (Oral)   Resp 17   Ht 6\' 2"  (1.88 m)   Wt 132.6 kg (292 lb 5 oz)   SpO2 96%   BMI 37.53 kg/m  Vital signs in last 24 hours: Temp:  [98 F (36.7 C)-98.5 F (36.9 C)] 98.5 F (36.9 C) (11/15 0700) Pulse Rate:  [63-68] 66 (11/15 0700) Resp:  [17-20] 17 (11/15 0700) BP: (105-140)/(75-85) 105/76 (11/15 0700) SpO2:  [95 %-97 %] 96 % (11/15 0700)  Intake/Output from previous day: 11/14 0701 - 11/15 0700 In: 240 [P.O.:240] Out: 1650 [Urine:1650] Intake/Output this shift: Total I/O In: 240 [P.O.:240] Out: -  Nutritional status: Diet Carb Modified Fluid consistency: Thin; Room service appropriate? Yes  Neurologic Exam: Mental Status: Alert, oriented, thought content appropriate.  Speech remains stuttering but improved.  Able to follow 3 step commands without difficulty. Cranial Nerves: II: Discs flat bilaterally; Visual fields grossly normal, pupils equal, round, reactive to light and accommodation III,IV, VI: ptosis not present, extra-ocular motions intact bilaterally V,VII: smile symmetric, facial light touch sensation decreased on the left VIII: hearing normal bilaterally IX,X: gag reflex present XI: bilateral shoulder shrug XII: midline tongue extension Motor: Right 5/5.  Left with drift but no pronation.  LUE 4/5.  LLE strength of 2/5 with no reciprocal downward movement of the RLE.   Sensory: Pinprick and light touch decreased on the left   Lab Results: Basic Metabolic Panel:  Recent Labs Lab 02/13/16 1619 02/14/16 0443  NA 137 139  K 3.8 4.2  CL 105 106  CO2 25 26  GLUCOSE 145* 113*  BUN 9 12  CREATININE 0.67 0.74  CALCIUM 9.0 8.6*    Liver Function Tests:  Recent Labs Lab 02/13/16 1619  AST 33  ALT 38  ALKPHOS 85   BILITOT 0.4  PROT 7.6  ALBUMIN 4.1   No results for input(s): LIPASE, AMYLASE in the last 168 hours. No results for input(s): AMMONIA in the last 168 hours.  CBC:  Recent Labs Lab 02/13/16 1619 02/14/16 0443  WBC 7.4 6.4  NEUTROABS 4.1  --   HGB 14.4 13.6  HCT 40.9 39.5*  MCV 89.6 91.1  PLT 194 175    Cardiac Enzymes:  Recent Labs Lab 02/13/16 1619  TROPONINI <0.03    Lipid Panel:  Recent Labs Lab 02/13/16 1619  CHOL 188  TRIG 276*  HDL 34*  CHOLHDL 5.5  VLDL 55*  LDLCALC 99    CBG: No results for input(s): GLUCAP in the last 168 hours.  Microbiology: Results for orders placed or performed in visit on 01/06/13  Stool culture     Status: None   Collection Time: 01/06/13  6:30 AM  Result Value Ref Range Status   Micro Text Report   Final       ORGANISM 1                HEAVY GROWTH YEAST-ID CANDIDA ALBICANS   COMMENT                   NO SALMONELLA OR SHIGELLA ISOLATED   COMMENT                   NO PATHOGENIC E.COLI DETECTED   COMMENT  NO CAMPYLOBACTER ANTIGEN DETECTED   ANTIBIOTIC                    ORG#1                                               Clostridium Difficile by PCR     Status: None   Collection Time: 01/06/13  6:30 AM  Result Value Ref Range Status   Micro Text Report   Final       COMMENT                   NEGATIVE-CLOS.DIFFICILE TOXIN NOT DETECTED BY PCR   ANTIBIOTIC                                                        Coagulation Studies:  Recent Labs  02/13/16 1619  LABPROT 12.5  INR 0.93    Imaging: Ct Angio Head W Or Wo Contrast  Result Date: 02/14/2016 CLINICAL DATA:  Left hemi paresis. Abnormal right MCA on MRA. Rule out aneurysm. EXAM: CT ANGIOGRAPHY HEAD TECHNIQUE: Multidetector CT imaging of the head was performed using the standard protocol during bolus administration of intravenous contrast. Multiplanar CT image reconstructions and MIPs were obtained to evaluate the vascular anatomy.  CONTRAST:  75 mL Isovue 370 IV COMPARISON:  MRI and MRA head 02/14/2016 FINDINGS: CTA HEAD Anterior circulation: Right cavernous carotid widely patent. Right anterior and middle cerebral arteries widely patent. Early bifurcation of the right middle cerebral artery. No aneurysm or stenosis in this area as questioned on MRA. MRA finding may be due to a branch vessel. Left cavernous carotid widely patent with mild atherosclerotic calcification. Left anterior middle cerebral arteries patent without stenosis or aneurysm. Early bifurcation left middle cerebral artery. Posterior circulation: Both vertebral arteries patent to the basilar. PICA patent bilaterally. Basilar widely patent. Superior cerebellar and posterior cerebral arteries patent bilaterally. Fetal origin of the right posterior cerebral artery. Venous sinuses: Patent.  Right transverse sinus dominant. Anatomic variants: None Delayed phase: Normal enhancement on delayed imaging IMPRESSION: Negative CTA head.  Negative for aneurysm or intracranial stenosis. Electronically Signed   By: Marlan Palauharles  Clark M.D.   On: 02/14/2016 14:46   Mr Maxine GlennMra Head Wo Contrast  Result Date: 02/14/2016 CLINICAL DATA:  Diabetes. Bipolar disorder. Acute onset of dizziness and balance disturbance yesterday. EXAM: MRI HEAD WITHOUT CONTRAST MRA HEAD WITHOUT CONTRAST TECHNIQUE: Multiplanar, multiecho pulse sequences of the brain and surrounding structures were obtained without intravenous contrast. Angiographic images of the head were obtained using MRA technique without contrast. COMPARISON:  CT 02/13/2016.  MRI evaluation 11/04/2014 FINDINGS: MRI HEAD FINDINGS Brain: The brain has normal appearance without evidence of malformation, atrophy, old or acute small or large vessel infarction, hemorrhage, hydrocephalus or extra-axial collection. No pituitary abnormality. Mega cisterna magna incidentally noted. Vascular: Major vessels at the base of the brain show flow. Skull and upper cervical  spine: Normal Sinuses/Orbits: Clear/ normal. Other: None significant. MRA HEAD FINDINGS Both internal carotid arteries are widely patent into the brain. No siphon stenosis. The anterior and middle cerebral vessels are patent. There is a somewhat irregular appearance at the MCA bifurcation region on the  right. I question if there could be a focal stenosis or a small aneurysm. This is not definite. CT angiography would be suggested to evaluate this area more precisely. This area did look normal on the examinations of 2016. Both vertebral arteries are widely patent to the basilar. No basilar stenosis. Posterior circulation branch vessels appear normal. IMPRESSION: Normal MRI of the brain. No major vessel occlusion. Question abnormal appearance at the right MCA bifurcation region raising the possibility of focal stenosis or small aneurysm. CT angiography suggested to evaluate this area with more clarity. Electronically Signed   By: Paulina FusiMark  Shogry M.D.   On: 02/14/2016 10:24   Mr Brain Wo Contrast  Result Date: 02/14/2016 CLINICAL DATA:  Diabetes. Bipolar disorder. Acute onset of dizziness and balance disturbance yesterday. EXAM: MRI HEAD WITHOUT CONTRAST MRA HEAD WITHOUT CONTRAST TECHNIQUE: Multiplanar, multiecho pulse sequences of the brain and surrounding structures were obtained without intravenous contrast. Angiographic images of the head were obtained using MRA technique without contrast. COMPARISON:  CT 02/13/2016.  MRI evaluation 11/04/2014 FINDINGS: MRI HEAD FINDINGS Brain: The brain has normal appearance without evidence of malformation, atrophy, old or acute small or large vessel infarction, hemorrhage, hydrocephalus or extra-axial collection. No pituitary abnormality. Mega cisterna magna incidentally noted. Vascular: Major vessels at the base of the brain show flow. Skull and upper cervical spine: Normal Sinuses/Orbits: Clear/ normal. Other: None significant. MRA HEAD FINDINGS Both internal carotid  arteries are widely patent into the brain. No siphon stenosis. The anterior and middle cerebral vessels are patent. There is a somewhat irregular appearance at the MCA bifurcation region on the right. I question if there could be a focal stenosis or a small aneurysm. This is not definite. CT angiography would be suggested to evaluate this area more precisely. This area did look normal on the examinations of 2016. Both vertebral arteries are widely patent to the basilar. No basilar stenosis. Posterior circulation branch vessels appear normal. IMPRESSION: Normal MRI of the brain. No major vessel occlusion. Question abnormal appearance at the right MCA bifurcation region raising the possibility of focal stenosis or small aneurysm. CT angiography suggested to evaluate this area with more clarity. Electronically Signed   By: Paulina FusiMark  Shogry M.D.   On: 02/14/2016 10:24   [place] Carotid Bilateral  Result Date: 02/14/2016 CLINICAL DATA:  CVA, TIA, syncopal episode, visual disturbance and hyperlipidemia. History of diabetes and smoking. EXAM: BILATERAL CAROTID DUPLEX ULTRASOUND TECHNIQUE: Wallace CullensGray scale imaging, color Doppler and duplex ultrasound were performed of bilateral carotid and vertebral arteries in the neck. COMPARISON:  Head CT - 02/13/2016 FINDINGS: Criteria: Quantification of carotid stenosis is based on velocity parameters that correlate the residual internal carotid diameter with NASCET-based stenosis levels, using the diameter of the distal internal carotid lumen as the denominator for stenosis measurement. The following velocity measurements were obtained: RIGHT ICA:  86/41 cm/sec CCA:  100/39 cm/sec SYSTOLIC ICA/CCA RATIO:  0.9 DIASTOLIC ICA/CCA RATIO:  1.1 ECA:  80 cm/sec LEFT ICA:  124/28 cm/sec CCA:  97/35 cm/sec SYSTOLIC ICA/CCA RATIO:  1.3 DIASTOLIC ICA/CCA RATIO:  0.8 ECA:  100 cm/sec RIGHT CAROTID ARTERY: There is a minimal amount of eccentric mixed echogenic plaque within the right carotid bulb (images 11  and 12), extending to involve the origin and proximal aspect the right internal carotid artery (image 19), not resulting in elevated peak systolic velocities within the interrogated course the right internal carotid artery to suggest a hemodynamically significant stenosis. RIGHT VERTEBRAL ARTERY:  Antegrade Flow LEFT CAROTID ARTERY: There is  a minimal amount of eccentric hypoechoic plaque involving the proximal aspect of the left common carotid artery (image 31). There is a minimal amount of intimal wall thickening involving the proximal aspect of the left internal carotid artery (image 47), not resulting in elevated peak systolic velocities within the interrogated course the left internal carotid artery to suggest a hemodynamically significant stenosis. LEFT VERTEBRAL ARTERY:  Antegrade Flow IMPRESSION: Minimal amount of bilateral atherosclerotic plaque, right greater than left, not resulting in a hemodynamically significant stenosis within either internal carotid artery. Electronically Signed   By: Simonne Come M.D.   On: 02/14/2016 10:11   Ct Head Code Stroke W/o Cm  Result Date: 02/13/2016 CLINICAL DATA:  Code stroke. Acute onset of dizziness, slurred speech, and headache. EXAM: CT HEAD WITHOUT CONTRAST TECHNIQUE: Contiguous axial images were obtained from the base of the skull through the vertex without intravenous contrast. COMPARISON:  06/19/2015 FINDINGS: Brain: There is no evidence of acute cortical infarct, intracranial hemorrhage, mass, midline shift, or extra-axial fluid collection. The ventricles and sulci are normal. Mild enlargement of the cisterna magna is again noted, a normal variant. Vascular: No hyperdense vessel. Minimal carotid siphon calcified plaque. Skull: No fracture or focal osseous lesion. Sinuses/Orbits: Visualized paranasal sinuses and mastoid air cells are clear. Orbits are unremarkable. Other: None. ASPECTS [facility] Stroke Program Early CT Score) - Ganglionic level infarction  (caudate, lentiform nuclei, internal capsule, insula, M1-M3 cortex): 7 - Supraganglionic infarction (M4-M6 cortex): 3 Total score (0-10 with 10 being normal): 10 IMPRESSION: 1. No evidence of acute intracranial abnormality. 2. ASPECTS is 10. These results were called by telephone at the time of interpretation on 02/13/2016 at 4:23 pm to Dr. Roxan Hockey, who verbally acknowledged these results. Electronically Signed   By: Sebastian Ache M.D.   On: 02/13/2016 16:24    Medications:  I have reviewed the patient's current medications. Scheduled: . acidophilus   Oral Daily  . aspirin EC  325 mg Oral Daily  . atorvastatin  40 mg Oral q1800  . carbamazepine  200 mg Oral BID  . cholecalciferol  5,000 Units Oral Daily  . docusate sodium  100 mg Oral QHS  . enoxaparin (LOVENOX) injection  40 mg Subcutaneous Q24H  . ferrous gluconate  324 mg Oral QODAY  . fluticasone  2 spray Each Nare Daily  . lamoTRIgine  200 mg Oral BID  . pantoprazole  40 mg Oral BID  . Pirfenidone  2 capsule Oral TID  . sodium chloride flush  3 mL Intravenous Q12H    Assessment/Plan: Patient headache improved.  Tolerated magnesium infusion.  Tegretol level 5.5.  Continues to have left sided weakness that continues to have many functional features.  EEG unremarkable.    Recommendations: 1. No further neurologic intervention is recommended at this time.  If further questions arise, please call or page at that time.  Thank you for allowing neurology to participate in the care of this patient.  Patient to continue with therapy on an outpatient basis.  To continue outpatient follow up with neurology.    LOS: 0 days   Thana Farr, MD Neurology [phone] 02/15/2016  10:34 AM

## 2016-02-15 NOTE — Care Management (Signed)
Admitted to [facility] with the diagnosis of TIA. Lives with wife, Kevin Boyd, [phone]). Last seen Dr. Burnadette PopLinthavong couple of months ago. No home Health. No skilled facility. No home oxygen. Rolling walker in the home, if needed. CPAP in the home. States he needs new cords. States has called sleep study facility many times and has left voice mail. No one has called back, No falls. Decreased appetite. Prescriptions are prime mail or [employer].  Physical therapy evaluation completed. Recommending skilled nursing facility. Kevin Boyd refusing this option. Kevin Boyd, physical therapist in for re-evaluation. Refusing home with home health and therapy. Requesting bath bench. Kevin GottronJason Boyd, [facility] representative updated.  Discharge to home today per Dr, Winona LegatoVaickute. Kevin GreetBrenda S Niketa Turner RN MSN CCM Care Management [phone]

## 2016-02-15 NOTE — Progress Notes (Signed)
Physical Therapy Treatment Patient Details Name: Kevin Boyd MRN: [id number] DOB: May 18, 1965 Today's Date: 02/15/2016    History of Present Illness Pt. is a 50 y.o. male who was admitted to [facility] for further workup of a TIA.    PT Comments    Pt agreeable to PT. Pt wishes to go home versus skilled nursing facility. Pt/spouse note a similar episode happened 14 months ago, and pt managed well at home. Pt denies pain. Left upper extremity and lower extremity weakness persists, but feels mildly improved according to pt. Pt able to demonstrate bed mobility with Mod I and sit to/from stand transfers with Min guard with cues. Pt has progressed ambulation significantly from last session; however, quality continues impaired due to Left lower extremity weakness especially foot drop. Pt is able to demonstrate improved left foot clearance on second half of walk with increased left hip flexion. Pt was unable to flex hip actively initially. Pt ambulates 160 ft and negotiates up/down 2 steps consecutively without seated/stand rest, but some moderate fatigue. Pt educated on safety versus size/speed of stepping with ambulation and stairs. Discussed any obstacles at home; tub/shower is step in, so tub bench recommended and discussed with CM. Currently being ordered for pt. Recommend discharge home with intermittent assist and home health. CM aware.   Follow Up Recommendations  Home health PT     Equipment Recommendations  Other (comment) (tub bench)    Recommendations for Other Services       Precautions / Restrictions Precautions Precautions: Fall Restrictions Weight Bearing Restrictions: No    Mobility  Bed Mobility Overal bed mobility: Modified Independent Bed Mobility: Supine to Sit;Sit to Supine     Supine to sit: Modified independent (Device/Increase time)     General bed mobility comments: Mild increased time/effort; use of rails  Transfers Overall transfer level: Needs  assistance Equipment used: Rolling walker (2 wheeled) Transfers: Sit to/from Stand Sit to Stand: Min guard         General transfer comment: Cues for safe hand placement with stand  Ambulation/Gait Ambulation/Gait assistance: Min guard Ambulation Distance (Feet): 160 Feet Assistive device: Rolling walker (2 wheeled) Gait Pattern/deviations: Step-through pattern;Decreased step length - left;Decreased weight shift to left;Decreased dorsiflexion - left (partial step through) Gait velocity: reduced Gait velocity interpretation: Below normal speed for age/gender General Gait Details: Pt initially drags LLE, but improves on second half of walk due to increased L hip flexion (continues with L foot drop). Heavy lean on rw. Tends to try and ambulate faster with larger steps causing safety concerns with occasional mild L knee unsteadiness or missteps. No overt LOB; pt able to self correct.    Stairs Stairs: Yes   Stair Management: One rail Right;Step to pattern;Forwards (LUE on therapist's shoulder) Number of Stairs: 2 General stair comments: Good seqeunce; maintains safe speed and control  Wheelchair Mobility    Modified Rankin (Stroke Patients Only)       Balance Overall balance assessment: Needs assistance Sitting-balance support: Feet supported Sitting balance-Leahy Scale: Good     Standing balance support: Bilateral upper extremity supported Standing balance-Leahy Scale: Fair                      Cognition Arousal/Alertness: Awake/alert Behavior During Therapy: WFL for tasks assessed/performed Overall Cognitive Status: Within Functional Limits for tasks assessed                      Exercises General Exercises - Lower Extremity  Ankle Circles/Pumps: AROM;Both;10 reps;Supine (very limited on L) Quad Sets: Strengthening;Both;10 reps;Supine;Standing (10 in each position) Hip Flexion/Marching: Other (comment) (Attempted in stand prior to amb; minimal.  Improves during gt)    General Comments        Pertinent Vitals/Pain Pain Assessment: No/denies pain    Home Living                      Prior Function            PT Goals (current goals can now be found in the care plan section) Progress towards PT goals: Progressing toward goals    Frequency    7X/week      PT Plan Discharge plan needs to be updated    Co-evaluation             End of Session Equipment Utilized During Treatment: Gait belt Activity Tolerance: Patient tolerated treatment well Patient left: in bed;with family/visitor present;Other (comment) (sitting edge of bed)     Time: [id number] PT Time Calculation (min) (ACUTE ONLY): 23 min  Charges:  $Gait Training: 23-37 mins                    G Codes:      Scot DockHeidi E Barnes, PTA 02/15/2016, 2:47 PM

## 2016-02-29 ENCOUNTER — Telehealth: Payer: Self-pay | Admitting: Pulmonary Disease

## 2016-02-29 MED ORDER — ESOMEPRAZOLE MAGNESIUM 40 MG PO CPDR: 40.0000 mg | DELAYED_RELEASE_CAPSULE | Freq: Two times a day (BID) | ORAL | 3 refills | Status: DC

## 2016-02-29 NOTE — Telephone Encounter (Signed)
Pharmacy states pt is requesting nexium rx be sent to [facility] [address] in [place].  This has been sent.  Nothing further needed.

## 2016-04-17 ENCOUNTER — Ambulatory Visit: Payer: BLUE CROSS/BLUE SHIELD | Admitting: Pulmonary Disease

## 2016-04-26 ENCOUNTER — Other Ambulatory Visit: Payer: Self-pay

## 2016-04-26 MED ORDER — PIRFENIDONE 267 MG PO CAPS: 2.0000 | ORAL_CAPSULE | Freq: Three times a day (TID) | ORAL | 11 refills | Status: DC

## 2016-05-04 ENCOUNTER — Other Ambulatory Visit (INDEPENDENT_AMBULATORY_CARE_PROVIDER_SITE_OTHER): Payer: BLUE CROSS/BLUE SHIELD

## 2016-05-04 ENCOUNTER — Encounter: Payer: Self-pay | Admitting: Pulmonary Disease

## 2016-05-04 ENCOUNTER — Ambulatory Visit (INDEPENDENT_AMBULATORY_CARE_PROVIDER_SITE_OTHER): Payer: BLUE CROSS/BLUE SHIELD | Admitting: Pulmonary Disease

## 2016-05-04 VITALS — BP 134/72 | HR 64 | Ht 74.0 in | Wt 295.0 lb

## 2016-05-04 DIAGNOSIS — G4733 Obstructive sleep apnea (adult) (pediatric): Secondary | ICD-10-CM | POA: Diagnosis not present

## 2016-05-04 DIAGNOSIS — J84112 Idiopathic pulmonary fibrosis: Secondary | ICD-10-CM | POA: Diagnosis not present

## 2016-05-04 DIAGNOSIS — R892 Abnormal level of other drugs, medicaments and biological substances in specimens from other organs, systems and tissues: Secondary | ICD-10-CM

## 2016-05-04 LAB — HEPATIC FUNCTION PANEL
ALT: 33 U/L (ref 0–53)
AST: 24 U/L (ref 0–37)
Albumin: 4.6 g/dL (ref 3.5–5.2)
Alkaline Phosphatase: 74 U/L (ref 39–117)
BILIRUBIN DIRECT: 0.1 mg/dL (ref 0.0–0.3)
BILIRUBIN TOTAL: 0.5 mg/dL (ref 0.2–1.2)
Total Protein: 7.9 g/dL (ref 6.0–8.3)

## 2016-05-04 NOTE — Patient Instructions (Signed)
For your IPF: Take Esbriet 2 pills 3 times a day We will collect liver function testing today to monitor for drug toxicity and call you with those results We will arrange a lung function test in April as well as a 6 minute walk test to monitor for disease activity We may need to consider referral to the [facility] transplant center today Stay as active as possible and exercise regularly  For your acid reflux: Keep taking the Nexium twice a day Follow the acid reflux lifestyle modification sheet we gave you  For your cough: Take tessalon as needed for cough  We will see you back in April

## 2016-05-04 NOTE — Progress Notes (Signed)
Subjective:    Patient ID: Kevin Boyd, male    DOB: January 17, 1966, 51 y.o.   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 He has a family history significant for pulmonary fibrosis in that his mother died and he has a brother with the disease. Serology panel in 2015 was negative for connective tissue disease.  Diagnosed with IPF.  09/2002 [place] CXR showed infiltrates transiently that cleared on a follow up film one month later 01/2014 ANA, RF, SCL-70, SSA/SSB, Aldolase, Anti-Jo-1, centromere all neg August 2015 CT chest> There is groundglass opacification, scattered intralobular septal thickening, traction bronchiectasis and peripheral based honeycombing. The majority of the interstitial changes are peripheral based. There does not appear to be a craniocaudal gradient 12/28/2013 open lung biopsy: pulmonary fibrosis, severe, 1 sections show honeycombing, fibroblastic foci and overall variegated appearance with subpleural accentuation fibrosis, most consistent with usual interstitial pneumonitis. Granulomas are not seen. 03/22/2014 PFT> Ratio 82%, FEV1 2.91L (74% pred, 8% pred), TLCO 4.67L (62% pred), DLCO 35.4 (45% pred) 04/2014 1660 feet, O2 98% RA July 2016 pulmonary function testing> ratio 72%, FEV1 2.44 L (54% Pred), forced vital capacity 3.39 L (58% predicted), total lung capacity 5.48 L (70% predicted), DLCO 22.72 (60% predicted) . July 2016 6 minute walk> 372 m) 1220 feet) O2 sat 96% October 2016 pulmonary function testing ratio 88%, FEV1 3.47 L (77% predicted), total lung capacity 5.38 L (69% productive) sees, DLCO 20.55 (54% predicted) October 2016 6 minute walk test 479 m, O2 saturation 97%. 05/2015 CT sinus > normal October 2016 6 minute walk test 479 m, O2 saturation 97%. October 2017 pulmonary function testing ratio 89% FVC 3.52 L 61% predicted total lung capacity 4.66 L 59% predicted DLCO 13.31 45%  predicted   HPI Chief Complaint  Patient presents with  . Follow-up    pt c/o stable sob with exertion, worsening nonprod cough with exertion, qhs.     Devereaux stopped taking his Esbriet after the last visit.  He says he was under the impression from our last visit that he should not be taking it.  Paradoxically he tells me in the same visit that he is still taking it twice a day.    He still has shortness of breath.  He feels like it is not worse compared to before.  It is basically the same but not better.  Any walking up a hill makes it worse, carrying heavy objects makes it worse.    He says the cough will not go away.  It is severe in the eveinngs.  It is non-productive.  It is sometimes uncontrollably bad in the daytime.  He will have severe coughing spells for 5 minutes at a time during the daytime.  He says that he has sinus problems after the cough but not before it.    He is still taking Nexium and he denies heartburn symptoms.      Past Medical History:  Diagnosis Date  . Asthma   . Bipolar 1 disorder (HCC)   . Borderline diabetes mellitus   . C. difficile diarrhea   . COPD (chronic obstructive pulmonary disease) (HCC)   . GERD (gastroesophageal reflux disease)   . Interstitial lung disease (HCC)   . Scoliosis   . Seasonal allergies   . Sleep apnea       Review of Systems  Constitutional: Negative for chills and fever.  HENT: Negative for postnasal drip, rhinorrhea  and sinus pressure.   Respiratory: Positive for shortness of breath. Negative for wheezing.   Cardiovascular: Negative for chest pain, palpitations and leg swelling.  Gastrointestinal: Negative for diarrhea.       Objective:   Physical Exam Vitals:   05/04/16 1055  BP: 134/72  Pulse: 64  SpO2: 96%  Weight: 295 lb (133.8 kg)  Height: 6\' 2"  (1.88 m)  RA   Gen: well appearing HENT: OP clear, TM's clear, neck supple PULM: Crackles bases bilaterally, normal percussion CV: RRR, no mgr, trace  edema GI: BS+, soft, nontender Derm: no cyanosis or rash Psyche: normal mood and affect   Hospital records from 02/2016 reviewed where he had a TIA.   LFT's in November 2017 normal  BMET    Component Value Date/Time   NA 139 02/14/2016 0443   NA 140 12/29/2013 0411   K 4.2 02/14/2016 0443   K 4.1 12/29/2013 0411   CL 106 02/14/2016 0443   CL 106 12/29/2013 0411   CO2 26 02/14/2016 0443   CO2 29 12/29/2013 0411   GLUCOSE 113 (H) 02/14/2016 0443   GLUCOSE 121 (H) 12/29/2013 0411   BUN 12 02/14/2016 0443   BUN 10 12/29/2013 0411   CREATININE 0.74 02/14/2016 0443   CREATININE 0.63 08/20/2014 0813   CALCIUM 8.6 (L) 02/14/2016 0443   CALCIUM 8.0 (L) 12/29/2013 0411   GFRNONAA >60 02/14/2016 0443   GFRNONAA >89 08/20/2014 0813   GFRAA >60 02/14/2016 0443   GFRAA >89 08/20/2014 0813       Assessment & Plan:  Impression: IPF Cough GERD Obesity Medication non-compliance  Discussion: [name] continues to struggle with dyspnea which I believe is due to progression of his idiopathic pulmonary fibrosis. I also believe his cough is related to this however acid reflux may also be contributing to his cough. We reviewed lifestyle modification changes for gastroesophageal reflux today. Medication noncompliance is a major problem with Molly Maduroobert. He has not been taking his Esbriet appropriately. We discussed in no uncertain terms the Roland best. In the fact that I clearly want him to continue taking it.  For your IPF: Take Esbriet 2 pills 3 times a day We will collect liver function testing today to monitor for drug toxicity and call you with those results We will arrange a lung function test in April as well as a 6 minute walk test to monitor for disease activity We may need to consider referral to the [facility] transplant center today Stay as active as possible and exercise regularly  For your acid reflux: Keep taking the Nexium twice a day Follow the acid reflux lifestyle  modification sheet we gave you  For your cough: Take tessalon as needed for cough  We will see you back in April   > 15 minutes spent face to face 27 minute visit  Updated Medication List Outpatient Encounter Prescriptions as of 05/04/2016  Medication Sig  . albuterol (PROVENTIL HFA;VENTOLIN HFA) 108 (90 BASE) MCG/ACT inhaler Inhale 2 puffs into the lungs every 6 (six) hours as needed for wheezing or shortness of breath.  Marland Kitchen. aspirin EC 81 MG tablet Take 1 tablet (81 mg total) by mouth daily.  Marland Kitchen. atorvastatin (LIPITOR) 40 MG tablet Take 1 tablet (40 mg total) by mouth daily at 6 PM.  . Bioflavonoid Products (ESTER C PO) Take 1 tablet by mouth daily.  . carbamazepine (TEGRETOL XR) 100 MG 12 hr tablet Take 100-200 mg by mouth 2 (two) times daily. 2 tab qam, 2  tabs qhs  . Cholecalciferol (VITAMIN D-3) 5000 UNITS TABS Take 1 tablet by mouth daily.  Marland Kitchen docusate sodium (COLACE) 100 MG capsule Take 100 mg by mouth at bedtime.  Marland Kitchen esomeprazole (NEXIUM) 40 MG capsule Take 1 capsule (40 mg total) by mouth 2 (two) times daily before a meal.  . ferrous gluconate (FERGON) 324 MG tablet Take 324 mg by mouth every other day. At night  . fluticasone (FLONASE) 50 MCG/ACT nasal spray Place 2 sprays into both nostrils daily.  Marland Kitchen lamoTRIgine (LAMICTAL) 200 MG tablet Take 200 mg by mouth 2 (two) times daily.  . Probiotic Product (PROBIOTIC DAILY PO) Take 1 tablet by mouth daily.  Marland Kitchen Respiratory Therapy Supplies (FLUTTER) DEVI Use as directed  . [DISCONTINUED] Pirfenidone 267 MG CAPS Take 2 capsules by mouth 3 (three) times daily. (Patient not taking: Reported on 05/04/2016)   No facility-administered encounter medications on file as of 05/04/2016.

## 2016-05-08 DIAGNOSIS — Z79899 Other long term (current) drug therapy: Secondary | ICD-10-CM | POA: Diagnosis not present

## 2016-05-08 DIAGNOSIS — F319 Bipolar disorder, unspecified: Secondary | ICD-10-CM | POA: Diagnosis not present

## 2016-05-11 ENCOUNTER — Telehealth: Payer: Self-pay | Admitting: Pulmonary Disease

## 2016-05-11 NOTE — Telephone Encounter (Signed)
Notes Recorded by Lupita Leashouglas B McQuaid, MD on 05/07/2016 at 8:08 PM EST A, Please let the patient know this was OK Thanks, B  Called and spoke to pt. Informed him of the results of labs per BQ. Pt verbalized understanding and denied any further questions or concerns at this time.

## 2016-05-11 NOTE — Progress Notes (Signed)
Called and spoke to pt. Informed him of the results of labs per BQ. Pt verbalized understanding and denied any further questions or concerns at this time.

## 2016-06-06 ENCOUNTER — Other Ambulatory Visit: Payer: Self-pay | Admitting: *Deleted

## 2016-06-06 MED ORDER — ESOMEPRAZOLE MAGNESIUM 40 MG PO CPDR: 40.0000 mg | DELAYED_RELEASE_CAPSULE | Freq: Two times a day (BID) | ORAL | 3 refills | Status: DC

## 2016-07-10 ENCOUNTER — Ambulatory Visit: Payer: BLUE CROSS/BLUE SHIELD | Admitting: Pulmonary Disease

## 2016-07-10 ENCOUNTER — Ambulatory Visit: Payer: BLUE CROSS/BLUE SHIELD

## 2016-07-10 DIAGNOSIS — F458 Other somatoform disorders: Secondary | ICD-10-CM | POA: Diagnosis not present

## 2016-07-10 DIAGNOSIS — J309 Allergic rhinitis, unspecified: Secondary | ICD-10-CM | POA: Diagnosis not present

## 2016-07-10 DIAGNOSIS — R070 Pain in throat: Secondary | ICD-10-CM | POA: Diagnosis not present

## 2016-07-10 DIAGNOSIS — R05 Cough: Secondary | ICD-10-CM | POA: Diagnosis not present

## 2016-07-10 DIAGNOSIS — J301 Allergic rhinitis due to pollen: Secondary | ICD-10-CM | POA: Diagnosis not present

## 2016-08-06 DIAGNOSIS — F319 Bipolar disorder, unspecified: Secondary | ICD-10-CM | POA: Diagnosis not present

## 2016-08-06 DIAGNOSIS — Z79899 Other long term (current) drug therapy: Secondary | ICD-10-CM | POA: Diagnosis not present

## 2016-08-09 DIAGNOSIS — J449 Chronic obstructive pulmonary disease, unspecified: Secondary | ICD-10-CM | POA: Diagnosis not present

## 2016-08-09 DIAGNOSIS — R531 Weakness: Secondary | ICD-10-CM | POA: Diagnosis not present

## 2016-08-09 DIAGNOSIS — I10 Essential (primary) hypertension: Secondary | ICD-10-CM | POA: Diagnosis not present

## 2016-08-09 DIAGNOSIS — R0602 Shortness of breath: Secondary | ICD-10-CM | POA: Diagnosis not present

## 2016-09-18 ENCOUNTER — Other Ambulatory Visit (INDEPENDENT_AMBULATORY_CARE_PROVIDER_SITE_OTHER): Payer: BLUE CROSS/BLUE SHIELD

## 2016-09-18 ENCOUNTER — Ambulatory Visit (INDEPENDENT_AMBULATORY_CARE_PROVIDER_SITE_OTHER): Payer: BLUE CROSS/BLUE SHIELD | Admitting: *Deleted

## 2016-09-18 ENCOUNTER — Encounter: Payer: Self-pay | Admitting: Pulmonary Disease

## 2016-09-18 ENCOUNTER — Ambulatory Visit (INDEPENDENT_AMBULATORY_CARE_PROVIDER_SITE_OTHER): Payer: BLUE CROSS/BLUE SHIELD | Admitting: Pulmonary Disease

## 2016-09-18 VITALS — BP 122/80 | HR 86 | Ht 74.0 in | Wt 288.0 lb

## 2016-09-18 DIAGNOSIS — Z5181 Encounter for therapeutic drug level monitoring: Secondary | ICD-10-CM | POA: Diagnosis not present

## 2016-09-18 DIAGNOSIS — J84112 Idiopathic pulmonary fibrosis: Secondary | ICD-10-CM | POA: Diagnosis not present

## 2016-09-18 LAB — PULMONARY FUNCTION TEST
DL/VA % PRED: 94 %
DL/VA: 4.56 ml/min/mmHg/L
DLCO cor % pred: 45 %
DLCO cor: 17.15 ml/min/mmHg
DLCO unc % pred: 45 %
DLCO unc: 17.34 ml/min/mmHg
FEF 25-75 Post: 3.92 L/sec
FEF 25-75 Pre: 2.73 L/sec
FEF2575-%CHANGE-POST: 43 %
FEF2575-%PRED-POST: 102 %
FEF2575-%Pred-Pre: 71 %
FEV1-%CHANGE-POST: 9 %
FEV1-%Pred-Post: 58 %
FEV1-%Pred-Pre: 53 %
FEV1-PRE: 2.35 L
FEV1-Post: 2.58 L
FEV1FVC-%Change-Post: 6 %
FEV1FVC-%PRED-PRE: 106 %
FEV6-%Change-Post: 2 %
FEV6-%Pred-Post: 52 %
FEV6-%Pred-Pre: 50 %
FEV6-Post: 2.89 L
FEV6-Pre: 2.82 L
FEV6FVC-%Pred-Post: 103 %
FEV6FVC-%Pred-Pre: 103 %
FVC-%CHANGE-POST: 2 %
FVC-%PRED-PRE: 49 %
FVC-%Pred-Post: 50 %
FVC-POST: 2.89 L
FVC-PRE: 2.82 L
POST FEV1/FVC RATIO: 89 %
PRE FEV6/FVC RATIO: 100 %
Post FEV6/FVC ratio: 100 %
Pre FEV1/FVC ratio: 83 %
RV % PRED: 49 %
RV: 1.12 L
TLC % pred: 51 %
TLC: 3.96 L

## 2016-09-18 LAB — HEPATIC FUNCTION PANEL
ALBUMIN: 4.6 g/dL (ref 3.5–5.2)
ALT: 28 U/L (ref 0–53)
AST: 27 U/L (ref 0–37)
Alkaline Phosphatase: 56 U/L (ref 39–117)
Bilirubin, Direct: 0.1 mg/dL (ref 0.0–0.3)
Total Bilirubin: 0.4 mg/dL (ref 0.2–1.2)
Total Protein: 7.4 g/dL (ref 6.0–8.3)

## 2016-09-18 MED ORDER — ALBUTEROL SULFATE HFA 108 (90 BASE) MCG/ACT IN AERS: 2.0000 | INHALATION_SPRAY | Freq: Four times a day (QID) | RESPIRATORY_TRACT | 11 refills | Status: AC | PRN

## 2016-09-18 MED ORDER — ALBUTEROL SULFATE (2.5 MG/3ML) 0.083% IN NEBU: 2.5000 mg | INHALATION_SOLUTION | Freq: Two times a day (BID) | RESPIRATORY_TRACT | 11 refills | Status: DC

## 2016-09-18 NOTE — Progress Notes (Signed)
SIX MIN WALK 09/18/2016 01/20/2015 10/05/2014 06/22/2014 04/06/2014  Medications ventolin @ 12pm, Tegretol 100mg , Nexium 40mg , Lamictal 200mg  all taken at 7am Aspirin 81mg , tegretol 100mg , Nexium 40mg , flonase 50mcg, lamictal 200mg , pirfenidone 261mg . All meds taken at 8am Flonase, Tegretol, Nexium, Lamictal - -  Supplimental Oxygen during Test? (L/min) No No No No No  Laps 8 9 7  - -  Partial Lap (in Meters) 0 47 36 - -  Baseline BP (sitting) 124/62 116/80 124/76 - -  Baseline Heartrate 89 71 73 - -  Baseline Dyspnea (Borg Scale) 1 2 3  - -  Baseline Fatigue (Borg Scale) 7 3 0 - -  Baseline SPO2 97 98 97 - -  BP (sitting) 126/72 122/78 132/78 - -  Heartrate 108 78 88 - -  Dyspnea (Borg Scale) 4 3 4  - -  Fatigue (Borg Scale) 7 3 2  - -  SPO2 91 97 97 - -  BP (sitting) 124/68 118/82 110/72 - -  Heartrate 93 74 77 - -  SPO2 95 95 96 - -  Stopped or Paused before Six Minutes No No No - -  Interpretation - - Dizziness - -  Distance Completed [phone] - -  Tech Comments: pt completed test at moderate pace with no complaints or desats.  Pt ambulated at a moderate to fast pace with a steady gait Pt walked at a fast pace. Pt was able to complete 6 laps here in [place]. Pt did not req suppl 02. Pt did not c/o sob,chest tightness. pt was able to complete 3 laps. pt reported sob and chest tightness but did not want to end walk.

## 2016-09-18 NOTE — Patient Instructions (Signed)
For your cough: Stay away from dusty environments Keep using albuterol as needed Follow the recommendations as directed by Dr. Jenne CampusMcQueen We will request records from the [place] [facility] office visit  For your idiopathic pulmonary fibrosis: Keep taking Esbriet 2 pills 3 times a day We will check a liver function tests today I will ask about therapeutic clinical trials for idiopathic fibrosis  For your obesity and shortness of breath: Exercise, exercise, exercise Eat whole foods, not processed foods Try to lose weight as best as possible  We will refer you to the [facility] transplant clinic  We will see you back in 3 months or sooner if needed

## 2016-09-18 NOTE — Progress Notes (Signed)
PFT done today. 

## 2016-09-18 NOTE — Progress Notes (Signed)
Subjective:    Patient ID: Kevin Boyd, male    DOB: [birth year], 51 y.o.   MRN: [id number]  Synopsis: First evaluated by [facility] in 2015 for usual interstitial pneumonitis found on a biopsy. He has a family history significant for pulmonary fibrosis in that his mother died and he has a brother with the disease. Serology panel in 2015 was negative for connective tissue disease.  Diagnosed with IPF with an open lung biopsy.    HPI Chief Complaint  Patient presents with  . Follow-up    pt reports he has been better for 6 months but is having more SOB, cannot carry heavy things, any exertion causes him SOB, even with little tasks    Kevin MaduroRobert says that his symptoms have become severely worse in the last few weeks. He says that doing any activity makes him worse.  He says taht any activity makes him worse.  Coughing frequently, gasping for air.  Just walking a short distance will make him dyspneic.    He is still taking Esbriet 2 pills three times per day.  No GI symptoms but he notes poor appetite.    He has been to ENT twice who says that he has not sinus symptoms or post nasal drip contributing to the cough.  He has a lot of cough and sneezing, but he says that ENT says that he doesn't have allergies.  No post nasal drip or sinus congestion.    He ws told that he has "spasms" in his throat that lead to cough.  He says that taking ginger and honey makes it better.  Tessalon didn't help.  No correllation with eating or food.     Past Medical History:  Diagnosis Date  . Asthma   . Bipolar 1 disorder (HCC)   . Borderline diabetes mellitus   . C. difficile diarrhea   . COPD (chronic obstructive pulmonary disease) (HCC)   . GERD (gastroesophageal reflux disease)   . Interstitial lung disease (HCC)   . Scoliosis   . Seasonal allergies   . Sleep apnea       Review of Systems  Constitutional: Negative for chills and fever.  HENT: Negative for postnasal drip, rhinorrhea and  sinus pressure.   Respiratory: Positive for shortness of breath. Negative for wheezing.   Cardiovascular: Negative for chest pain, palpitations and leg swelling.  Gastrointestinal: Negative for diarrhea.       Objective:   Physical Exam Vitals:   09/18/16 1551  BP: 122/80  Pulse: 86  SpO2: 93%  Weight: 288 lb (130.6 kg)  Height: 6\' 2"  (1.88 m)  RA  Gen: well appearing HENT: OP clear, TM's clear, neck supple PULM: Crakcles 1/2 way up B, normal percussion CV: RRR, no mgr, trace edema GI: BS+, soft, nontender Derm: no cyanosis or rash Psyche: normal mood and affect     6 Min Walk: 04/2014 6MW 1660 feet, O2 98% RA July 2016 6 minute walk> 372 m) 1220 feet) O2 sat 96% October 2016 6 minute walk test 479 m, O2 saturation 97%. October 2016 6 minute walk test 479 m, O2 saturation 97%. 08/2016 6 MW: 384 M, O2 sat nadir 91% RA  Imaging: 09/2002 [place] CXR showed infiltrates transiently that cleared on a follow up film one month later August 2015 CT chest> There is groundglass opacification, scattered intralobular septal thickening, traction bronchiectasis and peripheral based honeycombing. The majority of the interstitial changes are peripheral based. There does not appear to be a craniocaudal  gradient 05/2015 CT sinus > normal   Labs: 01/2014 ANA, RF, SCL-70, SSA/SSB, Aldolase, Anti-Jo-1, centromere all neg  Path: 12/28/2013 open lung biopsy: pulmonary fibrosis, severe, 1 sections show honeycombing, fibroblastic foci and overall variegated appearance with subpleural accentuation fibrosis, most consistent with usual interstitial pneumonitis. Granulomas are not seen.  Pulmonary function testing: 03/22/2014 PFT> Ratio 82%, FEV1 2.91L (74% pred, 8% pred), TLCO 4.67L (62% pred), DLCO 35.4 (45% pred) July 2016 pulmonary function testing> ratio 72%, FEV1 2.44 L (54% Pred), forced vital capacity 3.39 L (58% predicted), total lung capacity 5.48 L (70% predicted), DLCO 22.72 (60%  predicted) . October 2016 pulmonary function testing ratio 88%, FEV1 3.47 L (77% predicted), total lung capacity 5.38 L (69% productive) sees, DLCO 20.55 (54% predicted) October 2017 pulmonary function testing ratio 89% FVC 3.52 L 61% predicted total lung capacity 4.66 L 59% predicted DLCO 13.31 45% predicted June 2018 ratio normal, FVC 2.89 L 50% predicted, total lung capacity 3.96 L 51% predicted, DLCO 17.34 45% predicted  BMET    Component Value Date/Time   NA 139 02/14/2016 0443   NA 140 12/29/2013 0411   K 4.2 02/14/2016 0443   K 4.1 12/29/2013 0411   CL 106 02/14/2016 0443   CL 106 12/29/2013 0411   CO2 26 02/14/2016 0443   CO2 29 12/29/2013 0411   GLUCOSE 113 (H) 02/14/2016 0443   GLUCOSE 121 (H) 12/29/2013 0411   BUN 12 02/14/2016 0443   BUN 10 12/29/2013 0411   CREATININE 0.74 02/14/2016 0443   CREATININE 0.63 08/20/2014 0813   CALCIUM 8.6 (L) 02/14/2016 0443   CALCIUM 8.0 (L) 12/29/2013 0411   GFRNONAA >60 02/14/2016 0443   GFRNONAA >89 08/20/2014 0813   GFRAA >60 02/14/2016 0443   GFRAA >89 08/20/2014 0813       Assessment & Plan:   Idiopathic pulmonary fibrosis Obesity Deconditioning Shortness of breath Cough  Discussion: Unfortunately Kevin Boyd symptoms have worsened in the last 6 months despite compliance with Esbriet. His lung function testing shows a decline in diffusion capacity and his 6 minute walk distance has decreased. Some of this may be due to his obesity, but I think with his overall decline in total lung capacity over the last several years as well as diffusion capacity with the known diagnosis of idiopathic pulmonary fibrosis is clear that his disease is progressing.  Today we talked about the fact that future treatment options for him include either clinical trials available, currently I do not believe there are any in our area, or lung transplantation. Barriers to lung transplantation for Kevin Boyd include noncompliance with medications in the past,  and obesity. That said, I think it's reasonable for him to at least go and talk to the [facility] transplant clinic so they can tell him if there are any absolute barriers which would prevent him from ever considering lung transplant.  Today as in every visit in the last several years since I've known him I have encouraged him to exercise regularly to try to lose weight as I have always been concerned that lung transplant was going to be something we needed to consider. Sadly he has not really exercised regularly or lost the weight as we have discussed for years.  Plan: For your cough: Stay away from dusty environments Keep using albuterol as needed Follow the recommendations as directed by Dr. Jenne Campus We will request records from the [facility] office visit to ensure no evidence of aspiration  For your idiopathic pulmonary  fibrosis: Keep taking Esbriet 2 pills 3 times a day We will check a liver function tests today I will ask about therapeutic clinical trials for idiopathic fibrosis  For your obesity and shortness of breath: Exercise, exercise, exercise Eat whole foods, not processed foods Try to lose weight as best as possible  We will refer you to the [facility] lung transplant clinic  We will see you back in 3 months or sooner if needed   Updated Medication List Outpatient Encounter Prescriptions as of 09/18/2016  Medication Sig  . albuterol (PROVENTIL HFA;VENTOLIN HFA) 108 (90 BASE) MCG/ACT inhaler Inhale 2 puffs into the lungs every 6 (six) hours as needed for wheezing or shortness of breath.  Marland Kitchen aspirin EC 81 MG tablet Take 1 tablet (81 mg total) by mouth daily.  Marland Kitchen atorvastatin (LIPITOR) 40 MG tablet Take 1 tablet (40 mg total) by mouth daily at 6 PM.  . Bioflavonoid Products (ESTER C PO) Take 1 tablet by mouth daily.  . carbamazepine (TEGRETOL XR) 100 MG 12 hr tablet Take 100-200 mg by mouth 2 (two) times daily. 2 tab qam, 2 tabs qhs  . Cholecalciferol (VITAMIN D-3)  5000 UNITS TABS Take 1 tablet by mouth daily.  Marland Kitchen docusate sodium (COLACE) 100 MG capsule Take 100 mg by mouth at bedtime.  Marland Kitchen esomeprazole (NEXIUM) 40 MG capsule Take 1 capsule (40 mg total) by mouth 2 (two) times daily before a meal.  . ferrous gluconate (FERGON) 324 MG tablet Take 324 mg by mouth every other day. At night  . fluticasone (FLONASE) 50 MCG/ACT nasal spray Place 2 sprays into both nostrils daily.  Marland Kitchen lamoTRIgine (LAMICTAL) 200 MG tablet Take 200 mg by mouth 2 (two) times daily.  . Probiotic Product (PROBIOTIC DAILY PO) Take 1 tablet by mouth daily.  Marland Kitchen Respiratory Therapy Supplies (FLUTTER) DEVI Use as directed   No facility-administered encounter medications on file as of 09/18/2016.

## 2016-09-21 ENCOUNTER — Telehealth: Payer: Self-pay | Admitting: Pulmonary Disease

## 2016-09-21 NOTE — Telephone Encounter (Signed)
Notes recorded by Lupita LeashMcQuaid, Douglas B, MD on 09/20/2016 at 8:42 AM EDT A, Please let the patient know this was OK Thanks, B -------------------------------- Spoke with pt. He is aware of results. Nothing further was needed.

## 2016-09-21 NOTE — Telephone Encounter (Signed)
Patient is asking that we call back on his cell # [phone].

## 2016-10-02 ENCOUNTER — Telehealth: Payer: Self-pay | Admitting: Pulmonary Disease

## 2016-10-02 NOTE — Telephone Encounter (Signed)
Pt returning call and can be reached @ [phone].Caren GriffinsStanley A Dalton

## 2016-10-02 NOTE — Telephone Encounter (Signed)
lmomtcb x1 

## 2016-10-02 NOTE — Telephone Encounter (Signed)
Spoke with pt, states that he is having increased daytime sleepiness Xapprox 3 days.  Pt has noticed gradually increasing fatigue with any exertion for several months, but notes himself feeling extremely fatigued and sleepy around 2:00, to the point where he is "slapping myself awake" or taking naps.  Pt denies any increased breathing difficulties- states he is at his baseline sob with any exertion.  Pt had a 6mw on last OV with no documented O2 desat.  Pt is not on supplemental O2 or pap machine qhs.  Denies any new med starts since onset of symptoms.  Pt is concerned that this may just be from his worsening fibrosis and wants to see what can be done for this.    I offered to get message to DOD to be addressed today, but pt is requesting that this be handled by BQ since "I'm not really sick, just sleepy".  I advised that BQ is working nights in hospital and we are closed 7/4, so it would be Thursday before we have recs for him.  Pt expressed understanding.    BQ please advise.  Thanks!

## 2016-10-03 NOTE — Telephone Encounter (Signed)
Given his history of sleep apnea it sounds like he needs to have a sleep study. However if his symptoms have been rapidly worsening in the last few weeks that is abnormal so he would need an office visit to evaluate.  Otherwise if the symptoms have developed gradually over the last several months then just order a split night.

## 2016-10-04 NOTE — Telephone Encounter (Signed)
While attempting to relay BQ's recommendations to pt, the phone was disconnected. I have attempted to call back and received vm. lmtcb for pt.

## 2016-10-04 NOTE — Telephone Encounter (Signed)
Spoke with pt, who states symptoms developed over the last week. I have offered pt an apt with another provider as BQ is not back in office until the week of 10/22/16 with no availability that week. Pt declined apt with another provider, as BQ knows his medical history. Pt states he has an apt with [facility] 10/19/16 and is willing to wait to see if these symptoms subside.   BQ please advise. Thanks.

## 2016-10-04 NOTE — Telephone Encounter (Signed)
OK, see if we can order a sleep study due to sleep apnea and sleepiness

## 2016-10-05 NOTE — Telephone Encounter (Signed)
Called and spoke with pt and he stated that he was trying to tell the person he was on the phone with yesterday this information but his phone kept going in and out----pt stated that he had a sleep study done about 3 years ago and he is on cpap every night---DME>[facility].  BQ please advise. Thanks

## 2016-10-06 NOTE — Telephone Encounter (Signed)
Find the results and put in my look at and I'll sort it out.  Please no more messages about this until then.  Thanks.

## 2016-10-08 NOTE — Telephone Encounter (Signed)
Called [facility] at [phone] this morning. They are not open yet. Will call back during their business hours.

## 2016-10-09 NOTE — Telephone Encounter (Signed)
Spoke with [facility] - requested the most recent sleep study be faxed to our office Efraim KaufmannMelissa states that she is faxing over the sleep study from 2013 These are being faxed to the main fax #  Will hold in triage to follow up to ensure this is received.

## 2016-10-09 NOTE — Telephone Encounter (Signed)
Will need to call [facility] once they open after 9 this morning.

## 2016-10-10 NOTE — Telephone Encounter (Signed)
Received fax. Will place in BQ's look-at folder and will close this message.

## 2016-10-10 NOTE — Telephone Encounter (Signed)
Just up front as well in Kevin Boyd's cubby but I do not see where we have it. Called [facility] again and spoke with Efraim KaufmannMelissa, she will try the triage fax machine to see if we receive it. Will await to see if we receive it

## 2016-10-12 ENCOUNTER — Telehealth: Payer: Self-pay | Admitting: Pulmonary Disease

## 2016-10-12 ENCOUNTER — Encounter: Payer: Self-pay | Admitting: Pulmonary Disease

## 2016-10-12 DIAGNOSIS — G4733 Obstructive sleep apnea (adult) (pediatric): Secondary | ICD-10-CM | POA: Insufficient documentation

## 2016-10-12 NOTE — Telephone Encounter (Signed)
A,  I was able to review his Split night study from Dr. Lillette BoxerFlemming's office in 2015. It showed significant sleep apnea and CPAP was titrated to 13cm H20. Based on this and his new complaint of fatigue, he needs CPAP ordered 13cm H20 and a download report in 3 weeks.  Thanks,  B  --------------------------------------- Spoke with pt. He is aware of BQ's message. Pt states that he has started using the CPAP again. Reports that he is having issues with this but he is going to continue to try to use CPAP. Advised him to contact us if he continues to have issues. Nothing further was needed.

## 2016-10-24 DIAGNOSIS — E669 Obesity, unspecified: Secondary | ICD-10-CM | POA: Diagnosis not present

## 2016-10-24 DIAGNOSIS — R918 Other nonspecific abnormal finding of lung field: Secondary | ICD-10-CM | POA: Diagnosis not present

## 2016-10-24 DIAGNOSIS — J84112 Idiopathic pulmonary fibrosis: Secondary | ICD-10-CM | POA: Diagnosis not present

## 2016-10-24 DIAGNOSIS — Z7682 Awaiting organ transplant status: Secondary | ICD-10-CM | POA: Diagnosis not present

## 2016-10-24 DIAGNOSIS — Z713 Dietary counseling and surveillance: Secondary | ICD-10-CM | POA: Diagnosis not present

## 2016-10-24 DIAGNOSIS — Z6836 Body mass index (BMI) 36.0-36.9, adult: Secondary | ICD-10-CM | POA: Diagnosis not present

## 2016-10-24 DIAGNOSIS — Z87891 Personal history of nicotine dependence: Secondary | ICD-10-CM | POA: Diagnosis not present

## 2016-10-24 DIAGNOSIS — J8410 Pulmonary fibrosis, unspecified: Secondary | ICD-10-CM | POA: Diagnosis not present

## 2016-10-25 DIAGNOSIS — Z7682 Awaiting organ transplant status: Secondary | ICD-10-CM | POA: Diagnosis not present

## 2016-10-25 DIAGNOSIS — F54 Psychological and behavioral factors associated with disorders or diseases classified elsewhere: Secondary | ICD-10-CM | POA: Diagnosis not present

## 2016-10-25 DIAGNOSIS — F3110 Bipolar disorder, current episode manic without psychotic features, unspecified: Secondary | ICD-10-CM | POA: Diagnosis not present

## 2016-10-25 DIAGNOSIS — J849 Interstitial pulmonary disease, unspecified: Secondary | ICD-10-CM | POA: Diagnosis not present

## 2016-10-25 DIAGNOSIS — Z87891 Personal history of nicotine dependence: Secondary | ICD-10-CM | POA: Diagnosis not present

## 2016-11-01 DIAGNOSIS — J449 Chronic obstructive pulmonary disease, unspecified: Secondary | ICD-10-CM | POA: Diagnosis not present

## 2016-11-01 DIAGNOSIS — J84112 Idiopathic pulmonary fibrosis: Secondary | ICD-10-CM | POA: Diagnosis not present

## 2016-11-01 DIAGNOSIS — E668 Other obesity: Secondary | ICD-10-CM | POA: Diagnosis not present

## 2016-11-09 DIAGNOSIS — R7303 Prediabetes: Secondary | ICD-10-CM | POA: Diagnosis not present

## 2016-11-09 DIAGNOSIS — E782 Mixed hyperlipidemia: Secondary | ICD-10-CM | POA: Diagnosis not present

## 2016-11-09 DIAGNOSIS — I952 Hypotension due to drugs: Secondary | ICD-10-CM | POA: Diagnosis not present

## 2016-11-12 DIAGNOSIS — R7303 Prediabetes: Secondary | ICD-10-CM | POA: Diagnosis not present

## 2016-11-12 DIAGNOSIS — E782 Mixed hyperlipidemia: Secondary | ICD-10-CM | POA: Diagnosis not present

## 2016-11-27 DIAGNOSIS — F319 Bipolar disorder, unspecified: Secondary | ICD-10-CM | POA: Diagnosis not present

## 2016-11-29 DIAGNOSIS — G4733 Obstructive sleep apnea (adult) (pediatric): Secondary | ICD-10-CM | POA: Diagnosis not present

## 2016-12-02 DIAGNOSIS — J84112 Idiopathic pulmonary fibrosis: Secondary | ICD-10-CM | POA: Diagnosis not present

## 2016-12-02 DIAGNOSIS — E668 Other obesity: Secondary | ICD-10-CM | POA: Diagnosis not present

## 2016-12-02 DIAGNOSIS — J449 Chronic obstructive pulmonary disease, unspecified: Secondary | ICD-10-CM | POA: Diagnosis not present

## 2016-12-24 ENCOUNTER — Ambulatory Visit: Payer: BLUE CROSS/BLUE SHIELD | Admitting: Pulmonary Disease

## 2016-12-24 NOTE — Progress Notes (Deleted)
Subjective:    Patient ID: Kevin Boyd, male    DOB: Jan 16, 1966, 51 y.o.   MRN: [id number]  Synopsis: First evaluated by [facility] in 2015 for usual interstitial pneumonitis found on a biopsy. He has a family history significant for pulmonary fibrosis in that his mother died and he has a brother with the disease. Serology panel in 2015 was negative for connective tissue disease.  Diagnosed with IPF with an open lung biopsy.    HPI No chief complaint on file.   ***   Past Medical History:  Diagnosis Date  . Asthma   . Bipolar 1 disorder (HCC)   . Borderline diabetes mellitus   . C. difficile diarrhea   . COPD (chronic obstructive pulmonary disease) (HCC)   . GERD (gastroesophageal reflux disease)   . Interstitial lung disease (HCC)   . Scoliosis   . Seasonal allergies   . Sleep apnea       Review of Systems  Constitutional: Negative for chills and fever.  HENT: Negative for postnasal drip, rhinorrhea and sinus pressure.   Respiratory: Positive for shortness of breath. Negative for wheezing.   Cardiovascular: Negative for chest pain, palpitations and leg swelling.  Gastrointestinal: Negative for diarrhea.       Objective:   Physical Exam There were no vitals filed for this visit.RA  ***     6 Min Walk: 04/2014 1660 feet, O2 98% RA July 2016 6 minute walk> 372 m) 1220 feet) O2 sat 96% October 2016 6 minute walk test 479 m, O2 saturation 97%. October 2016 6 minute walk test 479 m, O2 saturation 97%. 08/2016 6 MW: 384 M, O2 sat nadir 91% RA  Imaging: 09/2002 [place] CXR showed infiltrates transiently that cleared on a follow up film one month later August 2015 CT chest> There is groundglass opacification, scattered intralobular septal thickening, traction bronchiectasis and peripheral based honeycombing. The majority of the interstitial changes are peripheral based. There does not appear to be a craniocaudal gradient 05/2015 CT sinus >  normal   Labs: 01/2014 ANA, RF, SCL-70, SSA/SSB, Aldolase, Anti-Jo-1, centromere all neg  Path: 12/28/2013 open lung biopsy: pulmonary fibrosis, severe, 1 sections show honeycombing, fibroblastic foci and overall variegated appearance with subpleural accentuation fibrosis, most consistent with usual interstitial pneumonitis. Granulomas are not seen.  Pulmonary function testing: 03/22/2014 PFT> Ratio 82%, FEV1 2.91L (74% pred, 8% pred), TLCO 4.67L (62% pred), DLCO 35.4 (45% pred) July 2016 pulmonary function testing> ratio 72%, FEV1 2.44 L (54% Pred), forced vital capacity 3.39 L (58% predicted), total lung capacity 5.48 L (70% predicted), DLCO 22.72 (60% predicted) . October 2016 pulmonary function testing ratio 88%, FEV1 3.47 L (77% predicted), total lung capacity 5.38 L (69% productive) sees, DLCO 20.55 (54% predicted) October 2017 pulmonary function testing ratio 89% FVC 3.52 L 61% predicted total lung capacity 4.66 L 59% predicted DLCO 13.31 45% predicted June 2018 ratio normal, FVC 2.89 L 50% predicted, total lung capacity 3.96 L 51% predicted, DLCO 17.34 45% predicted  BMET    Component Value Date/Time   NA 139 02/14/2016 0443   NA 140 12/29/2013 0411   K 4.2 02/14/2016 0443   K 4.1 12/29/2013 0411   CL 106 02/14/2016 0443   CL 106 12/29/2013 0411   CO2 26 02/14/2016 0443   CO2 29 12/29/2013 0411   GLUCOSE 113 (H) 02/14/2016 0443   GLUCOSE 121 (H) 12/29/2013 0411   BUN 12 02/14/2016 0443   BUN 10 12/29/2013 0411  CREATININE 0.74 02/14/2016 0443   CREATININE 0.63 08/20/2014 0813   CALCIUM 8.6 (L) 02/14/2016 0443   CALCIUM 8.0 (L) 12/29/2013 0411   GFRNONAA >60 02/14/2016 0443   GFRNONAA >89 08/20/2014 0813   GFRAA >60 02/14/2016 0443   GFRAA >89 08/20/2014 0813       Assessment & Plan:   No diagnosis found.  Discussion: ***    Current Outpatient Prescriptions:  .  albuterol (PROVENTIL HFA;VENTOLIN HFA) 108 (90 Base) MCG/ACT inhaler, Inhale 2 puffs into the  lungs every 6 (six) hours as needed for wheezing or shortness of breath., Disp: 1 Inhaler, Rfl: 11 .  albuterol (PROVENTIL) (2.5 MG/3ML) 0.083% nebulizer solution, Take 3 mLs (2.5 mg total) by nebulization 2 (two) times daily., Disp: 75 mL, Rfl: 11 .  aspirin EC 81 MG tablet, Take 1 tablet (81 mg total) by mouth daily., Disp: 30 tablet, Rfl: 0 .  atorvastatin (LIPITOR) 40 MG tablet, Take 1 tablet (40 mg total) by mouth daily at 6 PM., Disp: 30 tablet, Rfl: 0 .  Bioflavonoid Products (ESTER C PO), Take 1 tablet by mouth daily., Disp: , Rfl:  .  carbamazepine (TEGRETOL XR) 100 MG 12 hr tablet, Take 100-200 mg by mouth 2 (two) times daily. 2 tab qam, 2 tabs qhs, Disp: , Rfl:  .  Cholecalciferol (VITAMIN D-3) 5000 UNITS TABS, Take 1 tablet by mouth daily., Disp: , Rfl:  .  docusate sodium (COLACE) 100 MG capsule, Take 100 mg by mouth at bedtime., Disp: , Rfl:  .  esomeprazole (NEXIUM) 40 MG capsule, Take 1 capsule (40 mg total) by mouth 2 (two) times daily before a meal., Disp: 180 capsule, Rfl: 3 .  ferrous gluconate (FERGON) 324 MG tablet, Take 324 mg by mouth every other day. At night, Disp: , Rfl:  .  fluticasone (FLONASE) 50 MCG/ACT nasal spray, Place 2 sprays into both nostrils daily., Disp: , Rfl:  .  lamoTRIgine (LAMICTAL) 200 MG tablet, Take 200 mg by mouth 2 (two) times daily., Disp: , Rfl:  .  Probiotic Product (PROBIOTIC DAILY PO), Take 1 tablet by mouth daily., Disp: , Rfl:  .  Respiratory Therapy Supplies (FLUTTER) DEVI, Use as directed, Disp: 1 each, Rfl: 0

## 2017-01-01 DIAGNOSIS — J84112 Idiopathic pulmonary fibrosis: Secondary | ICD-10-CM | POA: Diagnosis not present

## 2017-01-01 DIAGNOSIS — E668 Other obesity: Secondary | ICD-10-CM | POA: Diagnosis not present

## 2017-01-01 DIAGNOSIS — J449 Chronic obstructive pulmonary disease, unspecified: Secondary | ICD-10-CM | POA: Diagnosis not present

## 2017-02-01 DIAGNOSIS — J449 Chronic obstructive pulmonary disease, unspecified: Secondary | ICD-10-CM | POA: Diagnosis not present

## 2017-02-01 DIAGNOSIS — E668 Other obesity: Secondary | ICD-10-CM | POA: Diagnosis not present

## 2017-02-01 DIAGNOSIS — J84112 Idiopathic pulmonary fibrosis: Secondary | ICD-10-CM | POA: Diagnosis not present

## 2017-02-25 DIAGNOSIS — Z79899 Other long term (current) drug therapy: Secondary | ICD-10-CM | POA: Diagnosis not present

## 2017-02-25 DIAGNOSIS — F319 Bipolar disorder, unspecified: Secondary | ICD-10-CM | POA: Diagnosis not present

## 2017-03-01 DIAGNOSIS — G4733 Obstructive sleep apnea (adult) (pediatric): Secondary | ICD-10-CM | POA: Diagnosis not present

## 2017-03-03 DIAGNOSIS — J84112 Idiopathic pulmonary fibrosis: Secondary | ICD-10-CM | POA: Diagnosis not present

## 2017-03-03 DIAGNOSIS — J449 Chronic obstructive pulmonary disease, unspecified: Secondary | ICD-10-CM | POA: Diagnosis not present

## 2017-03-03 DIAGNOSIS — E668 Other obesity: Secondary | ICD-10-CM | POA: Diagnosis not present

## 2017-03-06 ENCOUNTER — Telehealth: Payer: Self-pay | Admitting: Pulmonary Disease

## 2017-03-06 NOTE — Telephone Encounter (Signed)
Called and spoke with pt. Pt states [facility] prescribed oxygen. Pt is requesting POC.  Per pt [facility] asked that BQ order POC.  I have made pt aware of insurance requirements. Pt has been scheduled on 03/08/17 for OV and eval.  Nothing further is needed.

## 2017-03-08 ENCOUNTER — Ambulatory Visit: Payer: BLUE CROSS/BLUE SHIELD | Admitting: Pulmonary Disease

## 2017-03-14 DIAGNOSIS — E669 Obesity, unspecified: Secondary | ICD-10-CM | POA: Diagnosis not present

## 2017-03-14 DIAGNOSIS — E782 Mixed hyperlipidemia: Secondary | ICD-10-CM | POA: Diagnosis not present

## 2017-03-14 DIAGNOSIS — R7303 Prediabetes: Secondary | ICD-10-CM | POA: Diagnosis not present

## 2017-04-01 ENCOUNTER — Ambulatory Visit: Payer: BLUE CROSS/BLUE SHIELD | Admitting: Acute Care

## 2017-04-01 ENCOUNTER — Encounter: Payer: Self-pay | Admitting: Acute Care

## 2017-04-01 VITALS — BP 118/72 | HR 59 | Ht 74.0 in | Wt 251.0 lb

## 2017-04-01 DIAGNOSIS — R0602 Shortness of breath: Secondary | ICD-10-CM

## 2017-04-01 DIAGNOSIS — R0989 Other specified symptoms and signs involving the circulatory and respiratory systems: Secondary | ICD-10-CM | POA: Diagnosis not present

## 2017-04-01 MED ORDER — DOXYCYCLINE HYCLATE 100 MG PO TABS: 100.0000 mg | ORAL_TABLET | Freq: Two times a day (BID) | ORAL | 0 refills | Status: AC

## 2017-04-01 NOTE — Progress Notes (Signed)
History of Present Illness Kevin MediateRobert Boyd is a 51 y.o. male with  Idiopathic pulmonary fibrosis ( Biopsy proven ) and OSA which since weight loss he has not needed CPAP per Dr. Thad Rangereynolds.. He is followed by Dr. Delphina CahillMcQiuaid  Synopsis: First evaluated by [facility] in 2015 for usual interstitial pneumonitis found on a biopsy. He has a family history significant for pulmonary fibrosis in that his mother died and he has a brother with the disease. Serology panel in 2015 was negative for connective tissue disease.  Diagnosed with IPF with an open lung biopsy.  04/01/2017 Pt. Presents for evaluation for Imogen POC/ New congestion with yellow secretions.  Pt presents to the office today off oxygen with a sat of 93% after ambulating into the room. [facility] and has been told to lose 75 pounds. He has lost 40 pounds. He presents with complaint of chest congestion x 1 week  with yellow secretions. He did have fever x 2 days which has cleared.. He has been lethargic.He has not been using his flutter valve or nebs. He is off Esbriet per [facility]. He denies chest pain, orthopnea or hemoptysis   Test Results:  CBC Latest Ref Rng & Units 02/14/2016 02/13/2016 06/19/2015  WBC 3.8 - 10.6 K/uL 6.4 7.4 10.5  Hemoglobin 13.0 - 18.0 g/dL 16.113.6 09.614.4 04.513.7  Hematocrit 40.0 - 52.0 % 39.5(L) 40.9 39.6(L)  Platelets 150 - 440 K/uL 175 194 187    BMP Latest Ref Rng & Units 02/14/2016 02/13/2016 06/19/2015  Glucose 65 - 99 mg/dL 409(W113(H) 119(J145(H) 478(G167(H)  BUN 6 - 20 mg/dL 12 9 11   Creatinine 0.61 - 1.24 mg/dL 9.560.74 2.130.67 0.860.67  Sodium 135 - 145 mmol/L 139 137 136  Potassium 3.5 - 5.1 mmol/L 4.2 3.8 4.1  Chloride 101 - 111 mmol/L 106 105 107  CO2 22 - 32 mmol/L 26 25 24   Calcium 8.9 - 10.3 mg/dL 5.7(Q8.6(L) 9.0 9.0     PFT    Component Value Date/Time   FEV1PRE 2.35 09/18/2016 1413   FEV1POST 2.58 09/18/2016 1413   FVCPRE 2.82 09/18/2016 1413   FVCPOST 2.89 09/18/2016 1413   TLC 3.96  09/18/2016 1413   DLCOUNC 17.34 09/18/2016 1413   PREFEV1FVCRT 83 09/18/2016 1413   PSTFEV1FVCRT 89 09/18/2016 1413    No results found.   Past medical hx Past Medical History:  Diagnosis Date  . Asthma   . Bipolar 1 disorder (HCC)   . Borderline diabetes mellitus   . C. difficile diarrhea   . COPD (chronic obstructive pulmonary disease) (HCC)   . GERD (gastroesophageal reflux disease)   . Interstitial lung disease (HCC)   . Scoliosis   . Seasonal allergies   . Sleep apnea      Social History   Tobacco Use  . Smoking status: Former Smoker    Packs/day: 2.00    Years: 30.00    Pack years: 60.00    Types: Cigarettes    Last attempt to quit: 12/28/2013    Years since quitting: 3.2  . Smokeless tobacco: Former NeurosurgeonUser  . Tobacco comment: quit 2 years ago  Substance Use Topics  . Alcohol use: Yes    Alcohol/week: 0.0 oz    Comment: very occasional  . Drug use: Not on file    [name] reports that he quit smoking about 3 years ago. His smoking use included cigarettes. He has a 60.00 pack-year smoking history. He has quit using smokeless tobacco.  He reports that he drinks alcohol.  Tobacco Cessation: Former smoker quit 2015  Past surgical hx, Family hx, Social hx all reviewed.  Current Outpatient Medications on File Prior to Visit  Medication Sig  . albuterol (PROVENTIL HFA;VENTOLIN HFA) 108 (90 Base) MCG/ACT inhaler Inhale 2 puffs into the lungs every 6 (six) hours as needed for wheezing or shortness of breath.  Marland Kitchen. albuterol (PROVENTIL) (2.5 MG/3ML) 0.083% nebulizer solution Take 3 mLs (2.5 mg total) by nebulization 2 (two) times daily.  Marland Kitchen. aspirin EC 81 MG tablet Take 1 tablet (81 mg total) by mouth daily.  Marland Kitchen. atorvastatin (LIPITOR) 40 MG tablet Take 1 tablet (40 mg total) by mouth daily at 6 PM.  . Bioflavonoid Products (ESTER C PO) Take 1 tablet by mouth daily.  . carbamazepine (TEGRETOL XR) 100 MG 12 hr tablet Take 100-200 mg by mouth 2 (two) times daily. 2 tab qam, 2  tabs qhs  . Cholecalciferol (VITAMIN D-3) 5000 UNITS TABS Take 1 tablet by mouth daily.  Marland Kitchen. docusate sodium (COLACE) 100 MG capsule Take 100 mg by mouth at bedtime.  Marland Kitchen. esomeprazole (NEXIUM) 40 MG capsule Take 1 capsule (40 mg total) by mouth 2 (two) times daily before a meal.  . ferrous gluconate (FERGON) 324 MG tablet Take 324 mg by mouth every other day. At night  . lamoTRIgine (LAMICTAL) 200 MG tablet Take 200 mg by mouth 2 (two) times daily.  . Probiotic Product (PROBIOTIC DAILY PO) Take 1 tablet by mouth daily.  Marland Kitchen. Respiratory Therapy Supplies (FLUTTER) DEVI Use as directed   No current facility-administered medications on file prior to visit.      No Known Allergies  Review Of Systems:  Constitutional:   + Intentional   weight loss, no night sweats, + Fevers, no chills, +fatigue, or  lassitude.  HEENT:   No headaches,  Difficulty swallowing,  Tooth/dental problems, or  Sore throat,                No sneezing, itching, ear ache, nasal congestion, post nasal drip,   CV:  No chest pain,  Orthopnea, PND, swelling in lower extremities, anasarca, dizziness, palpitations, syncope.   GI  No heartburn, indigestion, abdominal pain, nausea, vomiting, diarrhea, change in bowel habits, loss of appetite, bloody stools.   Resp: + baseline shortness of breath with exertion or at rest.  + excess mucus, no productive cough,  No non-productive cough,  No coughing up of blood.  + change in color of mucus.  No wheezing.  No chest wall deformity  Skin: no rash or lesions.  GU: no dysuria, change in color of urine, no urgency or frequency.  No flank pain, no hematuria   MS:  No joint pain or swelling.  No decreased range of motion.  No back pain.  Psych:  No change in mood or affect. No depression or anxiety.  No memory loss.   Vital Signs BP 118/72 (BP Location: Left Arm, Patient Position: Sitting, Cuff Size: Normal)   Pulse (!) 59   Ht 6\' 2"  (1.88 m)   Wt 251 lb (113.9 kg)   SpO2 93%   BMI  32.23 kg/m    Physical Exam:  General- No distress,  A&Ox3, pleasant ENT: No sinus tenderness, TM clear, pale nasal mucosa, no oral exudate,no post nasal drip, no LAN Cardiac: S1, S2, regular rate and rhythm, no murmur Chest: No wheeze/ rales/ dullness; no accessory muscle use, no nasal flaring, no sternal retractions, diminished per bases with crackles  noted Abd.: Soft Non-tender, non-distended Ext: No clubbing cyanosis, edema Neuro:  normal strength Skin: No rashes, warm and dry Psych: normal mood and behavior   Assessment/Plan  Chest congestion with yellow mucus x 1 week Plan: Doxycycline 100 mg twice daily x 7 days for chest congestion Follow up as needed . Please contact office for sooner follow up if symptoms do not improve or worsen or seek emergency care   POC Off oxygen today at the office  Explained that if his oxygen requirements are > 4 L he most likely will not qualify for Imogen which he is requesting. Plan: We will walk you today to assess your oxygen needs after weight loss We will order POC and see if you qualify for use.  Bevelyn Ngo, NP 04/01/2017  11:05 AM

## 2017-04-01 NOTE — Patient Instructions (Addendum)
We will walk you today to see if you qualify for POC of choice We will place order for Intogen POC Doxycycline 100 mg twice daily x 7 days for chest congestion Follow up as needed . Please contact office for sooner follow up if symptoms do not improve or worsen or seek emergency care

## 2017-04-03 DIAGNOSIS — J84112 Idiopathic pulmonary fibrosis: Secondary | ICD-10-CM | POA: Diagnosis not present

## 2017-04-03 DIAGNOSIS — J449 Chronic obstructive pulmonary disease, unspecified: Secondary | ICD-10-CM | POA: Diagnosis not present

## 2017-04-03 DIAGNOSIS — E668 Other obesity: Secondary | ICD-10-CM | POA: Diagnosis not present

## 2017-05-02 ENCOUNTER — Other Ambulatory Visit: Payer: Self-pay | Admitting: Pulmonary Disease

## 2017-05-04 DIAGNOSIS — E668 Other obesity: Secondary | ICD-10-CM | POA: Diagnosis not present

## 2017-05-04 DIAGNOSIS — J449 Chronic obstructive pulmonary disease, unspecified: Secondary | ICD-10-CM | POA: Diagnosis not present

## 2017-05-04 DIAGNOSIS — J84112 Idiopathic pulmonary fibrosis: Secondary | ICD-10-CM | POA: Diagnosis not present

## 2017-05-21 DIAGNOSIS — J8410 Pulmonary fibrosis, unspecified: Secondary | ICD-10-CM | POA: Diagnosis not present

## 2017-05-21 DIAGNOSIS — B9689 Other specified bacterial agents as the cause of diseases classified elsewhere: Secondary | ICD-10-CM | POA: Diagnosis not present

## 2017-05-21 DIAGNOSIS — J208 Acute bronchitis due to other specified organisms: Secondary | ICD-10-CM | POA: Diagnosis not present

## 2017-05-24 DIAGNOSIS — B9689 Other specified bacterial agents as the cause of diseases classified elsewhere: Secondary | ICD-10-CM | POA: Diagnosis not present

## 2017-05-24 DIAGNOSIS — J208 Acute bronchitis due to other specified organisms: Secondary | ICD-10-CM | POA: Diagnosis not present

## 2017-05-24 DIAGNOSIS — R6889 Other general symptoms and signs: Secondary | ICD-10-CM | POA: Diagnosis not present

## 2017-05-24 DIAGNOSIS — Z8709 Personal history of other diseases of the respiratory system: Secondary | ICD-10-CM | POA: Diagnosis not present

## 2017-05-30 ENCOUNTER — Ambulatory Visit: Payer: BLUE CROSS/BLUE SHIELD | Admitting: Pulmonary Disease

## 2017-05-30 ENCOUNTER — Ambulatory Visit (INDEPENDENT_AMBULATORY_CARE_PROVIDER_SITE_OTHER)
Admission: RE | Admit: 2017-05-30 | Discharge: 2017-05-30 | Disposition: A | Discharge status: Home / Self Care | Payer: BLUE CROSS/BLUE SHIELD | Source: Ambulatory Visit | Attending: Pulmonary Disease | Admitting: Pulmonary Disease

## 2017-05-30 ENCOUNTER — Encounter: Payer: Self-pay | Admitting: Pulmonary Disease

## 2017-05-30 VITALS — BP 142/78 | HR 65 | Temp 97.1°F | Ht 74.0 in | Wt 248.0 lb

## 2017-05-30 DIAGNOSIS — Z5181 Encounter for therapeutic drug level monitoring: Secondary | ICD-10-CM | POA: Diagnosis not present

## 2017-05-30 DIAGNOSIS — J84112 Idiopathic pulmonary fibrosis: Secondary | ICD-10-CM | POA: Diagnosis not present

## 2017-05-30 DIAGNOSIS — R05 Cough: Secondary | ICD-10-CM | POA: Diagnosis not present

## 2017-05-30 DIAGNOSIS — G4733 Obstructive sleep apnea (adult) (pediatric): Secondary | ICD-10-CM | POA: Diagnosis not present

## 2017-05-30 DIAGNOSIS — J9611 Chronic respiratory failure with hypoxia: Secondary | ICD-10-CM | POA: Diagnosis not present

## 2017-05-30 DIAGNOSIS — R0602 Shortness of breath: Secondary | ICD-10-CM | POA: Diagnosis not present

## 2017-05-30 MED ORDER — ALBUTEROL SULFATE (2.5 MG/3ML) 0.083% IN NEBU: 2.5000 mg | INHALATION_SOLUTION | Freq: Two times a day (BID) | RESPIRATORY_TRACT | 11 refills | Status: AC

## 2017-05-30 MED ORDER — PREDNISONE 20 MG PO TABS: 20.0000 mg | ORAL_TABLET | Freq: Every day | ORAL | 0 refills | Status: AC

## 2017-05-30 MED ORDER — HYDROCOD POLST-CPM POLST ER 10-8 MG/5ML PO SUER: 5.0000 mL | Freq: Two times a day (BID) | ORAL | 0 refills | Status: AC | PRN

## 2017-05-30 MED ORDER — SODIUM CHLORIDE 3 % IN NEBU: INHALATION_SOLUTION | RESPIRATORY_TRACT | 11 refills | Status: DC | PRN

## 2017-05-30 NOTE — Patient Instructions (Signed)
Atypical pneumonia/viral process: Finish the doxycycline Take the prednisone I prescribed Use the hypertonic saline and albuterol twice a day for the next week Take Mucinex 1200 mg extended release twice a day Drink plenty of fluids Call me on Monday, if you are not feeling better then we will call in a prescription for Levaquin Do not inhale essential oils, we have seen many patients have problems with that  Idiopathic pulmonary fibrosis: It is not clear to me why the team at [facility] Esbriet I will research this  Chronic respiratory failure with hypoxemia: Use 4 L continuously  Follow-up two months

## 2017-05-30 NOTE — Progress Notes (Signed)
Subjective:    Patient ID: Kevin Boyd, male    DOB: [birth year], 52 y.o.   MRN: [id number]  Synopsis: First evaluated by [facility] in 2015 for usual interstitial pneumonitis found on a biopsy. He has a family history significant for pulmonary fibrosis in that his mother died and he has a brother with the disease. Serology panel in 2015 was negative for connective tissue disease.  Diagnosed with IPF with an open lung biopsy.    HPI Chief Complaint  Patient presents with  . Follow-up    c/o worsening SOB, burning in chest, fatigue, nonprod cough X2 weeks.     [name] has been doing poorly in the last few weeks.  He saw an NP at his PCP because of chest congestion and mucus productoin.  He was prescribed augmentin which he says didn't help.  He then went to his PCP and he was prescribed doxycycline.  He says that he still has some congestion, cough.  He doesn't feel any better.  He is not worse.  Flu test was negative.  He has body aches.  He has a headache.  He says that his headache which is due to his cough.  He is taking tessalon perles.    He is currently on 4L O2 continuously.  This helps. If he takes it off he can't walk very far without getting severely short of breath.    He is still working.    He is using a humidifier and a diffuser.  He says that he feels like he has fluid in his chest.  He is coughing but nothing comes out.  He feels malaise.  He still has fever and chills.  He says that this morning he felt significant chills.  He says that this morning he felt really badly this morning in particular.    He has lost 40 pounds by doing the keto diet.    He has been been    Past Medical History:  Diagnosis Date  . Asthma   . Bipolar 1 disorder (HCC)   . Borderline diabetes mellitus   . C. difficile diarrhea   . COPD (chronic obstructive pulmonary disease) (HCC)   . GERD (gastroesophageal reflux disease)   . Interstitial lung disease (HCC)   . Scoliosis   .  Seasonal allergies   . Sleep apnea       Review of Systems  Constitutional: Negative for chills and fever.  HENT: Negative for postnasal drip, rhinorrhea and sinus pressure.   Respiratory: Positive for shortness of breath. Negative for wheezing.   Cardiovascular: Negative for chest pain, palpitations and leg swelling.  Gastrointestinal: Negative for diarrhea.       Objective:   Physical Exam Vitals:   05/30/17 1031  BP: (!) 142/78  Pulse: 65  Temp: (!) 97.1 F (36.2 C)  TempSrc: Oral  SpO2: 95%  Weight: 248 lb (112.5 kg)  Height: 6\' 2"  (1.88 m)  4L  May 30, 2017: Walked 500 feet on 4L started at 96%, O2 saturation dropped to 89% by the end  Gen: mildly ill  appearing HENT: OP clear, TM's clear, neck supple PULM: Crackles bases B, normal percussion CV: RRR, no mgr, trace edema GI: BS+, soft, nontender Derm: no cyanosis or rash Psyche: normal mood and affect     6 Min Walk: 04/2014 1660 feet, O2 98% RA July 2016 6 minute walk> 372 m) 1220 feet) O2 sat 96% October 2016 6 minute walk test 479 m, O2  saturation 97%. October 2016 6 minute walk test 479 m, O2 saturation 97%. 08/2016 6 MW: 384 M, O2 sat nadir 91% RA  Imaging: 09/2002 [place] CXR showed infiltrates transiently that cleared on a follow up film one month later August 2015 CT chest> There is groundglass opacification, scattered intralobular septal thickening, traction bronchiectasis and peripheral based honeycombing. The majority of the interstitial changes are peripheral based. There does not appear to be a craniocaudal gradient 05/2015 CT sinus > normal   Labs: 01/2014 ANA, RF, SCL-70, SSA/SSB, Aldolase, Anti-Jo-1, centromere all neg  Path: 12/28/2013 open lung biopsy: pulmonary fibrosis, severe, 1 sections show honeycombing, fibroblastic foci and overall variegated appearance with subpleural accentuation fibrosis, most consistent with usual interstitial pneumonitis. Granulomas are not  seen.  Pulmonary function testing: 03/22/2014 PFT> Ratio 82%, FEV1 2.91L (74% pred, 8% pred), TLCO 4.67L (62% pred), DLCO 35.4 (45% pred) July 2016 pulmonary function testing> ratio 72%, FEV1 2.44 L (54% Pred), forced vital capacity 3.39 L (58% predicted), total lung capacity 5.48 L (70% predicted), DLCO 22.72 (60% predicted) . October 2016 pulmonary function testing ratio 88%, FEV1 3.47 L (77% predicted), total lung capacity 5.38 L (69% productive) sees, DLCO 20.55 (54% predicted) October 2017 pulmonary function testing ratio 89% FVC 3.52 L 61% predicted total lung capacity 4.66 L 59% predicted DLCO 13.31 45% predicted June 2018 ratio normal, FVC 2.89 L 50% predicted, total lung capacity 3.96 L 51% predicted, DLCO 17.34 45% predicted  BMET    Component Value Date/Time   NA 139 02/14/2016 0443   NA 140 12/29/2013 0411   K 4.2 02/14/2016 0443   K 4.1 12/29/2013 0411   CL 106 02/14/2016 0443   CL 106 12/29/2013 0411   CO2 26 02/14/2016 0443   CO2 29 12/29/2013 0411   GLUCOSE 113 (H) 02/14/2016 0443   GLUCOSE 121 (H) 12/29/2013 0411   BUN 12 02/14/2016 0443   BUN 10 12/29/2013 0411   CREATININE 0.74 02/14/2016 0443   CREATININE 0.63 08/20/2014 0813   CALCIUM 8.6 (L) 02/14/2016 0443   CALCIUM 8.0 (L) 12/29/2013 0411   GFRNONAA >60 02/14/2016 0443   GFRNONAA >89 08/20/2014 0813   GFRAA >60 02/14/2016 0443   GFRAA >89 08/20/2014 0813       Assessment & Plan:   Shortness of breath - Plan: DG Chest 2 View  IPF (idiopathic pulmonary fibrosis) (HCC)  Therapeutic drug monitoring  Chronic respiratory failure with hypoxia (HCC)  Discussion: [name] has signs and symptoms of either atypical pneumonia or a viral infection which has been persistent.  Given his underlying interstitial lung disease I want to get a chest x-ray to make sure there is no evidence of lobar pneumonia or something else we are missing.  I am going to add prednisone given his severity of symptoms and his prior  smoking history.  Because of chest congestion without mucus production we will add hypertonic saline and albuterol.  We will also giving something stronger for the cough.  He has clearly worsened in the last year.  In June 2018 during a 6-minute walk test in our office he did not need oxygen but he need it is much is 8 L at [facility] at 1.8 later in the year.  We talked about the importance of staying on top of his weight loss routine because lung transplant is clearly going to be something he needs in the next 12-18 months.  Plan: Atypical pneumonia/viral process: Finish the doxycycline Take the prednisone I prescribed Use the hypertonic  saline and albuterol twice a day for the next week Take Mucinex 1200 mg extended release twice a day Drink plenty of fluids Call me on Monday, if you are not feeling better then we will call in a prescription for Levaquin Do not inhale essential oils, we have seen many patients have problems with that   Idiopathic pulmonary fibrosis: It is not clear to me why the team at [facility] stop the Esbriet I will research this  Chronic respiratory failure with hypoxemia: Use 4 L continuously  Follow-up two months    Current Outpatient Medications:  .  albuterol (PROVENTIL HFA;VENTOLIN HFA) 108 (90 Base) MCG/ACT inhaler, Inhale 2 puffs into the lungs every 6 (six) hours as needed for wheezing or shortness of breath., Disp: 1 Inhaler, Rfl: 11 .  albuterol (PROVENTIL) (2.5 MG/3ML) 0.083% nebulizer solution, Take 3 mLs (2.5 mg total) by nebulization 2 (two) times daily., Disp: 360 mL, Rfl: 11 .  aspirin EC 81 MG tablet, Take 1 tablet (81 mg total) by mouth daily., Disp: 30 tablet, Rfl: 0 .  atorvastatin (LIPITOR) 40 MG tablet, Take 1 tablet (40 mg total) by mouth daily at 6 PM., Disp: 30 tablet, Rfl: 0 .  Bioflavonoid Products (ESTER C PO), Take 1 tablet by mouth daily., Disp: , Rfl:  .  carbamazepine (TEGRETOL XR) 100 MG 12 hr tablet, Take 100-200 mg by mouth 2 (two) times  daily. 2 tab qam, 2 tabs qhs, Disp: , Rfl:  .  Cholecalciferol (VITAMIN D-3) 5000 UNITS TABS, Take 1 tablet by mouth daily., Disp: , Rfl:  .  docusate sodium (COLACE) 100 MG capsule, Take 100 mg by mouth at bedtime., Disp: , Rfl:  .  doxycycline (VIBRA-TABS) 100 MG tablet, Take 1 tablet (100 mg total) by mouth 2 (two) times daily., Disp: 14 tablet, Rfl: 0 .  esomeprazole (NEXIUM) 40 MG capsule, TAKE 1 CAPSULE BY MOUTH 2 TIMES DAILY BEFORE A MEAL, Disp: 180 capsule, Rfl: 0 .  ferrous gluconate (FERGON) 324 MG tablet, Take 324 mg by mouth every other day. At night, Disp: , Rfl:  .  lamoTRIgine (LAMICTAL) 200 MG tablet, Take 200 mg by mouth 2 (two) times daily., Disp: , Rfl:  .  Probiotic Product (PROBIOTIC DAILY PO), Take 1 tablet by mouth daily., Disp: , Rfl:  .  Respiratory Therapy Supplies (FLUTTER) DEVI, Use as directed, Disp: 1 each, Rfl: 0 .  chlorpheniramine-HYDROcodone (TUSSIONEX PENNKINETIC ER) 10-8 MG/5ML SUER, Take 5 mLs by mouth every 12 (twelve) hours as needed for cough., Disp: 140 mL, Rfl: 0 .  predniSONE (DELTASONE) 20 MG tablet, Take 1 tablet (20 mg total) by mouth daily with breakfast., Disp: 5 tablet, Rfl: 0 .  sodium chloride HYPERTONIC 3 % nebulizer solution, Take by nebulization as needed for other., Disp: 300 mL, Rfl: 11

## 2017-06-01 DIAGNOSIS — R0602 Shortness of breath: Secondary | ICD-10-CM | POA: Diagnosis not present

## 2017-06-01 DIAGNOSIS — E668 Other obesity: Secondary | ICD-10-CM | POA: Diagnosis not present

## 2017-06-01 DIAGNOSIS — J449 Chronic obstructive pulmonary disease, unspecified: Secondary | ICD-10-CM | POA: Diagnosis not present

## 2017-06-01 DIAGNOSIS — R0989 Other specified symptoms and signs involving the circulatory and respiratory systems: Secondary | ICD-10-CM | POA: Diagnosis not present

## 2017-06-01 DIAGNOSIS — J84112 Idiopathic pulmonary fibrosis: Secondary | ICD-10-CM | POA: Diagnosis not present

## 2017-06-04 ENCOUNTER — Other Ambulatory Visit: Payer: Self-pay

## 2017-06-04 ENCOUNTER — Telehealth: Payer: Self-pay | Admitting: Pulmonary Disease

## 2017-06-04 MED ORDER — SODIUM CHLORIDE 3 % IN NEBU: INHALATION_SOLUTION | Freq: Two times a day (BID) | RESPIRATORY_TRACT | 11 refills | Status: AC

## 2017-06-04 NOTE — Telephone Encounter (Signed)
reviwed chart - he has IPF and is on many Liters O2. Given not feeling better and fever I recommend ER/Hospitalization. THis could be flare and might need IV steroids because outscomes for flare are poor. REcommend ER   Dr. Kalman ShanMurali Carmyn Hamm, M.D., [facility] Pulmonary and Critical Care Medicine Staff Physician, [facility] Center Director - Interstitial Lung Disease  Program  Pulmonary Fibrosis [facility] [place], [place], [id number]  Pager: [phone], If no answer or between  15:00h - 7:00h: call [phone] Telephone: [phone]

## 2017-06-04 NOTE — Telephone Encounter (Signed)
Pt was prescribed Prednisone 20mg  x5 days on 05/30/17. Pt states he did have some improvement with prednsione but symptoms did not completely subside.   Pt reports of Sob with exertion, wheezing, chest tightness, non prod cough, temp of 101 & chills x2w. Pt taking albuterol nebs bid with mild improvement.    MR please advise, as BQ is on vacation. Thanks

## 2017-06-04 NOTE — Telephone Encounter (Signed)
Called pt and advised message from the provider. Pt understood and verbalized understanding. Nothing further is needed.    

## 2017-06-10 DIAGNOSIS — Z79899 Other long term (current) drug therapy: Secondary | ICD-10-CM | POA: Diagnosis not present

## 2017-06-10 DIAGNOSIS — J8410 Pulmonary fibrosis, unspecified: Secondary | ICD-10-CM | POA: Diagnosis not present

## 2017-06-10 DIAGNOSIS — Z01818 Encounter for other preprocedural examination: Secondary | ICD-10-CM | POA: Diagnosis not present

## 2017-06-10 DIAGNOSIS — R9431 Abnormal electrocardiogram [ECG] [EKG]: Secondary | ICD-10-CM | POA: Diagnosis not present

## 2017-06-10 DIAGNOSIS — E782 Mixed hyperlipidemia: Secondary | ICD-10-CM | POA: Diagnosis not present

## 2017-06-10 DIAGNOSIS — J84112 Idiopathic pulmonary fibrosis: Secondary | ICD-10-CM | POA: Diagnosis not present

## 2017-06-10 DIAGNOSIS — R918 Other nonspecific abnormal finding of lung field: Secondary | ICD-10-CM | POA: Diagnosis not present

## 2017-06-10 DIAGNOSIS — J449 Chronic obstructive pulmonary disease, unspecified: Secondary | ICD-10-CM | POA: Diagnosis not present

## 2017-06-10 DIAGNOSIS — Z7682 Awaiting organ transplant status: Secondary | ICD-10-CM | POA: Diagnosis not present

## 2017-06-10 DIAGNOSIS — F319 Bipolar disorder, unspecified: Secondary | ICD-10-CM | POA: Diagnosis not present

## 2017-06-10 DIAGNOSIS — Z87891 Personal history of nicotine dependence: Secondary | ICD-10-CM | POA: Diagnosis not present

## 2017-06-11 DIAGNOSIS — Z23 Encounter for immunization: Secondary | ICD-10-CM | POA: Diagnosis not present

## 2017-06-11 DIAGNOSIS — E669 Obesity, unspecified: Secondary | ICD-10-CM | POA: Diagnosis not present

## 2017-06-11 DIAGNOSIS — R262 Difficulty in walking, not elsewhere classified: Secondary | ICD-10-CM | POA: Diagnosis not present

## 2017-06-11 DIAGNOSIS — Z87891 Personal history of nicotine dependence: Secondary | ICD-10-CM | POA: Diagnosis not present

## 2017-06-11 DIAGNOSIS — Z01818 Encounter for other preprocedural examination: Secondary | ICD-10-CM | POA: Diagnosis not present

## 2017-06-11 DIAGNOSIS — Z7682 Awaiting organ transplant status: Secondary | ICD-10-CM | POA: Diagnosis not present

## 2017-06-11 DIAGNOSIS — J9611 Chronic respiratory failure with hypoxia: Secondary | ICD-10-CM | POA: Diagnosis not present

## 2017-06-11 DIAGNOSIS — I6522 Occlusion and stenosis of left carotid artery: Secondary | ICD-10-CM | POA: Diagnosis not present

## 2017-06-11 DIAGNOSIS — J849 Interstitial pulmonary disease, unspecified: Secondary | ICD-10-CM | POA: Diagnosis not present

## 2017-06-11 DIAGNOSIS — F319 Bipolar disorder, unspecified: Secondary | ICD-10-CM | POA: Diagnosis not present

## 2017-06-12 DIAGNOSIS — Z008 Encounter for other general examination: Secondary | ICD-10-CM | POA: Diagnosis not present

## 2017-06-12 DIAGNOSIS — I517 Cardiomegaly: Secondary | ICD-10-CM | POA: Diagnosis not present

## 2017-06-12 DIAGNOSIS — F54 Psychological and behavioral factors associated with disorders or diseases classified elsewhere: Secondary | ICD-10-CM | POA: Diagnosis not present

## 2017-06-12 DIAGNOSIS — F3110 Bipolar disorder, current episode manic without psychotic features, unspecified: Secondary | ICD-10-CM | POA: Diagnosis not present

## 2017-06-12 DIAGNOSIS — Z7682 Awaiting organ transplant status: Secondary | ICD-10-CM | POA: Diagnosis not present

## 2017-06-13 DIAGNOSIS — I6521 Occlusion and stenosis of right carotid artery: Secondary | ICD-10-CM | POA: Diagnosis not present

## 2017-06-13 DIAGNOSIS — I7789 Other specified disorders of arteries and arterioles: Secondary | ICD-10-CM | POA: Diagnosis not present

## 2017-06-13 DIAGNOSIS — Z01810 Encounter for preprocedural cardiovascular examination: Secondary | ICD-10-CM | POA: Diagnosis not present

## 2017-06-13 DIAGNOSIS — Z01818 Encounter for other preprocedural examination: Secondary | ICD-10-CM | POA: Diagnosis not present

## 2017-06-13 DIAGNOSIS — Z7682 Awaiting organ transplant status: Secondary | ICD-10-CM | POA: Diagnosis not present

## 2017-06-13 DIAGNOSIS — I6522 Occlusion and stenosis of left carotid artery: Secondary | ICD-10-CM | POA: Diagnosis not present

## 2017-06-13 DIAGNOSIS — J8410 Pulmonary fibrosis, unspecified: Secondary | ICD-10-CM | POA: Diagnosis not present

## 2017-06-13 DIAGNOSIS — I6523 Occlusion and stenosis of bilateral carotid arteries: Secondary | ICD-10-CM | POA: Diagnosis not present

## 2017-06-13 DIAGNOSIS — I700 Atherosclerosis of aorta: Secondary | ICD-10-CM | POA: Diagnosis not present

## 2017-06-13 DIAGNOSIS — R93421 Abnormal radiologic findings on diagnostic imaging of right kidney: Secondary | ICD-10-CM | POA: Diagnosis not present

## 2017-06-14 DIAGNOSIS — J8410 Pulmonary fibrosis, unspecified: Secondary | ICD-10-CM | POA: Diagnosis not present

## 2017-06-14 DIAGNOSIS — Z7682 Awaiting organ transplant status: Secondary | ICD-10-CM | POA: Diagnosis not present

## 2017-06-14 DIAGNOSIS — J9691 Respiratory failure, unspecified with hypoxia: Secondary | ICD-10-CM | POA: Diagnosis not present

## 2017-06-14 DIAGNOSIS — Z87891 Personal history of nicotine dependence: Secondary | ICD-10-CM | POA: Diagnosis not present

## 2017-06-14 DIAGNOSIS — Z7982 Long term (current) use of aspirin: Secondary | ICD-10-CM | POA: Diagnosis not present

## 2017-06-14 DIAGNOSIS — Z01810 Encounter for preprocedural cardiovascular examination: Secondary | ICD-10-CM | POA: Diagnosis not present

## 2017-06-14 DIAGNOSIS — E669 Obesity, unspecified: Secondary | ICD-10-CM | POA: Diagnosis not present

## 2017-06-14 DIAGNOSIS — G4733 Obstructive sleep apnea (adult) (pediatric): Secondary | ICD-10-CM | POA: Diagnosis not present

## 2017-06-14 DIAGNOSIS — E785 Hyperlipidemia, unspecified: Secondary | ICD-10-CM | POA: Diagnosis not present

## 2017-06-17 DIAGNOSIS — Z79899 Other long term (current) drug therapy: Secondary | ICD-10-CM | POA: Diagnosis not present

## 2017-06-17 DIAGNOSIS — F319 Bipolar disorder, unspecified: Secondary | ICD-10-CM | POA: Diagnosis not present

## 2017-06-20 DIAGNOSIS — Z23 Encounter for immunization: Secondary | ICD-10-CM | POA: Diagnosis not present

## 2017-06-24 DIAGNOSIS — R262 Difficulty in walking, not elsewhere classified: Secondary | ICD-10-CM | POA: Diagnosis not present

## 2017-06-24 DIAGNOSIS — Z7682 Awaiting organ transplant status: Secondary | ICD-10-CM | POA: Diagnosis not present

## 2017-06-25 DIAGNOSIS — R262 Difficulty in walking, not elsewhere classified: Secondary | ICD-10-CM | POA: Diagnosis not present

## 2017-06-25 DIAGNOSIS — Z7682 Awaiting organ transplant status: Secondary | ICD-10-CM | POA: Diagnosis not present

## 2017-06-26 DIAGNOSIS — Z7682 Awaiting organ transplant status: Secondary | ICD-10-CM | POA: Diagnosis not present

## 2017-06-26 DIAGNOSIS — R262 Difficulty in walking, not elsewhere classified: Secondary | ICD-10-CM | POA: Diagnosis not present

## 2017-06-27 DIAGNOSIS — R262 Difficulty in walking, not elsewhere classified: Secondary | ICD-10-CM | POA: Diagnosis not present

## 2017-06-27 DIAGNOSIS — Z7682 Awaiting organ transplant status: Secondary | ICD-10-CM | POA: Diagnosis not present

## 2017-06-28 DIAGNOSIS — R262 Difficulty in walking, not elsewhere classified: Secondary | ICD-10-CM | POA: Diagnosis not present

## 2017-06-28 DIAGNOSIS — Z7682 Awaiting organ transplant status: Secondary | ICD-10-CM | POA: Diagnosis not present

## 2017-07-01 DIAGNOSIS — R262 Difficulty in walking, not elsewhere classified: Secondary | ICD-10-CM | POA: Diagnosis not present

## 2017-07-01 DIAGNOSIS — R1312 Dysphagia, oropharyngeal phase: Secondary | ICD-10-CM | POA: Diagnosis not present

## 2017-07-02 DIAGNOSIS — J84112 Idiopathic pulmonary fibrosis: Secondary | ICD-10-CM | POA: Diagnosis not present

## 2017-07-02 DIAGNOSIS — R0602 Shortness of breath: Secondary | ICD-10-CM | POA: Diagnosis not present

## 2017-07-02 DIAGNOSIS — R0989 Other specified symptoms and signs involving the circulatory and respiratory systems: Secondary | ICD-10-CM | POA: Diagnosis not present

## 2017-07-02 DIAGNOSIS — J449 Chronic obstructive pulmonary disease, unspecified: Secondary | ICD-10-CM | POA: Diagnosis not present

## 2017-07-02 DIAGNOSIS — E668 Other obesity: Secondary | ICD-10-CM | POA: Diagnosis not present

## 2017-07-02 DIAGNOSIS — R262 Difficulty in walking, not elsewhere classified: Secondary | ICD-10-CM | POA: Diagnosis not present

## 2017-07-03 DIAGNOSIS — F54 Psychological and behavioral factors associated with disorders or diseases classified elsewhere: Secondary | ICD-10-CM | POA: Diagnosis not present

## 2017-07-03 DIAGNOSIS — R262 Difficulty in walking, not elsewhere classified: Secondary | ICD-10-CM | POA: Diagnosis not present

## 2017-07-03 DIAGNOSIS — R1312 Dysphagia, oropharyngeal phase: Secondary | ICD-10-CM | POA: Diagnosis not present

## 2017-07-03 DIAGNOSIS — Z7682 Awaiting organ transplant status: Secondary | ICD-10-CM | POA: Diagnosis not present

## 2017-07-03 DIAGNOSIS — Z008 Encounter for other general examination: Secondary | ICD-10-CM | POA: Diagnosis not present

## 2017-07-03 DIAGNOSIS — F3110 Bipolar disorder, current episode manic without psychotic features, unspecified: Secondary | ICD-10-CM | POA: Diagnosis not present

## 2017-07-04 DIAGNOSIS — R262 Difficulty in walking, not elsewhere classified: Secondary | ICD-10-CM | POA: Diagnosis not present

## 2017-07-05 DIAGNOSIS — R1312 Dysphagia, oropharyngeal phase: Secondary | ICD-10-CM | POA: Diagnosis not present

## 2017-07-05 DIAGNOSIS — R262 Difficulty in walking, not elsewhere classified: Secondary | ICD-10-CM | POA: Diagnosis not present

## 2017-07-08 DIAGNOSIS — R262 Difficulty in walking, not elsewhere classified: Secondary | ICD-10-CM | POA: Diagnosis not present

## 2017-07-09 DIAGNOSIS — R262 Difficulty in walking, not elsewhere classified: Secondary | ICD-10-CM | POA: Diagnosis not present

## 2017-07-10 ENCOUNTER — Telehealth: Payer: Self-pay | Admitting: Pulmonary Disease

## 2017-07-10 DIAGNOSIS — R262 Difficulty in walking, not elsewhere classified: Secondary | ICD-10-CM | POA: Diagnosis not present

## 2017-07-10 NOTE — Telephone Encounter (Signed)
Spoke with Vira Blancoona at [facility]  She states that the pt's CPAP was ordered by Dr.  Dr. Marisue IvanKanhka Linthavong He has been on CPAP since 2015 Their airview system shows that from Sept 2017 to Sep 2018, he only used the CPAP for a total of 5 days  We can't do a letter for him stating he has been using the machine if he is being non compliant  LMTCB for the pt

## 2017-07-10 NOTE — Telephone Encounter (Signed)
Attempted to call the pt. I did not receive an answer. I have left a message for the pt to return our call.  

## 2017-07-10 NOTE — Telephone Encounter (Signed)
Kevin LitesDonna, [facility] states the sleep study from 2013 was ordered by Dr. Marisue IvanKanhka Linthavong.  Cb is [phone]

## 2017-07-10 NOTE — Telephone Encounter (Signed)
Spoke with the pt  He states needing letter sent to [facility] stating that he has been using his CPAP    He states that they keep billing his insurance  He states he does not has been on CPAP for years and he does not know who prescribed this for him  He does not have anyone that is managing his CPAP compliance  LMTCB for [facility] in [place] [phone] to find out who prescribed CPAP, and if they can send DL

## 2017-07-10 NOTE — Telephone Encounter (Signed)
Called and spoke to patient. Patient stated the he keeps getting supplies sent and billed to his insurance and he does no longer use the CPAP. He is going to follow up with his PCP who ordered the CPAP and have it all discontinued. Patient reports that there is nothing further needed at this time.

## 2017-07-10 NOTE — Telephone Encounter (Signed)
Pt is calling back [phone] 

## 2017-07-10 NOTE — Telephone Encounter (Signed)
Pt is calling back [phone]

## 2017-07-11 DIAGNOSIS — K635 Polyp of colon: Secondary | ICD-10-CM | POA: Diagnosis not present

## 2017-07-11 DIAGNOSIS — Z7682 Awaiting organ transplant status: Secondary | ICD-10-CM | POA: Diagnosis not present

## 2017-07-11 DIAGNOSIS — Z1211 Encounter for screening for malignant neoplasm of colon: Secondary | ICD-10-CM | POA: Diagnosis not present

## 2017-07-11 DIAGNOSIS — R262 Difficulty in walking, not elsewhere classified: Secondary | ICD-10-CM | POA: Diagnosis not present

## 2017-07-12 DIAGNOSIS — R262 Difficulty in walking, not elsewhere classified: Secondary | ICD-10-CM | POA: Diagnosis not present

## 2017-07-15 ENCOUNTER — Ambulatory Visit: Payer: BLUE CROSS/BLUE SHIELD | Admitting: Pulmonary Disease

## 2017-07-15 NOTE — Progress Notes (Deleted)
Subjective:    Patient ID: Kevin Boyd, male    DOB: 09-18-1965, 52 y.o.   MRN: [id number]  Synopsis: First evaluated by  pulmonary in 2015 for usual interstitial pneumonitis found on a biopsy. He has a family history significant for pulmonary fibrosis in that his mother died and he has a brother with the disease. Serology panel in 2015 was negative for connective tissue disease.  Diagnosed with IPF with an open lung biopsy.    HPI No chief complaint on file.  ***last visit [facility] for infection, [facility] tx follow up   Past Medical History:  Diagnosis Date  . Asthma   . Bipolar 1 disorder (HCC)   . Borderline diabetes mellitus   . C. difficile diarrhea   . COPD (chronic obstructive pulmonary disease) (HCC)   . GERD (gastroesophageal reflux disease)   . Interstitial lung disease (HCC)   . Scoliosis   . Seasonal allergies   . Sleep apnea       Review of Systems  Constitutional: Negative for chills and fever.  HENT: Negative for postnasal drip, rhinorrhea and sinus pressure.   Respiratory: Positive for shortness of breath. Negative for wheezing.   Cardiovascular: Negative for chest pain, palpitations and leg swelling.  Gastrointestinal: Negative for diarrhea.       Objective:   Physical Exam There were no vitals filed for this visit.4L  May 30, 2017: Walked 500 feet on 4L started at 96%, O2 saturation dropped to 89% by the end  ***     6 Min Walk: 04/2014 6MW 1660 feet, O2 98% RA July 2016 6 minute walk> 372 m) 1220 feet) O2 sat 96% October 2016 6 minute walk test 479 m, O2 saturation 97%. October 2016 6 minute walk test 479 m, O2 saturation 97%. 08/2016 6 MW: 384 M, O2 sat nadir 91% RA  Imaging: 09/2002 [place] CXR showed infiltrates transiently that cleared on a follow up film one month later August 2015 CT chest> There is groundglass opacification, scattered intralobular septal thickening, traction bronchiectasis and peripheral based honeycombing. The  majority of the interstitial changes are peripheral based. There does not appear to be a craniocaudal gradient 05/2015 CT sinus > normal   Labs: 01/2014 ANA, RF, SCL-70, SSA/SSB, Aldolase, Anti-Jo-1, centromere all neg  Path: 12/28/2013 open lung biopsy: pulmonary fibrosis, severe, 1 sections show honeycombing, fibroblastic foci and overall variegated appearance with subpleural accentuation fibrosis, most consistent with usual interstitial pneumonitis. Granulomas are not seen.  Pulmonary function testing: 03/22/2014 PFT> Ratio 82%, FEV1 2.91L (74% pred, 8% pred), TLCO 4.67L (62% pred), DLCO 35.4 (45% pred) July 2016 pulmonary function testing> ratio 72%, FEV1 2.44 L (54% Pred), forced vital capacity 3.39 L (58% predicted), total lung capacity 5.48 L (70% predicted), DLCO 22.72 (60% predicted) . October 2016 pulmonary function testing ratio 88%, FEV1 3.47 L (77% predicted), total lung capacity 5.38 L (69% productive) sees, DLCO 20.55 (54% predicted) October 2017 pulmonary function testing ratio 89% FVC 3.52 L 61% predicted total lung capacity 4.66 L 59% predicted DLCO 13.31 45% predicted June 2018 ratio normal, FVC 2.89 L 50% predicted, total lung capacity 3.96 L 51% predicted, DLCO 17.34 45% predicted  BMET    Component Value Date/Time   NA 139 02/14/2016 0443   NA 140 12/29/2013 0411   K 4.2 02/14/2016 0443   K 4.1 12/29/2013 0411   CL 106 02/14/2016 0443   CL 106 12/29/2013 0411   CO2 26 02/14/2016 0443   CO2 29 12/29/2013 0411  GLUCOSE 113 (H) 02/14/2016 0443   GLUCOSE 121 (H) 12/29/2013 0411   BUN 12 02/14/2016 0443   BUN 10 12/29/2013 0411   CREATININE 0.74 02/14/2016 0443   CREATININE 0.63 08/20/2014 0813   CALCIUM 8.6 (L) 02/14/2016 0443   CALCIUM 8.0 (L) 12/29/2013 0411   GFRNONAA >60 02/14/2016 0443   GFRNONAA >89 08/20/2014 0813   GFRAA >60 02/14/2016 0443   GFRAA >89 08/20/2014 0813       Assessment & Plan:   No diagnosis found.  ***   Current Outpatient  Medications:  .  albuterol (PROVENTIL HFA;VENTOLIN HFA) 108 (90 Base) MCG/ACT inhaler, Inhale 2 puffs into the lungs every 6 (six) hours as needed for wheezing or shortness of breath., Disp: 1 Inhaler, Rfl: 11 .  albuterol (PROVENTIL) (2.5 MG/3ML) 0.083% nebulizer solution, Take 3 mLs (2.5 mg total) by nebulization 2 (two) times daily., Disp: 360 mL, Rfl: 11 .  aspirin EC 81 MG tablet, Take 1 tablet (81 mg total) by mouth daily., Disp: 30 tablet, Rfl: 0 .  atorvastatin (LIPITOR) 40 MG tablet, Take 1 tablet (40 mg total) by mouth daily at 6 PM., Disp: 30 tablet, Rfl: 0 .  Bioflavonoid Products (ESTER C PO), Take 1 tablet by mouth daily., Disp: , Rfl:  .  carbamazepine (TEGRETOL XR) 100 MG 12 hr tablet, Take 100-200 mg by mouth 2 (two) times daily. 2 tab qam, 2 tabs qhs, Disp: , Rfl:  .  chlorpheniramine-HYDROcodone (TUSSIONEX PENNKINETIC ER) 10-8 MG/5ML SUER, Take 5 mLs by mouth every 12 (twelve) hours as needed for cough., Disp: 140 mL, Rfl: 0 .  Cholecalciferol (VITAMIN D-3) 5000 UNITS TABS, Take 1 tablet by mouth daily., Disp: , Rfl:  .  docusate sodium (COLACE) 100 MG capsule, Take 100 mg by mouth at bedtime., Disp: , Rfl:  .  doxycycline (VIBRA-TABS) 100 MG tablet, Take 1 tablet (100 mg total) by mouth 2 (two) times daily., Disp: 14 tablet, Rfl: 0 .  esomeprazole (NEXIUM) 40 MG capsule, TAKE 1 CAPSULE BY MOUTH 2 TIMES DAILY BEFORE A MEAL, Disp: 180 capsule, Rfl: 0 .  ferrous gluconate (FERGON) 324 MG tablet, Take 324 mg by mouth every other day. At night, Disp: , Rfl:  .  lamoTRIgine (LAMICTAL) 200 MG tablet, Take 200 mg by mouth 2 (two) times daily., Disp: , Rfl:  .  predniSONE (DELTASONE) 20 MG tablet, Take 1 tablet (20 mg total) by mouth daily with breakfast., Disp: 5 tablet, Rfl: 0 .  Probiotic Product (PROBIOTIC DAILY PO), Take 1 tablet by mouth daily., Disp: , Rfl:  .  Respiratory Therapy Supplies (FLUTTER) DEVI, Use as directed, Disp: 1 each, Rfl: 0 .  sodium chloride HYPERTONIC 3 %  nebulizer solution, Take by nebulization 2 (two) times daily., Disp: 300 mL, Rfl: 11

## 2017-07-16 DIAGNOSIS — R262 Difficulty in walking, not elsewhere classified: Secondary | ICD-10-CM | POA: Diagnosis not present

## 2017-07-17 DIAGNOSIS — R262 Difficulty in walking, not elsewhere classified: Secondary | ICD-10-CM | POA: Diagnosis not present

## 2017-07-18 DIAGNOSIS — R262 Difficulty in walking, not elsewhere classified: Secondary | ICD-10-CM | POA: Diagnosis not present

## 2017-07-18 DIAGNOSIS — F319 Bipolar disorder, unspecified: Secondary | ICD-10-CM | POA: Diagnosis not present

## 2017-07-18 DIAGNOSIS — Z7682 Awaiting organ transplant status: Secondary | ICD-10-CM | POA: Diagnosis not present

## 2017-07-19 DIAGNOSIS — R262 Difficulty in walking, not elsewhere classified: Secondary | ICD-10-CM | POA: Diagnosis not present

## 2017-07-23 DIAGNOSIS — Z23 Encounter for immunization: Secondary | ICD-10-CM | POA: Diagnosis not present

## 2017-07-23 DIAGNOSIS — J984 Other disorders of lung: Secondary | ICD-10-CM | POA: Diagnosis not present

## 2017-07-23 DIAGNOSIS — Z01818 Encounter for other preprocedural examination: Secondary | ICD-10-CM | POA: Diagnosis not present

## 2017-07-23 DIAGNOSIS — Z6832 Body mass index (BMI) 32.0-32.9, adult: Secondary | ICD-10-CM | POA: Diagnosis not present

## 2017-07-23 DIAGNOSIS — Z87891 Personal history of nicotine dependence: Secondary | ICD-10-CM | POA: Diagnosis not present

## 2017-07-23 DIAGNOSIS — J8410 Pulmonary fibrosis, unspecified: Secondary | ICD-10-CM | POA: Diagnosis not present

## 2017-07-23 DIAGNOSIS — Z713 Dietary counseling and surveillance: Secondary | ICD-10-CM | POA: Diagnosis not present

## 2017-07-23 DIAGNOSIS — J449 Chronic obstructive pulmonary disease, unspecified: Secondary | ICD-10-CM | POA: Diagnosis not present

## 2017-07-23 DIAGNOSIS — Z902 Acquired absence of lung [part of]: Secondary | ICD-10-CM | POA: Diagnosis not present

## 2017-07-23 DIAGNOSIS — Z7682 Awaiting organ transplant status: Secondary | ICD-10-CM | POA: Diagnosis not present

## 2017-07-24 DIAGNOSIS — F1421 Cocaine dependence, in remission: Secondary | ICD-10-CM | POA: Diagnosis not present

## 2017-07-24 DIAGNOSIS — Z7982 Long term (current) use of aspirin: Secondary | ICD-10-CM | POA: Diagnosis not present

## 2017-07-24 DIAGNOSIS — Z9981 Dependence on supplemental oxygen: Secondary | ICD-10-CM | POA: Diagnosis not present

## 2017-07-24 DIAGNOSIS — Z87891 Personal history of nicotine dependence: Secondary | ICD-10-CM | POA: Diagnosis not present

## 2017-07-24 DIAGNOSIS — R0602 Shortness of breath: Secondary | ICD-10-CM | POA: Diagnosis not present

## 2017-07-24 DIAGNOSIS — Z01818 Encounter for other preprocedural examination: Secondary | ICD-10-CM | POA: Diagnosis not present

## 2017-07-24 DIAGNOSIS — E669 Obesity, unspecified: Secondary | ICD-10-CM | POA: Diagnosis not present

## 2017-07-24 DIAGNOSIS — J8410 Pulmonary fibrosis, unspecified: Secondary | ICD-10-CM | POA: Diagnosis not present

## 2017-07-24 DIAGNOSIS — Z79899 Other long term (current) drug therapy: Secondary | ICD-10-CM | POA: Diagnosis not present

## 2017-07-24 DIAGNOSIS — Z5181 Encounter for therapeutic drug level monitoring: Secondary | ICD-10-CM | POA: Diagnosis not present

## 2017-07-24 DIAGNOSIS — Z6831 Body mass index (BMI) 31.0-31.9, adult: Secondary | ICD-10-CM | POA: Diagnosis not present

## 2017-07-24 DIAGNOSIS — F319 Bipolar disorder, unspecified: Secondary | ICD-10-CM | POA: Diagnosis not present

## 2017-07-24 DIAGNOSIS — I6522 Occlusion and stenosis of left carotid artery: Secondary | ICD-10-CM | POA: Diagnosis not present

## 2017-07-24 DIAGNOSIS — Z7682 Awaiting organ transplant status: Secondary | ICD-10-CM | POA: Diagnosis not present

## 2017-07-26 ENCOUNTER — Other Ambulatory Visit: Payer: Self-pay | Admitting: Pulmonary Disease

## 2017-07-26 DIAGNOSIS — R262 Difficulty in walking, not elsewhere classified: Secondary | ICD-10-CM | POA: Diagnosis not present

## 2017-07-27 DIAGNOSIS — Z4824 Encounter for aftercare following lung transplant: Secondary | ICD-10-CM | POA: Diagnosis not present

## 2017-07-27 DIAGNOSIS — J90 Pleural effusion, not elsewhere classified: Secondary | ICD-10-CM | POA: Diagnosis not present

## 2017-07-27 DIAGNOSIS — Z5181 Encounter for therapeutic drug level monitoring: Secondary | ICD-10-CM | POA: Diagnosis not present

## 2017-07-27 DIAGNOSIS — J948 Other specified pleural conditions: Secondary | ICD-10-CM | POA: Diagnosis not present

## 2017-07-27 DIAGNOSIS — Z4682 Encounter for fitting and adjustment of non-vascular catheter: Secondary | ICD-10-CM | POA: Diagnosis not present

## 2017-07-27 DIAGNOSIS — E8770 Fluid overload, unspecified: Secondary | ICD-10-CM | POA: Diagnosis not present

## 2017-07-27 DIAGNOSIS — G8918 Other acute postprocedural pain: Secondary | ICD-10-CM | POA: Diagnosis not present

## 2017-07-27 DIAGNOSIS — R0602 Shortness of breath: Secondary | ICD-10-CM | POA: Diagnosis not present

## 2017-07-27 DIAGNOSIS — F319 Bipolar disorder, unspecified: Secondary | ICD-10-CM | POA: Diagnosis not present

## 2017-07-27 DIAGNOSIS — R05 Cough: Secondary | ICD-10-CM | POA: Diagnosis not present

## 2017-07-27 DIAGNOSIS — E668 Other obesity: Secondary | ICD-10-CM | POA: Diagnosis not present

## 2017-07-27 DIAGNOSIS — Z9189 Other specified personal risk factors, not elsewhere classified: Secondary | ICD-10-CM | POA: Diagnosis not present

## 2017-07-27 DIAGNOSIS — Z4889 Encounter for other specified surgical aftercare: Secondary | ICD-10-CM | POA: Diagnosis not present

## 2017-07-27 DIAGNOSIS — G8912 Acute post-thoracotomy pain: Secondary | ICD-10-CM | POA: Diagnosis not present

## 2017-07-27 DIAGNOSIS — R739 Hyperglycemia, unspecified: Secondary | ICD-10-CM | POA: Diagnosis not present

## 2017-07-27 DIAGNOSIS — Z9889 Other specified postprocedural states: Secondary | ICD-10-CM | POA: Diagnosis not present

## 2017-07-27 DIAGNOSIS — J84112 Idiopathic pulmonary fibrosis: Secondary | ICD-10-CM | POA: Diagnosis not present

## 2017-07-27 DIAGNOSIS — T86819 Unspecified complication of lung transplant: Secondary | ICD-10-CM | POA: Diagnosis not present

## 2017-07-27 DIAGNOSIS — E46 Unspecified protein-calorie malnutrition: Secondary | ICD-10-CM | POA: Diagnosis not present

## 2017-07-27 DIAGNOSIS — D899 Disorder involving the immune mechanism, unspecified: Secondary | ICD-10-CM | POA: Diagnosis not present

## 2017-07-27 DIAGNOSIS — K5900 Constipation, unspecified: Secondary | ICD-10-CM | POA: Diagnosis not present

## 2017-07-27 DIAGNOSIS — J449 Chronic obstructive pulmonary disease, unspecified: Secondary | ICD-10-CM | POA: Diagnosis not present

## 2017-07-27 DIAGNOSIS — Z4659 Encounter for fitting and adjustment of other gastrointestinal appliance and device: Secondary | ICD-10-CM | POA: Diagnosis not present

## 2017-07-27 DIAGNOSIS — R7303 Prediabetes: Secondary | ICD-10-CM | POA: Diagnosis not present

## 2017-07-27 DIAGNOSIS — R918 Other nonspecific abnormal finding of lung field: Secondary | ICD-10-CM | POA: Diagnosis not present

## 2017-07-27 DIAGNOSIS — R0789 Other chest pain: Secondary | ICD-10-CM | POA: Diagnosis not present

## 2017-07-27 DIAGNOSIS — Z942 Lung transplant status: Secondary | ICD-10-CM | POA: Diagnosis not present

## 2017-07-27 DIAGNOSIS — R262 Difficulty in walking, not elsewhere classified: Secondary | ICD-10-CM | POA: Diagnosis not present

## 2017-07-27 DIAGNOSIS — R000 Tachycardia, unspecified: Secondary | ICD-10-CM | POA: Diagnosis not present

## 2017-07-27 DIAGNOSIS — K219 Gastro-esophageal reflux disease without esophagitis: Secondary | ICD-10-CM | POA: Diagnosis not present

## 2017-07-27 DIAGNOSIS — Z7982 Long term (current) use of aspirin: Secondary | ICD-10-CM | POA: Diagnosis not present

## 2017-07-27 DIAGNOSIS — E118 Type 2 diabetes mellitus with unspecified complications: Secondary | ICD-10-CM | POA: Diagnosis not present

## 2017-07-27 DIAGNOSIS — Z006 Encounter for examination for normal comparison and control in clinical research program: Secondary | ICD-10-CM | POA: Diagnosis not present

## 2017-07-27 DIAGNOSIS — T380X5A Adverse effect of glucocorticoids and synthetic analogues, initial encounter: Secondary | ICD-10-CM | POA: Diagnosis not present

## 2017-07-27 DIAGNOSIS — R0601 Orthopnea: Secondary | ICD-10-CM | POA: Diagnosis not present

## 2017-07-27 DIAGNOSIS — J8410 Pulmonary fibrosis, unspecified: Secondary | ICD-10-CM | POA: Diagnosis not present

## 2017-07-27 DIAGNOSIS — Z7952 Long term (current) use of systemic steroids: Secondary | ICD-10-CM | POA: Diagnosis not present

## 2017-07-27 DIAGNOSIS — E7849 Other hyperlipidemia: Secondary | ICD-10-CM | POA: Diagnosis not present

## 2017-07-27 DIAGNOSIS — E1165 Type 2 diabetes mellitus with hyperglycemia: Secondary | ICD-10-CM | POA: Diagnosis not present

## 2017-07-27 DIAGNOSIS — Z7682 Awaiting organ transplant status: Secondary | ICD-10-CM | POA: Diagnosis not present

## 2017-07-27 DIAGNOSIS — Z87891 Personal history of nicotine dependence: Secondary | ICD-10-CM | POA: Diagnosis not present

## 2017-07-27 DIAGNOSIS — G4733 Obstructive sleep apnea (adult) (pediatric): Secondary | ICD-10-CM | POA: Diagnosis not present

## 2017-07-27 DIAGNOSIS — F5101 Primary insomnia: Secondary | ICD-10-CM | POA: Diagnosis not present

## 2017-07-27 DIAGNOSIS — J939 Pneumothorax, unspecified: Secondary | ICD-10-CM | POA: Diagnosis not present

## 2017-07-27 DIAGNOSIS — J811 Chronic pulmonary edema: Secondary | ICD-10-CM | POA: Diagnosis not present

## 2017-07-27 DIAGNOSIS — R1312 Dysphagia, oropharyngeal phase: Secondary | ICD-10-CM | POA: Diagnosis not present

## 2017-07-27 DIAGNOSIS — J9811 Atelectasis: Secondary | ICD-10-CM | POA: Diagnosis not present

## 2017-07-27 DIAGNOSIS — Z79899 Other long term (current) drug therapy: Secondary | ICD-10-CM | POA: Diagnosis not present

## 2017-07-27 DIAGNOSIS — F3181 Bipolar II disorder: Secondary | ICD-10-CM | POA: Diagnosis not present

## 2017-07-27 DIAGNOSIS — G4700 Insomnia, unspecified: Secondary | ICD-10-CM | POA: Diagnosis not present

## 2017-07-27 DIAGNOSIS — R0989 Other specified symptoms and signs involving the circulatory and respiratory systems: Secondary | ICD-10-CM | POA: Diagnosis not present

## 2017-07-27 DIAGNOSIS — T86818 Other complications of lung transplant: Secondary | ICD-10-CM | POA: Diagnosis not present

## 2017-07-27 DIAGNOSIS — R5381 Other malaise: Secondary | ICD-10-CM | POA: Diagnosis not present

## 2017-07-27 DIAGNOSIS — J9610 Chronic respiratory failure, unspecified whether with hypoxia or hypercapnia: Secondary | ICD-10-CM | POA: Diagnosis not present

## 2017-07-28 DIAGNOSIS — Z7682 Awaiting organ transplant status: Secondary | ICD-10-CM | POA: Diagnosis not present

## 2017-07-28 DIAGNOSIS — K219 Gastro-esophageal reflux disease without esophagitis: Secondary | ICD-10-CM | POA: Diagnosis not present

## 2017-07-28 DIAGNOSIS — R918 Other nonspecific abnormal finding of lung field: Secondary | ICD-10-CM | POA: Diagnosis not present

## 2017-07-28 DIAGNOSIS — J449 Chronic obstructive pulmonary disease, unspecified: Secondary | ICD-10-CM | POA: Diagnosis not present

## 2017-07-28 DIAGNOSIS — J84112 Idiopathic pulmonary fibrosis: Secondary | ICD-10-CM | POA: Diagnosis not present

## 2017-07-28 DIAGNOSIS — J8410 Pulmonary fibrosis, unspecified: Secondary | ICD-10-CM | POA: Diagnosis not present

## 2017-07-28 DIAGNOSIS — Z87891 Personal history of nicotine dependence: Secondary | ICD-10-CM | POA: Diagnosis not present

## 2017-07-29 ENCOUNTER — Telehealth: Payer: Self-pay | Admitting: Pulmonary Disease

## 2017-07-29 DIAGNOSIS — D899 Disorder involving the immune mechanism, unspecified: Secondary | ICD-10-CM | POA: Diagnosis not present

## 2017-07-29 DIAGNOSIS — J449 Chronic obstructive pulmonary disease, unspecified: Secondary | ICD-10-CM | POA: Diagnosis not present

## 2017-07-29 DIAGNOSIS — J9811 Atelectasis: Secondary | ICD-10-CM | POA: Diagnosis not present

## 2017-07-29 DIAGNOSIS — F3181 Bipolar II disorder: Secondary | ICD-10-CM | POA: Diagnosis not present

## 2017-07-29 DIAGNOSIS — Z4659 Encounter for fitting and adjustment of other gastrointestinal appliance and device: Secondary | ICD-10-CM | POA: Diagnosis not present

## 2017-07-29 DIAGNOSIS — Z942 Lung transplant status: Secondary | ICD-10-CM | POA: Diagnosis not present

## 2017-07-29 DIAGNOSIS — Z9189 Other specified personal risk factors, not elsewhere classified: Secondary | ICD-10-CM | POA: Diagnosis not present

## 2017-07-29 DIAGNOSIS — G8912 Acute post-thoracotomy pain: Secondary | ICD-10-CM | POA: Diagnosis not present

## 2017-07-29 DIAGNOSIS — J84112 Idiopathic pulmonary fibrosis: Secondary | ICD-10-CM | POA: Diagnosis not present

## 2017-07-29 DIAGNOSIS — K219 Gastro-esophageal reflux disease without esophagitis: Secondary | ICD-10-CM | POA: Diagnosis not present

## 2017-07-29 DIAGNOSIS — F319 Bipolar disorder, unspecified: Secondary | ICD-10-CM | POA: Diagnosis not present

## 2017-07-29 NOTE — Telephone Encounter (Signed)
Called and spoke with Leotis Shames from [place]. Patient had double lung transplant done. Patient is currently in ICU and should be extubated soon. She wanted [place] to send this to [facility] as an [place].

## 2017-07-30 DIAGNOSIS — Z9189 Other specified personal risk factors, not elsewhere classified: Secondary | ICD-10-CM | POA: Diagnosis not present

## 2017-07-30 DIAGNOSIS — J449 Chronic obstructive pulmonary disease, unspecified: Secondary | ICD-10-CM | POA: Diagnosis not present

## 2017-07-30 DIAGNOSIS — J84112 Idiopathic pulmonary fibrosis: Secondary | ICD-10-CM | POA: Diagnosis not present

## 2017-07-30 DIAGNOSIS — T380X5A Adverse effect of glucocorticoids and synthetic analogues, initial encounter: Secondary | ICD-10-CM | POA: Diagnosis not present

## 2017-07-30 DIAGNOSIS — R739 Hyperglycemia, unspecified: Secondary | ICD-10-CM | POA: Diagnosis not present

## 2017-07-30 DIAGNOSIS — F3181 Bipolar II disorder: Secondary | ICD-10-CM | POA: Diagnosis not present

## 2017-07-30 DIAGNOSIS — D899 Disorder involving the immune mechanism, unspecified: Secondary | ICD-10-CM | POA: Diagnosis not present

## 2017-07-30 DIAGNOSIS — Z942 Lung transplant status: Secondary | ICD-10-CM | POA: Diagnosis not present

## 2017-07-30 DIAGNOSIS — Z4682 Encounter for fitting and adjustment of non-vascular catheter: Secondary | ICD-10-CM | POA: Diagnosis not present

## 2017-07-30 DIAGNOSIS — J90 Pleural effusion, not elsewhere classified: Secondary | ICD-10-CM | POA: Diagnosis not present

## 2017-07-30 DIAGNOSIS — K219 Gastro-esophageal reflux disease without esophagitis: Secondary | ICD-10-CM | POA: Diagnosis not present

## 2017-07-30 DIAGNOSIS — G8918 Other acute postprocedural pain: Secondary | ICD-10-CM | POA: Diagnosis not present

## 2017-07-30 DIAGNOSIS — E118 Type 2 diabetes mellitus with unspecified complications: Secondary | ICD-10-CM | POA: Diagnosis not present

## 2017-07-30 DIAGNOSIS — J9811 Atelectasis: Secondary | ICD-10-CM | POA: Diagnosis not present

## 2017-07-30 DIAGNOSIS — G4700 Insomnia, unspecified: Secondary | ICD-10-CM | POA: Diagnosis not present

## 2017-07-30 DIAGNOSIS — R918 Other nonspecific abnormal finding of lung field: Secondary | ICD-10-CM | POA: Diagnosis not present

## 2017-07-30 DIAGNOSIS — F319 Bipolar disorder, unspecified: Secondary | ICD-10-CM | POA: Diagnosis not present

## 2017-07-30 NOTE — Telephone Encounter (Signed)
OK, thanks for update

## 2017-07-31 DIAGNOSIS — T380X5A Adverse effect of glucocorticoids and synthetic analogues, initial encounter: Secondary | ICD-10-CM | POA: Diagnosis not present

## 2017-07-31 DIAGNOSIS — Z4682 Encounter for fitting and adjustment of non-vascular catheter: Secondary | ICD-10-CM | POA: Diagnosis not present

## 2017-07-31 DIAGNOSIS — J948 Other specified pleural conditions: Secondary | ICD-10-CM | POA: Diagnosis not present

## 2017-07-31 DIAGNOSIS — R739 Hyperglycemia, unspecified: Secondary | ICD-10-CM | POA: Diagnosis not present

## 2017-07-31 DIAGNOSIS — F3181 Bipolar II disorder: Secondary | ICD-10-CM | POA: Diagnosis not present

## 2017-07-31 DIAGNOSIS — J84112 Idiopathic pulmonary fibrosis: Secondary | ICD-10-CM | POA: Diagnosis not present

## 2017-07-31 DIAGNOSIS — R918 Other nonspecific abnormal finding of lung field: Secondary | ICD-10-CM | POA: Diagnosis not present

## 2017-07-31 DIAGNOSIS — J9811 Atelectasis: Secondary | ICD-10-CM | POA: Diagnosis not present

## 2017-07-31 DIAGNOSIS — F319 Bipolar disorder, unspecified: Secondary | ICD-10-CM | POA: Diagnosis not present

## 2017-07-31 DIAGNOSIS — D899 Disorder involving the immune mechanism, unspecified: Secondary | ICD-10-CM | POA: Diagnosis not present

## 2017-07-31 DIAGNOSIS — E118 Type 2 diabetes mellitus with unspecified complications: Secondary | ICD-10-CM | POA: Diagnosis not present

## 2017-07-31 DIAGNOSIS — G8918 Other acute postprocedural pain: Secondary | ICD-10-CM | POA: Diagnosis not present

## 2017-07-31 DIAGNOSIS — Z942 Lung transplant status: Secondary | ICD-10-CM | POA: Diagnosis not present

## 2017-07-31 DIAGNOSIS — J449 Chronic obstructive pulmonary disease, unspecified: Secondary | ICD-10-CM | POA: Diagnosis not present

## 2017-08-01 DIAGNOSIS — E118 Type 2 diabetes mellitus with unspecified complications: Secondary | ICD-10-CM | POA: Diagnosis not present

## 2017-08-01 DIAGNOSIS — J939 Pneumothorax, unspecified: Secondary | ICD-10-CM | POA: Diagnosis not present

## 2017-08-01 DIAGNOSIS — R0602 Shortness of breath: Secondary | ICD-10-CM | POA: Diagnosis not present

## 2017-08-01 DIAGNOSIS — J449 Chronic obstructive pulmonary disease, unspecified: Secondary | ICD-10-CM | POA: Diagnosis not present

## 2017-08-01 DIAGNOSIS — J9811 Atelectasis: Secondary | ICD-10-CM | POA: Diagnosis not present

## 2017-08-01 DIAGNOSIS — E668 Other obesity: Secondary | ICD-10-CM | POA: Diagnosis not present

## 2017-08-01 DIAGNOSIS — T380X5A Adverse effect of glucocorticoids and synthetic analogues, initial encounter: Secondary | ICD-10-CM | POA: Diagnosis not present

## 2017-08-01 DIAGNOSIS — Z942 Lung transplant status: Secondary | ICD-10-CM | POA: Diagnosis not present

## 2017-08-01 DIAGNOSIS — R739 Hyperglycemia, unspecified: Secondary | ICD-10-CM | POA: Diagnosis not present

## 2017-08-01 DIAGNOSIS — J84112 Idiopathic pulmonary fibrosis: Secondary | ICD-10-CM | POA: Diagnosis not present

## 2017-08-01 DIAGNOSIS — F3181 Bipolar II disorder: Secondary | ICD-10-CM | POA: Diagnosis not present

## 2017-08-01 DIAGNOSIS — R0989 Other specified symptoms and signs involving the circulatory and respiratory systems: Secondary | ICD-10-CM | POA: Diagnosis not present

## 2017-08-01 DIAGNOSIS — D899 Disorder involving the immune mechanism, unspecified: Secondary | ICD-10-CM | POA: Diagnosis not present

## 2017-08-02 DIAGNOSIS — J84112 Idiopathic pulmonary fibrosis: Secondary | ICD-10-CM | POA: Diagnosis not present

## 2017-08-02 DIAGNOSIS — E118 Type 2 diabetes mellitus with unspecified complications: Secondary | ICD-10-CM | POA: Diagnosis not present

## 2017-08-02 DIAGNOSIS — T380X5A Adverse effect of glucocorticoids and synthetic analogues, initial encounter: Secondary | ICD-10-CM | POA: Diagnosis not present

## 2017-08-02 DIAGNOSIS — D899 Disorder involving the immune mechanism, unspecified: Secondary | ICD-10-CM | POA: Diagnosis not present

## 2017-08-02 DIAGNOSIS — R1312 Dysphagia, oropharyngeal phase: Secondary | ICD-10-CM | POA: Diagnosis not present

## 2017-08-02 DIAGNOSIS — J449 Chronic obstructive pulmonary disease, unspecified: Secondary | ICD-10-CM | POA: Diagnosis not present

## 2017-08-02 DIAGNOSIS — J939 Pneumothorax, unspecified: Secondary | ICD-10-CM | POA: Diagnosis not present

## 2017-08-02 DIAGNOSIS — J9811 Atelectasis: Secondary | ICD-10-CM | POA: Diagnosis not present

## 2017-08-02 DIAGNOSIS — F3181 Bipolar II disorder: Secondary | ICD-10-CM | POA: Diagnosis not present

## 2017-08-02 DIAGNOSIS — R739 Hyperglycemia, unspecified: Secondary | ICD-10-CM | POA: Diagnosis not present

## 2017-08-02 DIAGNOSIS — Z7682 Awaiting organ transplant status: Secondary | ICD-10-CM | POA: Diagnosis not present

## 2017-08-02 DIAGNOSIS — T86818 Other complications of lung transplant: Secondary | ICD-10-CM | POA: Diagnosis not present

## 2017-08-02 DIAGNOSIS — G8912 Acute post-thoracotomy pain: Secondary | ICD-10-CM | POA: Diagnosis not present

## 2017-08-02 DIAGNOSIS — Z942 Lung transplant status: Secondary | ICD-10-CM | POA: Diagnosis not present

## 2017-08-02 DIAGNOSIS — K219 Gastro-esophageal reflux disease without esophagitis: Secondary | ICD-10-CM | POA: Diagnosis not present

## 2017-08-02 DIAGNOSIS — F319 Bipolar disorder, unspecified: Secondary | ICD-10-CM | POA: Diagnosis not present

## 2017-08-02 DIAGNOSIS — R05 Cough: Secondary | ICD-10-CM | POA: Diagnosis not present

## 2017-08-03 DIAGNOSIS — R918 Other nonspecific abnormal finding of lung field: Secondary | ICD-10-CM | POA: Diagnosis not present

## 2017-08-03 DIAGNOSIS — J811 Chronic pulmonary edema: Secondary | ICD-10-CM | POA: Diagnosis not present

## 2017-08-03 DIAGNOSIS — Z942 Lung transplant status: Secondary | ICD-10-CM | POA: Diagnosis not present

## 2017-08-03 DIAGNOSIS — T380X5A Adverse effect of glucocorticoids and synthetic analogues, initial encounter: Secondary | ICD-10-CM | POA: Diagnosis not present

## 2017-08-03 DIAGNOSIS — J9811 Atelectasis: Secondary | ICD-10-CM | POA: Diagnosis not present

## 2017-08-03 DIAGNOSIS — G8912 Acute post-thoracotomy pain: Secondary | ICD-10-CM | POA: Diagnosis not present

## 2017-08-03 DIAGNOSIS — R739 Hyperglycemia, unspecified: Secondary | ICD-10-CM | POA: Diagnosis not present

## 2017-08-03 DIAGNOSIS — E118 Type 2 diabetes mellitus with unspecified complications: Secondary | ICD-10-CM | POA: Diagnosis not present

## 2017-08-04 DIAGNOSIS — R739 Hyperglycemia, unspecified: Secondary | ICD-10-CM | POA: Diagnosis not present

## 2017-08-04 DIAGNOSIS — F3181 Bipolar II disorder: Secondary | ICD-10-CM | POA: Diagnosis not present

## 2017-08-04 DIAGNOSIS — T380X5A Adverse effect of glucocorticoids and synthetic analogues, initial encounter: Secondary | ICD-10-CM | POA: Diagnosis not present

## 2017-08-04 DIAGNOSIS — D899 Disorder involving the immune mechanism, unspecified: Secondary | ICD-10-CM | POA: Diagnosis not present

## 2017-08-04 DIAGNOSIS — Z7682 Awaiting organ transplant status: Secondary | ICD-10-CM | POA: Diagnosis not present

## 2017-08-04 DIAGNOSIS — E118 Type 2 diabetes mellitus with unspecified complications: Secondary | ICD-10-CM | POA: Diagnosis not present

## 2017-08-04 DIAGNOSIS — G8912 Acute post-thoracotomy pain: Secondary | ICD-10-CM | POA: Diagnosis not present

## 2017-08-04 DIAGNOSIS — Z942 Lung transplant status: Secondary | ICD-10-CM | POA: Diagnosis not present

## 2017-08-04 DIAGNOSIS — J84112 Idiopathic pulmonary fibrosis: Secondary | ICD-10-CM | POA: Diagnosis not present

## 2017-08-05 DIAGNOSIS — G8912 Acute post-thoracotomy pain: Secondary | ICD-10-CM | POA: Diagnosis not present

## 2017-08-05 DIAGNOSIS — F319 Bipolar disorder, unspecified: Secondary | ICD-10-CM | POA: Diagnosis not present

## 2017-08-05 DIAGNOSIS — J84112 Idiopathic pulmonary fibrosis: Secondary | ICD-10-CM | POA: Diagnosis not present

## 2017-08-05 DIAGNOSIS — Z4682 Encounter for fitting and adjustment of non-vascular catheter: Secondary | ICD-10-CM | POA: Diagnosis not present

## 2017-08-05 DIAGNOSIS — Z942 Lung transplant status: Secondary | ICD-10-CM | POA: Diagnosis not present

## 2017-08-05 DIAGNOSIS — E118 Type 2 diabetes mellitus with unspecified complications: Secondary | ICD-10-CM | POA: Diagnosis not present

## 2017-08-05 DIAGNOSIS — J449 Chronic obstructive pulmonary disease, unspecified: Secondary | ICD-10-CM | POA: Diagnosis not present

## 2017-08-05 DIAGNOSIS — R1312 Dysphagia, oropharyngeal phase: Secondary | ICD-10-CM | POA: Diagnosis not present

## 2017-08-05 DIAGNOSIS — T380X5A Adverse effect of glucocorticoids and synthetic analogues, initial encounter: Secondary | ICD-10-CM | POA: Diagnosis not present

## 2017-08-05 DIAGNOSIS — Z7682 Awaiting organ transplant status: Secondary | ICD-10-CM | POA: Diagnosis not present

## 2017-08-05 DIAGNOSIS — K219 Gastro-esophageal reflux disease without esophagitis: Secondary | ICD-10-CM | POA: Diagnosis not present

## 2017-08-05 DIAGNOSIS — R739 Hyperglycemia, unspecified: Secondary | ICD-10-CM | POA: Diagnosis not present

## 2017-08-06 DIAGNOSIS — Z942 Lung transplant status: Secondary | ICD-10-CM | POA: Diagnosis not present

## 2017-08-06 DIAGNOSIS — R739 Hyperglycemia, unspecified: Secondary | ICD-10-CM | POA: Diagnosis not present

## 2017-08-06 DIAGNOSIS — T380X5A Adverse effect of glucocorticoids and synthetic analogues, initial encounter: Secondary | ICD-10-CM | POA: Diagnosis not present

## 2017-08-06 DIAGNOSIS — E118 Type 2 diabetes mellitus with unspecified complications: Secondary | ICD-10-CM | POA: Diagnosis not present

## 2017-08-06 DIAGNOSIS — J84112 Idiopathic pulmonary fibrosis: Secondary | ICD-10-CM | POA: Diagnosis not present

## 2017-08-06 DIAGNOSIS — R1312 Dysphagia, oropharyngeal phase: Secondary | ICD-10-CM | POA: Diagnosis not present

## 2017-08-06 DIAGNOSIS — J9811 Atelectasis: Secondary | ICD-10-CM | POA: Diagnosis not present

## 2017-08-06 DIAGNOSIS — G8912 Acute post-thoracotomy pain: Secondary | ICD-10-CM | POA: Diagnosis not present

## 2017-08-06 DIAGNOSIS — F5101 Primary insomnia: Secondary | ICD-10-CM | POA: Diagnosis not present

## 2017-08-06 DIAGNOSIS — T86818 Other complications of lung transplant: Secondary | ICD-10-CM | POA: Diagnosis not present

## 2017-08-06 DIAGNOSIS — F319 Bipolar disorder, unspecified: Secondary | ICD-10-CM | POA: Diagnosis not present

## 2017-08-06 DIAGNOSIS — J948 Other specified pleural conditions: Secondary | ICD-10-CM | POA: Diagnosis not present

## 2017-08-07 DIAGNOSIS — R1312 Dysphagia, oropharyngeal phase: Secondary | ICD-10-CM | POA: Diagnosis not present

## 2017-08-07 DIAGNOSIS — K219 Gastro-esophageal reflux disease without esophagitis: Secondary | ICD-10-CM | POA: Diagnosis not present

## 2017-08-07 DIAGNOSIS — J84112 Idiopathic pulmonary fibrosis: Secondary | ICD-10-CM | POA: Diagnosis not present

## 2017-08-07 DIAGNOSIS — G8912 Acute post-thoracotomy pain: Secondary | ICD-10-CM | POA: Diagnosis not present

## 2017-08-07 DIAGNOSIS — J948 Other specified pleural conditions: Secondary | ICD-10-CM | POA: Diagnosis not present

## 2017-08-07 DIAGNOSIS — J449 Chronic obstructive pulmonary disease, unspecified: Secondary | ICD-10-CM | POA: Diagnosis not present

## 2017-08-07 DIAGNOSIS — F319 Bipolar disorder, unspecified: Secondary | ICD-10-CM | POA: Diagnosis not present

## 2017-08-07 DIAGNOSIS — R739 Hyperglycemia, unspecified: Secondary | ICD-10-CM | POA: Diagnosis not present

## 2017-08-07 DIAGNOSIS — J9811 Atelectasis: Secondary | ICD-10-CM | POA: Diagnosis not present

## 2017-08-07 DIAGNOSIS — T380X5A Adverse effect of glucocorticoids and synthetic analogues, initial encounter: Secondary | ICD-10-CM | POA: Diagnosis not present

## 2017-08-07 DIAGNOSIS — E118 Type 2 diabetes mellitus with unspecified complications: Secondary | ICD-10-CM | POA: Diagnosis not present

## 2017-08-07 DIAGNOSIS — Z942 Lung transplant status: Secondary | ICD-10-CM | POA: Diagnosis not present

## 2017-08-08 DIAGNOSIS — G8912 Acute post-thoracotomy pain: Secondary | ICD-10-CM | POA: Diagnosis not present

## 2017-08-08 DIAGNOSIS — R1312 Dysphagia, oropharyngeal phase: Secondary | ICD-10-CM | POA: Diagnosis not present

## 2017-08-08 DIAGNOSIS — J449 Chronic obstructive pulmonary disease, unspecified: Secondary | ICD-10-CM | POA: Diagnosis not present

## 2017-08-08 DIAGNOSIS — E118 Type 2 diabetes mellitus with unspecified complications: Secondary | ICD-10-CM | POA: Diagnosis not present

## 2017-08-08 DIAGNOSIS — T380X5A Adverse effect of glucocorticoids and synthetic analogues, initial encounter: Secondary | ICD-10-CM | POA: Diagnosis not present

## 2017-08-08 DIAGNOSIS — J84112 Idiopathic pulmonary fibrosis: Secondary | ICD-10-CM | POA: Diagnosis not present

## 2017-08-08 DIAGNOSIS — Z942 Lung transplant status: Secondary | ICD-10-CM | POA: Diagnosis not present

## 2017-08-08 DIAGNOSIS — K219 Gastro-esophageal reflux disease without esophagitis: Secondary | ICD-10-CM | POA: Diagnosis not present

## 2017-08-08 DIAGNOSIS — F5101 Primary insomnia: Secondary | ICD-10-CM | POA: Diagnosis not present

## 2017-08-08 DIAGNOSIS — F319 Bipolar disorder, unspecified: Secondary | ICD-10-CM | POA: Diagnosis not present

## 2017-08-08 DIAGNOSIS — R739 Hyperglycemia, unspecified: Secondary | ICD-10-CM | POA: Diagnosis not present

## 2017-08-08 DIAGNOSIS — J948 Other specified pleural conditions: Secondary | ICD-10-CM | POA: Diagnosis not present

## 2017-08-09 DIAGNOSIS — K219 Gastro-esophageal reflux disease without esophagitis: Secondary | ICD-10-CM | POA: Diagnosis not present

## 2017-08-09 DIAGNOSIS — R1312 Dysphagia, oropharyngeal phase: Secondary | ICD-10-CM | POA: Diagnosis not present

## 2017-08-09 DIAGNOSIS — T380X5A Adverse effect of glucocorticoids and synthetic analogues, initial encounter: Secondary | ICD-10-CM | POA: Diagnosis not present

## 2017-08-09 DIAGNOSIS — F319 Bipolar disorder, unspecified: Secondary | ICD-10-CM | POA: Diagnosis not present

## 2017-08-09 DIAGNOSIS — G8918 Other acute postprocedural pain: Secondary | ICD-10-CM | POA: Diagnosis not present

## 2017-08-09 DIAGNOSIS — T86818 Other complications of lung transplant: Secondary | ICD-10-CM | POA: Diagnosis not present

## 2017-08-09 DIAGNOSIS — Z942 Lung transplant status: Secondary | ICD-10-CM | POA: Diagnosis not present

## 2017-08-09 DIAGNOSIS — R739 Hyperglycemia, unspecified: Secondary | ICD-10-CM | POA: Diagnosis not present

## 2017-08-09 DIAGNOSIS — J449 Chronic obstructive pulmonary disease, unspecified: Secondary | ICD-10-CM | POA: Diagnosis not present

## 2017-08-09 DIAGNOSIS — J84112 Idiopathic pulmonary fibrosis: Secondary | ICD-10-CM | POA: Diagnosis not present

## 2017-08-09 DIAGNOSIS — E118 Type 2 diabetes mellitus with unspecified complications: Secondary | ICD-10-CM | POA: Diagnosis not present

## 2017-08-10 DIAGNOSIS — G8918 Other acute postprocedural pain: Secondary | ICD-10-CM | POA: Diagnosis not present

## 2017-08-10 DIAGNOSIS — R1312 Dysphagia, oropharyngeal phase: Secondary | ICD-10-CM | POA: Diagnosis not present

## 2017-08-10 DIAGNOSIS — J9811 Atelectasis: Secondary | ICD-10-CM | POA: Diagnosis not present

## 2017-08-10 DIAGNOSIS — J84112 Idiopathic pulmonary fibrosis: Secondary | ICD-10-CM | POA: Diagnosis not present

## 2017-08-10 DIAGNOSIS — G8912 Acute post-thoracotomy pain: Secondary | ICD-10-CM | POA: Diagnosis not present

## 2017-08-10 DIAGNOSIS — Z942 Lung transplant status: Secondary | ICD-10-CM | POA: Diagnosis not present

## 2017-08-10 DIAGNOSIS — F319 Bipolar disorder, unspecified: Secondary | ICD-10-CM | POA: Diagnosis not present

## 2017-08-10 DIAGNOSIS — J939 Pneumothorax, unspecified: Secondary | ICD-10-CM | POA: Diagnosis not present

## 2017-08-11 DIAGNOSIS — R1312 Dysphagia, oropharyngeal phase: Secondary | ICD-10-CM | POA: Diagnosis not present

## 2017-08-11 DIAGNOSIS — Z942 Lung transplant status: Secondary | ICD-10-CM | POA: Diagnosis not present

## 2017-08-12 DIAGNOSIS — J9811 Atelectasis: Secondary | ICD-10-CM | POA: Diagnosis not present

## 2017-08-12 DIAGNOSIS — G8918 Other acute postprocedural pain: Secondary | ICD-10-CM | POA: Diagnosis not present

## 2017-08-12 DIAGNOSIS — J84112 Idiopathic pulmonary fibrosis: Secondary | ICD-10-CM | POA: Diagnosis not present

## 2017-08-12 DIAGNOSIS — E118 Type 2 diabetes mellitus with unspecified complications: Secondary | ICD-10-CM | POA: Diagnosis not present

## 2017-08-12 DIAGNOSIS — Z942 Lung transplant status: Secondary | ICD-10-CM | POA: Diagnosis not present

## 2017-08-12 DIAGNOSIS — R739 Hyperglycemia, unspecified: Secondary | ICD-10-CM | POA: Diagnosis not present

## 2017-08-12 DIAGNOSIS — T380X5A Adverse effect of glucocorticoids and synthetic analogues, initial encounter: Secondary | ICD-10-CM | POA: Diagnosis not present

## 2017-08-12 DIAGNOSIS — R1312 Dysphagia, oropharyngeal phase: Secondary | ICD-10-CM | POA: Diagnosis not present

## 2017-08-12 DIAGNOSIS — F319 Bipolar disorder, unspecified: Secondary | ICD-10-CM | POA: Diagnosis not present

## 2017-08-13 DIAGNOSIS — J84112 Idiopathic pulmonary fibrosis: Secondary | ICD-10-CM | POA: Diagnosis not present

## 2017-08-13 DIAGNOSIS — F319 Bipolar disorder, unspecified: Secondary | ICD-10-CM | POA: Diagnosis not present

## 2017-08-13 DIAGNOSIS — G8918 Other acute postprocedural pain: Secondary | ICD-10-CM | POA: Diagnosis not present

## 2017-08-13 DIAGNOSIS — Z942 Lung transplant status: Secondary | ICD-10-CM | POA: Diagnosis not present

## 2017-08-13 DIAGNOSIS — R1312 Dysphagia, oropharyngeal phase: Secondary | ICD-10-CM | POA: Diagnosis not present

## 2017-08-13 DIAGNOSIS — J811 Chronic pulmonary edema: Secondary | ICD-10-CM | POA: Diagnosis not present

## 2017-08-13 DIAGNOSIS — R739 Hyperglycemia, unspecified: Secondary | ICD-10-CM | POA: Diagnosis not present

## 2017-08-13 DIAGNOSIS — E118 Type 2 diabetes mellitus with unspecified complications: Secondary | ICD-10-CM | POA: Diagnosis not present

## 2017-08-13 DIAGNOSIS — R918 Other nonspecific abnormal finding of lung field: Secondary | ICD-10-CM | POA: Diagnosis not present

## 2017-08-13 DIAGNOSIS — T380X5A Adverse effect of glucocorticoids and synthetic analogues, initial encounter: Secondary | ICD-10-CM | POA: Diagnosis not present

## 2017-08-14 DIAGNOSIS — Z942 Lung transplant status: Secondary | ICD-10-CM | POA: Diagnosis not present

## 2017-08-14 DIAGNOSIS — F319 Bipolar disorder, unspecified: Secondary | ICD-10-CM | POA: Diagnosis not present

## 2017-08-14 DIAGNOSIS — J84112 Idiopathic pulmonary fibrosis: Secondary | ICD-10-CM | POA: Diagnosis not present

## 2017-08-14 DIAGNOSIS — R1312 Dysphagia, oropharyngeal phase: Secondary | ICD-10-CM | POA: Diagnosis not present

## 2017-08-15 DIAGNOSIS — J84112 Idiopathic pulmonary fibrosis: Secondary | ICD-10-CM | POA: Diagnosis not present

## 2017-08-15 DIAGNOSIS — F319 Bipolar disorder, unspecified: Secondary | ICD-10-CM | POA: Diagnosis not present

## 2017-08-15 DIAGNOSIS — R1312 Dysphagia, oropharyngeal phase: Secondary | ICD-10-CM | POA: Diagnosis not present

## 2017-08-15 DIAGNOSIS — Z942 Lung transplant status: Secondary | ICD-10-CM | POA: Diagnosis not present

## 2017-08-21 DIAGNOSIS — Z942 Lung transplant status: Secondary | ICD-10-CM | POA: Diagnosis not present

## 2017-08-21 DIAGNOSIS — F319 Bipolar disorder, unspecified: Secondary | ICD-10-CM | POA: Diagnosis not present

## 2017-08-21 DIAGNOSIS — T86819 Unspecified complication of lung transplant: Secondary | ICD-10-CM | POA: Diagnosis not present

## 2017-08-21 DIAGNOSIS — D899 Disorder involving the immune mechanism, unspecified: Secondary | ICD-10-CM | POA: Diagnosis not present

## 2017-08-22 DIAGNOSIS — Z942 Lung transplant status: Secondary | ICD-10-CM | POA: Diagnosis not present

## 2017-08-22 DIAGNOSIS — T86818 Other complications of lung transplant: Secondary | ICD-10-CM | POA: Diagnosis not present

## 2017-08-22 DIAGNOSIS — Z4824 Encounter for aftercare following lung transplant: Secondary | ICD-10-CM | POA: Diagnosis not present

## 2017-08-24 DIAGNOSIS — J8410 Pulmonary fibrosis, unspecified: Secondary | ICD-10-CM | POA: Diagnosis not present

## 2017-08-24 DIAGNOSIS — R0602 Shortness of breath: Secondary | ICD-10-CM | POA: Diagnosis not present

## 2017-08-24 DIAGNOSIS — Z942 Lung transplant status: Secondary | ICD-10-CM | POA: Diagnosis not present

## 2017-08-24 DIAGNOSIS — R05 Cough: Secondary | ICD-10-CM | POA: Diagnosis not present

## 2017-08-24 DIAGNOSIS — Z79899 Other long term (current) drug therapy: Secondary | ICD-10-CM | POA: Diagnosis not present

## 2017-08-24 DIAGNOSIS — R0789 Other chest pain: Secondary | ICD-10-CM | POA: Diagnosis not present

## 2017-08-24 DIAGNOSIS — Z87891 Personal history of nicotine dependence: Secondary | ICD-10-CM | POA: Diagnosis not present

## 2017-08-24 DIAGNOSIS — Z7952 Long term (current) use of systemic steroids: Secondary | ICD-10-CM | POA: Diagnosis not present

## 2017-08-24 DIAGNOSIS — Z9889 Other specified postprocedural states: Secondary | ICD-10-CM | POA: Diagnosis not present

## 2017-08-24 DIAGNOSIS — Z5181 Encounter for therapeutic drug level monitoring: Secondary | ICD-10-CM | POA: Diagnosis not present

## 2017-08-24 DIAGNOSIS — R0601 Orthopnea: Secondary | ICD-10-CM | POA: Diagnosis not present

## 2017-08-24 DIAGNOSIS — Z7982 Long term (current) use of aspirin: Secondary | ICD-10-CM | POA: Diagnosis not present

## 2017-08-24 DIAGNOSIS — R000 Tachycardia, unspecified: Secondary | ICD-10-CM | POA: Diagnosis not present

## 2017-08-24 DIAGNOSIS — J449 Chronic obstructive pulmonary disease, unspecified: Secondary | ICD-10-CM | POA: Diagnosis not present

## 2017-08-27 DIAGNOSIS — F319 Bipolar disorder, unspecified: Secondary | ICD-10-CM | POA: Diagnosis not present

## 2017-08-27 DIAGNOSIS — Z942 Lung transplant status: Secondary | ICD-10-CM | POA: Diagnosis not present

## 2017-08-30 DIAGNOSIS — T86819 Unspecified complication of lung transplant: Secondary | ICD-10-CM | POA: Diagnosis not present

## 2017-08-30 DIAGNOSIS — T380X5A Adverse effect of glucocorticoids and synthetic analogues, initial encounter: Secondary | ICD-10-CM | POA: Diagnosis not present

## 2017-08-30 DIAGNOSIS — R7303 Prediabetes: Secondary | ICD-10-CM | POA: Diagnosis not present

## 2017-08-30 DIAGNOSIS — Z942 Lung transplant status: Secondary | ICD-10-CM | POA: Diagnosis not present

## 2017-08-30 DIAGNOSIS — Z4824 Encounter for aftercare following lung transplant: Secondary | ICD-10-CM | POA: Diagnosis not present

## 2017-08-30 DIAGNOSIS — R0601 Orthopnea: Secondary | ICD-10-CM | POA: Diagnosis not present

## 2017-08-30 DIAGNOSIS — F319 Bipolar disorder, unspecified: Secondary | ICD-10-CM | POA: Diagnosis not present

## 2017-08-30 DIAGNOSIS — Z79899 Other long term (current) drug therapy: Secondary | ICD-10-CM | POA: Diagnosis not present

## 2017-08-30 DIAGNOSIS — E7849 Other hyperlipidemia: Secondary | ICD-10-CM | POA: Diagnosis not present

## 2017-09-02 DIAGNOSIS — Z4889 Encounter for other specified surgical aftercare: Secondary | ICD-10-CM | POA: Diagnosis not present

## 2017-09-02 DIAGNOSIS — R262 Difficulty in walking, not elsewhere classified: Secondary | ICD-10-CM | POA: Diagnosis not present

## 2017-09-03 DIAGNOSIS — R262 Difficulty in walking, not elsewhere classified: Secondary | ICD-10-CM | POA: Diagnosis not present

## 2017-09-03 DIAGNOSIS — Z4889 Encounter for other specified surgical aftercare: Secondary | ICD-10-CM | POA: Diagnosis not present

## 2017-09-04 DIAGNOSIS — R0601 Orthopnea: Secondary | ICD-10-CM | POA: Diagnosis not present

## 2017-09-04 DIAGNOSIS — Z942 Lung transplant status: Secondary | ICD-10-CM | POA: Diagnosis not present

## 2017-09-04 DIAGNOSIS — R9389 Abnormal findings on diagnostic imaging of other specified body structures: Secondary | ICD-10-CM | POA: Diagnosis not present

## 2017-09-06 DIAGNOSIS — R05 Cough: Secondary | ICD-10-CM | POA: Diagnosis not present

## 2017-09-06 DIAGNOSIS — F319 Bipolar disorder, unspecified: Secondary | ICD-10-CM | POA: Diagnosis not present

## 2017-09-06 DIAGNOSIS — R918 Other nonspecific abnormal finding of lung field: Secondary | ICD-10-CM | POA: Diagnosis not present

## 2017-09-06 DIAGNOSIS — Z4824 Encounter for aftercare following lung transplant: Secondary | ICD-10-CM | POA: Diagnosis not present

## 2017-09-06 DIAGNOSIS — Z4802 Encounter for removal of sutures: Secondary | ICD-10-CM | POA: Diagnosis not present

## 2017-09-06 DIAGNOSIS — R093 Abnormal sputum: Secondary | ICD-10-CM | POA: Diagnosis not present

## 2017-09-06 DIAGNOSIS — Z87891 Personal history of nicotine dependence: Secondary | ICD-10-CM | POA: Diagnosis not present

## 2017-09-06 DIAGNOSIS — Z942 Lung transplant status: Secondary | ICD-10-CM | POA: Diagnosis not present

## 2017-09-10 DIAGNOSIS — Z4889 Encounter for other specified surgical aftercare: Secondary | ICD-10-CM | POA: Diagnosis not present

## 2017-09-10 DIAGNOSIS — R262 Difficulty in walking, not elsewhere classified: Secondary | ICD-10-CM | POA: Diagnosis not present

## 2017-09-11 DIAGNOSIS — Z4889 Encounter for other specified surgical aftercare: Secondary | ICD-10-CM | POA: Diagnosis not present

## 2017-09-11 DIAGNOSIS — R262 Difficulty in walking, not elsewhere classified: Secondary | ICD-10-CM | POA: Diagnosis not present

## 2017-09-12 DIAGNOSIS — Z4824 Encounter for aftercare following lung transplant: Secondary | ICD-10-CM | POA: Diagnosis not present

## 2017-09-12 DIAGNOSIS — Z87891 Personal history of nicotine dependence: Secondary | ICD-10-CM | POA: Diagnosis not present

## 2017-09-12 DIAGNOSIS — M5136 Other intervertebral disc degeneration, lumbar region: Secondary | ICD-10-CM | POA: Diagnosis not present

## 2017-09-12 DIAGNOSIS — R918 Other nonspecific abnormal finding of lung field: Secondary | ICD-10-CM | POA: Diagnosis not present

## 2017-09-12 DIAGNOSIS — Z915 Personal history of self-harm: Secondary | ICD-10-CM | POA: Diagnosis not present

## 2017-09-12 DIAGNOSIS — M545 Low back pain: Secondary | ICD-10-CM | POA: Diagnosis not present

## 2017-09-12 DIAGNOSIS — M2578 Osteophyte, vertebrae: Secondary | ICD-10-CM | POA: Diagnosis not present

## 2017-09-12 DIAGNOSIS — M546 Pain in thoracic spine: Secondary | ICD-10-CM | POA: Diagnosis not present

## 2017-09-12 DIAGNOSIS — Z942 Lung transplant status: Secondary | ICD-10-CM | POA: Diagnosis not present

## 2017-09-12 DIAGNOSIS — N182 Chronic kidney disease, stage 2 (mild): Secondary | ICD-10-CM | POA: Diagnosis not present

## 2017-09-12 DIAGNOSIS — M4316 Spondylolisthesis, lumbar region: Secondary | ICD-10-CM | POA: Diagnosis not present

## 2017-09-13 DIAGNOSIS — Z87891 Personal history of nicotine dependence: Secondary | ICD-10-CM | POA: Diagnosis not present

## 2017-09-13 DIAGNOSIS — M546 Pain in thoracic spine: Secondary | ICD-10-CM | POA: Diagnosis not present

## 2017-09-13 DIAGNOSIS — N182 Chronic kidney disease, stage 2 (mild): Secondary | ICD-10-CM | POA: Diagnosis not present

## 2017-09-13 DIAGNOSIS — T86810 Lung transplant rejection: Secondary | ICD-10-CM | POA: Diagnosis not present

## 2017-09-13 DIAGNOSIS — Z794 Long term (current) use of insulin: Secondary | ICD-10-CM | POA: Diagnosis not present

## 2017-09-13 DIAGNOSIS — R1310 Dysphagia, unspecified: Secondary | ICD-10-CM | POA: Diagnosis not present

## 2017-09-13 DIAGNOSIS — Z942 Lung transplant status: Secondary | ICD-10-CM | POA: Diagnosis not present

## 2017-09-13 DIAGNOSIS — Z79899 Other long term (current) drug therapy: Secondary | ICD-10-CM | POA: Diagnosis not present

## 2017-09-13 DIAGNOSIS — Z8709 Personal history of other diseases of the respiratory system: Secondary | ICD-10-CM | POA: Diagnosis not present

## 2017-09-16 DIAGNOSIS — F319 Bipolar disorder, unspecified: Secondary | ICD-10-CM | POA: Diagnosis not present

## 2017-09-17 DIAGNOSIS — Z942 Lung transplant status: Secondary | ICD-10-CM | POA: Diagnosis not present

## 2017-09-17 DIAGNOSIS — F319 Bipolar disorder, unspecified: Secondary | ICD-10-CM | POA: Diagnosis not present

## 2017-09-17 DIAGNOSIS — G4733 Obstructive sleep apnea (adult) (pediatric): Secondary | ICD-10-CM | POA: Diagnosis not present

## 2017-09-18 DIAGNOSIS — Z942 Lung transplant status: Secondary | ICD-10-CM | POA: Diagnosis not present

## 2017-09-23 DIAGNOSIS — Z942 Lung transplant status: Secondary | ICD-10-CM | POA: Diagnosis not present

## 2017-09-23 DIAGNOSIS — R262 Difficulty in walking, not elsewhere classified: Secondary | ICD-10-CM | POA: Diagnosis not present

## 2017-09-23 DIAGNOSIS — Z79899 Other long term (current) drug therapy: Secondary | ICD-10-CM | POA: Diagnosis not present

## 2017-09-23 DIAGNOSIS — Z4889 Encounter for other specified surgical aftercare: Secondary | ICD-10-CM | POA: Diagnosis not present

## 2017-09-23 DIAGNOSIS — Z7682 Awaiting organ transplant status: Secondary | ICD-10-CM | POA: Diagnosis not present

## 2017-09-24 DIAGNOSIS — Z942 Lung transplant status: Secondary | ICD-10-CM | POA: Diagnosis not present

## 2017-09-24 DIAGNOSIS — R1310 Dysphagia, unspecified: Secondary | ICD-10-CM | POA: Diagnosis not present

## 2017-09-24 DIAGNOSIS — K224 Dyskinesia of esophagus: Secondary | ICD-10-CM | POA: Diagnosis not present

## 2017-09-25 DIAGNOSIS — R262 Difficulty in walking, not elsewhere classified: Secondary | ICD-10-CM | POA: Diagnosis not present

## 2017-09-25 DIAGNOSIS — Z4889 Encounter for other specified surgical aftercare: Secondary | ICD-10-CM | POA: Diagnosis not present

## 2017-09-25 DIAGNOSIS — Z942 Lung transplant status: Secondary | ICD-10-CM | POA: Diagnosis not present

## 2017-09-25 DIAGNOSIS — T86810 Lung transplant rejection: Secondary | ICD-10-CM | POA: Diagnosis not present

## 2017-09-26 DIAGNOSIS — R918 Other nonspecific abnormal finding of lung field: Secondary | ICD-10-CM | POA: Diagnosis not present

## 2017-09-26 DIAGNOSIS — M546 Pain in thoracic spine: Secondary | ICD-10-CM | POA: Diagnosis not present

## 2017-09-26 DIAGNOSIS — R1310 Dysphagia, unspecified: Secondary | ICD-10-CM | POA: Diagnosis not present

## 2017-09-26 DIAGNOSIS — Z942 Lung transplant status: Secondary | ICD-10-CM | POA: Diagnosis not present

## 2017-09-27 DIAGNOSIS — Z4824 Encounter for aftercare following lung transplant: Secondary | ICD-10-CM | POA: Diagnosis not present

## 2017-09-27 DIAGNOSIS — T86819 Unspecified complication of lung transplant: Secondary | ICD-10-CM | POA: Diagnosis not present

## 2017-09-27 DIAGNOSIS — Z79899 Other long term (current) drug therapy: Secondary | ICD-10-CM | POA: Diagnosis not present

## 2017-09-27 DIAGNOSIS — Z942 Lung transplant status: Secondary | ICD-10-CM | POA: Diagnosis not present

## 2017-09-27 DIAGNOSIS — J986 Disorders of diaphragm: Secondary | ICD-10-CM | POA: Diagnosis not present

## 2017-09-27 DIAGNOSIS — N182 Chronic kidney disease, stage 2 (mild): Secondary | ICD-10-CM | POA: Diagnosis not present

## 2017-09-27 DIAGNOSIS — F319 Bipolar disorder, unspecified: Secondary | ICD-10-CM | POA: Diagnosis not present

## 2017-09-27 DIAGNOSIS — T86810 Lung transplant rejection: Secondary | ICD-10-CM | POA: Diagnosis not present

## 2017-09-27 DIAGNOSIS — M549 Dorsalgia, unspecified: Secondary | ICD-10-CM | POA: Diagnosis not present

## 2017-09-27 DIAGNOSIS — Z87891 Personal history of nicotine dependence: Secondary | ICD-10-CM | POA: Diagnosis not present

## 2017-09-27 DIAGNOSIS — R1310 Dysphagia, unspecified: Secondary | ICD-10-CM | POA: Diagnosis not present

## 2017-09-27 DIAGNOSIS — F3189 Other bipolar disorder: Secondary | ICD-10-CM | POA: Diagnosis not present

## 2017-09-27 DIAGNOSIS — Z915 Personal history of self-harm: Secondary | ICD-10-CM | POA: Diagnosis not present

## 2017-10-01 DIAGNOSIS — R262 Difficulty in walking, not elsewhere classified: Secondary | ICD-10-CM | POA: Diagnosis not present

## 2017-10-01 DIAGNOSIS — Z4889 Encounter for other specified surgical aftercare: Secondary | ICD-10-CM | POA: Diagnosis not present

## 2017-10-02 DIAGNOSIS — Z4889 Encounter for other specified surgical aftercare: Secondary | ICD-10-CM | POA: Diagnosis not present

## 2017-10-02 DIAGNOSIS — R262 Difficulty in walking, not elsewhere classified: Secondary | ICD-10-CM | POA: Diagnosis not present

## 2017-10-07 DIAGNOSIS — Z4889 Encounter for other specified surgical aftercare: Secondary | ICD-10-CM | POA: Diagnosis not present

## 2017-10-07 DIAGNOSIS — R262 Difficulty in walking, not elsewhere classified: Secondary | ICD-10-CM | POA: Diagnosis not present

## 2017-10-09 DIAGNOSIS — R42 Dizziness and giddiness: Secondary | ICD-10-CM | POA: Diagnosis not present

## 2017-10-09 DIAGNOSIS — J440 Chronic obstructive pulmonary disease with acute lower respiratory infection: Secondary | ICD-10-CM | POA: Diagnosis not present

## 2017-10-09 DIAGNOSIS — I129 Hypertensive chronic kidney disease with stage 1 through stage 4 chronic kidney disease, or unspecified chronic kidney disease: Secondary | ICD-10-CM | POA: Diagnosis not present

## 2017-10-09 DIAGNOSIS — R7303 Prediabetes: Secondary | ICD-10-CM | POA: Diagnosis not present

## 2017-10-09 DIAGNOSIS — N182 Chronic kidney disease, stage 2 (mild): Secondary | ICD-10-CM | POA: Diagnosis not present

## 2017-10-09 DIAGNOSIS — G4733 Obstructive sleep apnea (adult) (pediatric): Secondary | ICD-10-CM | POA: Diagnosis not present

## 2017-10-09 DIAGNOSIS — H532 Diplopia: Secondary | ICD-10-CM | POA: Diagnosis not present

## 2017-10-09 DIAGNOSIS — R2990 Unspecified symptoms and signs involving the nervous system: Secondary | ICD-10-CM | POA: Diagnosis not present

## 2017-10-09 DIAGNOSIS — R29818 Other symptoms and signs involving the nervous system: Secondary | ICD-10-CM | POA: Diagnosis not present

## 2017-10-09 DIAGNOSIS — R4789 Other speech disturbances: Secondary | ICD-10-CM | POA: Diagnosis not present

## 2017-10-09 DIAGNOSIS — G459 Transient cerebral ischemic attack, unspecified: Secondary | ICD-10-CM | POA: Diagnosis not present

## 2017-10-09 DIAGNOSIS — Z942 Lung transplant status: Secondary | ICD-10-CM | POA: Diagnosis not present

## 2017-10-09 DIAGNOSIS — R251 Tremor, unspecified: Secondary | ICD-10-CM | POA: Diagnosis not present

## 2017-10-09 DIAGNOSIS — Z915 Personal history of self-harm: Secondary | ICD-10-CM | POA: Diagnosis not present

## 2017-10-09 DIAGNOSIS — T451X5A Adverse effect of antineoplastic and immunosuppressive drugs, initial encounter: Secondary | ICD-10-CM | POA: Diagnosis not present

## 2017-10-09 DIAGNOSIS — F319 Bipolar disorder, unspecified: Secondary | ICD-10-CM | POA: Diagnosis not present

## 2017-10-09 DIAGNOSIS — R892 Abnormal level of other drugs, medicaments and biological substances in specimens from other organs, systems and tissues: Secondary | ICD-10-CM | POA: Diagnosis not present

## 2017-10-09 DIAGNOSIS — R270 Ataxia, unspecified: Secondary | ICD-10-CM | POA: Diagnosis not present

## 2017-10-09 DIAGNOSIS — D899 Disorder involving the immune mechanism, unspecified: Secondary | ICD-10-CM | POA: Diagnosis not present

## 2017-10-09 DIAGNOSIS — E782 Mixed hyperlipidemia: Secondary | ICD-10-CM | POA: Diagnosis not present

## 2017-10-09 DIAGNOSIS — R531 Weakness: Secondary | ICD-10-CM | POA: Diagnosis not present

## 2017-10-09 DIAGNOSIS — Z6830 Body mass index (BMI) 30.0-30.9, adult: Secondary | ICD-10-CM | POA: Diagnosis not present

## 2017-10-09 DIAGNOSIS — K219 Gastro-esophageal reflux disease without esophagitis: Secondary | ICD-10-CM | POA: Diagnosis not present

## 2017-10-09 DIAGNOSIS — Z87891 Personal history of nicotine dependence: Secondary | ICD-10-CM | POA: Diagnosis not present

## 2017-10-09 DIAGNOSIS — H5089 Other specified strabismus: Secondary | ICD-10-CM | POA: Diagnosis not present

## 2017-10-09 DIAGNOSIS — E669 Obesity, unspecified: Secondary | ICD-10-CM | POA: Diagnosis not present

## 2017-10-10 DIAGNOSIS — D899 Disorder involving the immune mechanism, unspecified: Secondary | ICD-10-CM | POA: Diagnosis not present

## 2017-10-10 DIAGNOSIS — Z942 Lung transplant status: Secondary | ICD-10-CM | POA: Diagnosis not present

## 2017-10-10 DIAGNOSIS — Z915 Personal history of self-harm: Secondary | ICD-10-CM | POA: Diagnosis not present

## 2017-10-10 DIAGNOSIS — R29818 Other symptoms and signs involving the nervous system: Secondary | ICD-10-CM | POA: Diagnosis not present

## 2017-10-10 DIAGNOSIS — R2990 Unspecified symptoms and signs involving the nervous system: Secondary | ICD-10-CM | POA: Diagnosis not present

## 2017-10-10 DIAGNOSIS — R892 Abnormal level of other drugs, medicaments and biological substances in specimens from other organs, systems and tissues: Secondary | ICD-10-CM | POA: Diagnosis not present

## 2017-10-10 DIAGNOSIS — F319 Bipolar disorder, unspecified: Secondary | ICD-10-CM | POA: Diagnosis not present

## 2017-10-10 DIAGNOSIS — G4733 Obstructive sleep apnea (adult) (pediatric): Secondary | ICD-10-CM | POA: Diagnosis not present

## 2017-10-10 DIAGNOSIS — N182 Chronic kidney disease, stage 2 (mild): Secondary | ICD-10-CM | POA: Diagnosis not present

## 2017-10-11 DIAGNOSIS — F319 Bipolar disorder, unspecified: Secondary | ICD-10-CM | POA: Diagnosis not present

## 2017-10-11 DIAGNOSIS — N182 Chronic kidney disease, stage 2 (mild): Secondary | ICD-10-CM | POA: Diagnosis not present

## 2017-10-11 DIAGNOSIS — D899 Disorder involving the immune mechanism, unspecified: Secondary | ICD-10-CM | POA: Diagnosis not present

## 2017-10-11 DIAGNOSIS — R2990 Unspecified symptoms and signs involving the nervous system: Secondary | ICD-10-CM | POA: Diagnosis not present

## 2017-10-14 DIAGNOSIS — Z942 Lung transplant status: Secondary | ICD-10-CM | POA: Diagnosis not present

## 2017-10-14 DIAGNOSIS — Z4889 Encounter for other specified surgical aftercare: Secondary | ICD-10-CM | POA: Diagnosis not present

## 2017-10-15 DIAGNOSIS — Z942 Lung transplant status: Secondary | ICD-10-CM | POA: Diagnosis not present

## 2017-10-15 DIAGNOSIS — G4733 Obstructive sleep apnea (adult) (pediatric): Secondary | ICD-10-CM | POA: Diagnosis not present

## 2017-10-15 DIAGNOSIS — F319 Bipolar disorder, unspecified: Secondary | ICD-10-CM | POA: Diagnosis not present

## 2017-10-23 DIAGNOSIS — R251 Tremor, unspecified: Secondary | ICD-10-CM | POA: Diagnosis not present

## 2017-10-23 DIAGNOSIS — I10 Essential (primary) hypertension: Secondary | ICD-10-CM | POA: Diagnosis not present

## 2017-10-23 DIAGNOSIS — Z4824 Encounter for aftercare following lung transplant: Secondary | ICD-10-CM | POA: Diagnosis not present

## 2017-10-23 DIAGNOSIS — M549 Dorsalgia, unspecified: Secondary | ICD-10-CM | POA: Diagnosis not present

## 2017-10-23 DIAGNOSIS — T86819 Unspecified complication of lung transplant: Secondary | ICD-10-CM | POA: Diagnosis not present

## 2017-10-23 DIAGNOSIS — F3189 Other bipolar disorder: Secondary | ICD-10-CM | POA: Diagnosis not present

## 2017-10-23 DIAGNOSIS — Z87891 Personal history of nicotine dependence: Secondary | ICD-10-CM | POA: Diagnosis not present

## 2017-10-23 DIAGNOSIS — Z915 Personal history of self-harm: Secondary | ICD-10-CM | POA: Diagnosis not present

## 2017-10-23 DIAGNOSIS — R918 Other nonspecific abnormal finding of lung field: Secondary | ICD-10-CM | POA: Diagnosis not present

## 2017-10-23 DIAGNOSIS — J929 Pleural plaque without asbestos: Secondary | ICD-10-CM | POA: Diagnosis not present

## 2017-10-23 DIAGNOSIS — E782 Mixed hyperlipidemia: Secondary | ICD-10-CM | POA: Diagnosis not present

## 2017-10-23 DIAGNOSIS — M546 Pain in thoracic spine: Secondary | ICD-10-CM | POA: Diagnosis not present

## 2017-10-23 DIAGNOSIS — Z942 Lung transplant status: Secondary | ICD-10-CM | POA: Diagnosis not present

## 2017-10-23 DIAGNOSIS — F319 Bipolar disorder, unspecified: Secondary | ICD-10-CM | POA: Diagnosis not present

## 2017-10-24 DIAGNOSIS — T86810 Lung transplant rejection: Secondary | ICD-10-CM | POA: Diagnosis not present

## 2017-10-24 DIAGNOSIS — Z794 Long term (current) use of insulin: Secondary | ICD-10-CM | POA: Diagnosis not present

## 2017-10-24 DIAGNOSIS — K219 Gastro-esophageal reflux disease without esophagitis: Secondary | ICD-10-CM | POA: Diagnosis not present

## 2017-10-24 DIAGNOSIS — Z8709 Personal history of other diseases of the respiratory system: Secondary | ICD-10-CM | POA: Diagnosis not present

## 2017-10-24 DIAGNOSIS — Z886 Allergy status to analgesic agent status: Secondary | ICD-10-CM | POA: Diagnosis not present

## 2017-10-24 DIAGNOSIS — T86818 Other complications of lung transplant: Secondary | ICD-10-CM | POA: Diagnosis not present

## 2017-10-24 DIAGNOSIS — F319 Bipolar disorder, unspecified: Secondary | ICD-10-CM | POA: Diagnosis not present

## 2017-10-24 DIAGNOSIS — Z79899 Other long term (current) drug therapy: Secondary | ICD-10-CM | POA: Diagnosis not present

## 2017-10-24 DIAGNOSIS — R7303 Prediabetes: Secondary | ICD-10-CM | POA: Diagnosis not present

## 2017-10-24 DIAGNOSIS — Z7982 Long term (current) use of aspirin: Secondary | ICD-10-CM | POA: Diagnosis not present

## 2017-10-24 DIAGNOSIS — G4733 Obstructive sleep apnea (adult) (pediatric): Secondary | ICD-10-CM | POA: Diagnosis not present

## 2017-10-24 DIAGNOSIS — E785 Hyperlipidemia, unspecified: Secondary | ICD-10-CM | POA: Diagnosis not present

## 2017-10-31 DIAGNOSIS — Z942 Lung transplant status: Secondary | ICD-10-CM | POA: Diagnosis not present

## 2017-11-07 DIAGNOSIS — R7303 Prediabetes: Secondary | ICD-10-CM | POA: Diagnosis not present

## 2017-11-07 DIAGNOSIS — Z942 Lung transplant status: Secondary | ICD-10-CM | POA: Diagnosis not present

## 2017-11-07 DIAGNOSIS — R739 Hyperglycemia, unspecified: Secondary | ICD-10-CM | POA: Diagnosis not present

## 2017-11-07 DIAGNOSIS — T380X5A Adverse effect of glucocorticoids and synthetic analogues, initial encounter: Secondary | ICD-10-CM | POA: Diagnosis not present

## 2017-11-08 DIAGNOSIS — L03032 Cellulitis of left toe: Secondary | ICD-10-CM | POA: Diagnosis not present

## 2017-11-08 DIAGNOSIS — L600 Ingrowing nail: Secondary | ICD-10-CM | POA: Diagnosis not present

## 2017-11-12 DIAGNOSIS — T86810 Lung transplant rejection: Secondary | ICD-10-CM | POA: Diagnosis not present

## 2017-11-12 DIAGNOSIS — Z4824 Encounter for aftercare following lung transplant: Secondary | ICD-10-CM | POA: Diagnosis not present

## 2017-11-12 DIAGNOSIS — Z942 Lung transplant status: Secondary | ICD-10-CM | POA: Diagnosis not present

## 2017-11-21 DIAGNOSIS — Z942 Lung transplant status: Secondary | ICD-10-CM | POA: Diagnosis not present

## 2017-12-06 DIAGNOSIS — F319 Bipolar disorder, unspecified: Secondary | ICD-10-CM | POA: Diagnosis not present

## 2017-12-06 DIAGNOSIS — R918 Other nonspecific abnormal finding of lung field: Secondary | ICD-10-CM | POA: Diagnosis not present

## 2017-12-06 DIAGNOSIS — I10 Essential (primary) hypertension: Secondary | ICD-10-CM | POA: Diagnosis not present

## 2017-12-06 DIAGNOSIS — R0602 Shortness of breath: Secondary | ICD-10-CM | POA: Diagnosis not present

## 2017-12-06 DIAGNOSIS — T86819 Unspecified complication of lung transplant: Secondary | ICD-10-CM | POA: Diagnosis not present

## 2017-12-06 DIAGNOSIS — Z942 Lung transplant status: Secondary | ICD-10-CM | POA: Diagnosis not present

## 2017-12-06 DIAGNOSIS — J9811 Atelectasis: Secondary | ICD-10-CM | POA: Diagnosis not present

## 2017-12-13 DIAGNOSIS — J449 Chronic obstructive pulmonary disease, unspecified: Secondary | ICD-10-CM | POA: Diagnosis not present

## 2017-12-13 DIAGNOSIS — T86810 Lung transplant rejection: Secondary | ICD-10-CM | POA: Diagnosis not present

## 2017-12-13 DIAGNOSIS — Z4824 Encounter for aftercare following lung transplant: Secondary | ICD-10-CM | POA: Diagnosis not present

## 2017-12-13 DIAGNOSIS — M1990 Unspecified osteoarthritis, unspecified site: Secondary | ICD-10-CM | POA: Diagnosis not present

## 2017-12-13 DIAGNOSIS — Z794 Long term (current) use of insulin: Secondary | ICD-10-CM | POA: Diagnosis not present

## 2017-12-13 DIAGNOSIS — E785 Hyperlipidemia, unspecified: Secondary | ICD-10-CM | POA: Diagnosis not present

## 2017-12-13 DIAGNOSIS — E119 Type 2 diabetes mellitus without complications: Secondary | ICD-10-CM | POA: Diagnosis not present

## 2017-12-13 DIAGNOSIS — J8410 Pulmonary fibrosis, unspecified: Secondary | ICD-10-CM | POA: Diagnosis not present

## 2017-12-13 DIAGNOSIS — G4733 Obstructive sleep apnea (adult) (pediatric): Secondary | ICD-10-CM | POA: Diagnosis not present

## 2017-12-13 DIAGNOSIS — K219 Gastro-esophageal reflux disease without esophagitis: Secondary | ICD-10-CM | POA: Diagnosis not present

## 2017-12-13 DIAGNOSIS — Z79899 Other long term (current) drug therapy: Secondary | ICD-10-CM | POA: Diagnosis not present

## 2017-12-13 DIAGNOSIS — Z7982 Long term (current) use of aspirin: Secondary | ICD-10-CM | POA: Diagnosis not present

## 2017-12-13 DIAGNOSIS — Z942 Lung transplant status: Secondary | ICD-10-CM | POA: Diagnosis not present

## 2017-12-23 ENCOUNTER — Other Ambulatory Visit: Payer: Self-pay

## 2017-12-23 ENCOUNTER — Emergency Department: Payer: BLUE CROSS/BLUE SHIELD

## 2017-12-23 ENCOUNTER — Encounter: Payer: Self-pay | Admitting: Emergency Medicine

## 2017-12-23 ENCOUNTER — Emergency Department
Admission: EM | Admit: 2017-12-23 | Discharge: 2017-12-23 | Disposition: A | Discharge status: Home / Self Care | Payer: BLUE CROSS/BLUE SHIELD | Attending: Emergency Medicine | Admitting: Emergency Medicine

## 2017-12-23 DIAGNOSIS — Z79899 Other long term (current) drug therapy: Secondary | ICD-10-CM | POA: Insufficient documentation

## 2017-12-23 DIAGNOSIS — R079 Chest pain, unspecified: Secondary | ICD-10-CM

## 2017-12-23 DIAGNOSIS — R1013 Epigastric pain: Secondary | ICD-10-CM | POA: Insufficient documentation

## 2017-12-23 DIAGNOSIS — Z87891 Personal history of nicotine dependence: Secondary | ICD-10-CM | POA: Diagnosis not present

## 2017-12-23 DIAGNOSIS — K219 Gastro-esophageal reflux disease without esophagitis: Secondary | ICD-10-CM

## 2017-12-23 DIAGNOSIS — R0789 Other chest pain: Secondary | ICD-10-CM | POA: Diagnosis not present

## 2017-12-23 DIAGNOSIS — Z7982 Long term (current) use of aspirin: Secondary | ICD-10-CM | POA: Insufficient documentation

## 2017-12-23 DIAGNOSIS — R0602 Shortness of breath: Secondary | ICD-10-CM | POA: Insufficient documentation

## 2017-12-23 DIAGNOSIS — J449 Chronic obstructive pulmonary disease, unspecified: Secondary | ICD-10-CM | POA: Diagnosis not present

## 2017-12-23 LAB — COMPREHENSIVE METABOLIC PANEL
ALT: 18 U/L (ref 0–44)
ANION GAP: 10 (ref 5–15)
AST: 23 U/L (ref 15–41)
Albumin: 4 g/dL (ref 3.5–5.0)
Alkaline Phosphatase: 58 U/L (ref 38–126)
BUN: 22 mg/dL — ABNORMAL HIGH (ref 6–20)
CHLORIDE: 103 mmol/L (ref 98–111)
CO2: 22 mmol/L (ref 22–32)
Calcium: 8.8 mg/dL — ABNORMAL LOW (ref 8.9–10.3)
Creatinine, Ser: 0.71 mg/dL (ref 0.61–1.24)
GFR calc Af Amer: >60 mL/min (ref >60)
GFR calc non Af Amer: >60 mL/min (ref >60)
Glucose, Bld: 212 mg/dL — ABNORMAL HIGH (ref 70–99)
Potassium: 4.9 mmol/L (ref 3.5–5.1)
SODIUM: 135 mmol/L (ref 135–145)
Total Bilirubin: 0.8 mg/dL (ref 0.3–1.2)
Total Protein: 6.2 g/dL — ABNORMAL LOW (ref 6.5–8.1)

## 2017-12-23 LAB — CBC
HCT: 36.5 % — ABNORMAL LOW (ref 40.0–52.0)
HEMOGLOBIN: 13.4 g/dL (ref 13.0–18.0)
MCH: 34.8 pg — ABNORMAL HIGH (ref 26.0–34.0)
MCHC: 36.6 g/dL — ABNORMAL HIGH (ref 32.0–36.0)
MCV: 95.1 fL (ref 80.0–100.0)
Platelets: 165 10*3/uL (ref 150–440)
RBC: 3.84 MIL/uL — ABNORMAL LOW (ref 4.40–5.90)
RDW: 13 % (ref 11.5–14.5)
WBC: 7.2 10*3/uL (ref 3.8–10.6)

## 2017-12-23 LAB — TROPONIN I

## 2017-12-23 MED ORDER — VALGANCICLOVIR HCL 450 MG PO TABS
900.0000 mg | ORAL_TABLET | Freq: Once | ORAL | Status: AC
  Administered 2017-12-23: 900 mg via ORAL
  Filled 2017-12-23: qty 2

## 2017-12-23 NOTE — ED Notes (Signed)
[facility]  CALLED  FOR  CONSULT  PER  DR  LORD  MD

## 2017-12-23 NOTE — Discharge Instructions (Addendum)
You are evaluated for chest discomfort and as we discussed I suspect you are having symptoms of acid reflux/GERD as well as nonspecific chest discomfort.  Although no certain cause was found, your exam and evaluation are overall reassuring in the emergency department today.  Return to the emergency department immediately for any worsening condition including no worsening chest pain, shortness of breath, trouble breathing, fevers, weakness, numbness, dizziness or passing out, or any other symptoms concerning to you.

## 2017-12-23 NOTE — ED Provider Notes (Signed)
[facility] Emergency Department Provider Note ____________________________________________   I have reviewed the triage vital signs and the triage nursing note.  HISTORY  Chief Complaint Chest Pain   Historian Patient and wife  HPI Kevin Boyd is a 52 y.o. male with a history of double lung transplant several months ago at [facility], states he is having some rejection, presents to the ED with a day and a half of intermittent indigestion described as burning in the epigastrium as well as overnight into today some sharp stabbing pains along the left lower rib margin.  He states that he thought these were due to the wires.  His wife is concerned about the indigestion being related to the possibility of heart attack.  States that they spoke with the nurse by phone who told him to come to the ED for further evaluation.  Pain is mild and prickly over the left lower chest wall and intermittent.  He has some shortness of breath, but no significant shortness of breath or pleuritic chest pain.  No fever.  No coughing or sputum production.     Past Medical History:  Diagnosis Date  . Asthma   . Bipolar 1 disorder (HCC)   . Borderline diabetes mellitus   . C. difficile diarrhea   . COPD (chronic obstructive pulmonary disease) (HCC)   . GERD (gastroesophageal reflux disease)   . Interstitial lung disease (HCC)   . Scoliosis   . Seasonal allergies   . Sleep apnea     Patient Active Problem List   Diagnosis Date Noted  . OSA (obstructive sleep apnea) 10/12/2016  . Hypertriglyceridemia 02/15/2016  . Prediabetes 02/15/2016  . Essential hypertension 02/15/2016  . Left hemiparesis (HCC)   . Hemiplegic migraine 02/13/2016  . Cough 04/25/2015  . CAP (community acquired pneumonia) 03/17/2015  . Migraine 11/05/2014  . Hyperlipidemia 11/04/2014  . Migraine headache 11/03/2014  . Borderline diabetes mellitus 11/03/2014  . Bipolar 1 disorder (HCC) 11/03/2014  .  Fatigue 10/05/2014  . GERD (gastroesophageal reflux disease) 06/22/2014  . Chest pain 04/06/2014  . Shortness of breath 03/04/2014  . Solitary pulmonary nodule 03/04/2014  . IPF (idiopathic pulmonary fibrosis) (HCC) 02/17/2014    Past Surgical History:  Procedure Laterality Date  . BACK SURGERY     lower lumbar fusion  . LUNG SURGERY     biopsy  . thumb surgery      Prior to Admission medications   Medication Sig Start Date End Date Taking? Authorizing Provider  albuterol (PROVENTIL HFA;VENTOLIN HFA) 108 (90 Base) MCG/ACT inhaler Inhale 2 puffs into the lungs every 6 (six) hours as needed for wheezing or shortness of breath. 09/18/16   [name], MD  albuterol (PROVENTIL) (2.5 MG/3ML) 0.083% nebulizer solution Take 3 mLs (2.5 mg total) by nebulization 2 (two) times daily. 05/30/17   [name], MD  aspirin EC 81 MG tablet Take 1 tablet (81 mg total) by mouth daily. 11/04/14   Shaune Pollack, MD  atorvastatin (LIPITOR) 40 MG tablet Take 1 tablet (40 mg total) by mouth daily at 6 PM. 11/04/14   Shaune Pollack, MD  Bioflavonoid Products (ESTER C PO) Take 1 tablet by mouth daily.    [provider]  carbamazepine (TEGRETOL XR) 100 MG 12 hr tablet Take 100-200 mg by mouth 2 (two) times daily. 2 tab qam, 2 tabs qhs    [provider]  chlorpheniramine-HYDROcodone (TUSSIONEX PENNKINETIC ER) 10-8 MG/5ML SUER Take 5 mLs by mouth every 12 (twelve) hours  as needed for cough. 05/30/17   [name], MD  Cholecalciferol (VITAMIN D-3) 5000 UNITS TABS Take 1 tablet by mouth daily.    [provider]  docusate sodium (COLACE) 100 MG capsule Take 100 mg by mouth at bedtime.    [provider]  doxycycline (VIBRA-TABS) 100 MG tablet Take 1 tablet (100 mg total) by mouth 2 (two) times daily. 04/01/17   Bevelyn Ngo, NP  esomeprazole (NEXIUM) 40 MG capsule TAKE 1 CAPSULE BY MOUTH 2 TIMES DAILY BEFORE A MEAL 07/26/17   [name], MD  ferrous gluconate  (FERGON) 324 MG tablet Take 324 mg by mouth every other day. At night    [provider]  lamoTRIgine (LAMICTAL) 200 MG tablet Take 200 mg by mouth 2 (two) times daily.    [provider]  predniSONE (DELTASONE) 20 MG tablet Take 1 tablet (20 mg total) by mouth daily with breakfast. 05/30/17   [name], MD  Probiotic Product (PROBIOTIC DAILY PO) Take 1 tablet by mouth daily.    [provider]  Respiratory Therapy Supplies (FLUTTER) DEVI Use as directed 03/17/15   [name], MD  sodium chloride HYPERTONIC 3 % nebulizer solution Take by nebulization 2 (two) times daily. 06/04/17   [name], MD    Allergies  Allergen Reactions  . Prograf [Tacrolimus] Other (See Comments)    Loses vision    Family History  Problem Relation Age of Onset  . Pulmonary fibrosis Mother   . Sjogren's syndrome Mother   . Pulmonary fibrosis Maternal Grandmother     Social History Social History   Tobacco Use  . Smoking status: Former Smoker    Packs/day: 2.00    Years: 30.00    Pack years: 60.00    Types: Cigarettes    Last attempt to quit: 12/28/2013    Years since quitting: 3.9  . Smokeless tobacco: Former Neurosurgeon  . Tobacco comment: quit 2 years ago  Substance Use Topics  . Alcohol use: Yes    Alcohol/week: 0.0 standard drinks    Comment: very occasional  . Drug use: Not on file    Review of Systems  Constitutional: Negative for fever. Eyes: Negative for visual changes. ENT: Negative for sore throat. Cardiovascular: Positive as per HPI for chest pain. Respiratory: Negative for shortness of breath. Gastrointestinal: Negative for abdominal pain, vomiting and diarrhea. Genitourinary: Negative for dysuria. Musculoskeletal: Negative for back pain. Skin: Negative for rash. Neurological: Negative for headache.  ____________________________________________   PHYSICAL EXAM:  VITAL SIGNS: ED Triage Vitals  Enc Vitals Group     BP 12/23/17  1143 116/75     Pulse Rate 12/23/17 1143 75     Resp 12/23/17 1143 20     Temp 12/23/17 1143 97.8 F (36.6 C)     Temp Source 12/23/17 1143 Oral     SpO2 12/23/17 1143 97 %     Weight 12/23/17 1144 265 lb (120.2 kg)     Height 12/23/17 1144 6\' 2"  (1.88 m)     Head Circumference --      Peak Flow --      Pain Score 12/23/17 1144 0     Pain Loc --      Pain Edu? --      Excl. in GC? --      Constitutional: Alert and oriented.  HEENT      Head: Normocephalic and atraumatic.      Eyes: Conjunctivae are normal.  Pupils equal and round.       Ears:         Nose: No congestion/rhinnorhea.      Mouth/Throat: Mucous membranes are moist.      Neck: No stridor. Cardiovascular/Chest: Normal rate, regular rhythm.  No murmurs, rubs, or gallops. Respiratory: Normal respiratory effort without tachypnea nor retractions. Breath sounds are clear and equal bilaterally. No wheezes/rales/rhonchi. Gastrointestinal: Soft. No distention, no guarding, no rebound. Nontender.    Genitourinary/rectal:Deferred Musculoskeletal: Nontender with normal range of motion in all extremities. No joint effusions.  No lower extremity tenderness.  No edema. Neurologic:  Normal speech and language. No gross or focal neurologic deficits are appreciated. Skin:  Skin is warm, dry and intact. No rash noted. Psychiatric: Mood and affect are normal. Speech and behavior are normal. Patient exhibits appropriate insight and judgment.   ____________________________________________  LABS (pertinent positives/negatives) I, Governor Rooksebecca Ann Bohne, MD the attending physician have reviewed the labs noted below.  Labs Reviewed  CBC - Abnormal; Notable for the following components:      Result Value   RBC 3.84 (*)    HCT 36.5 (*)    MCH 34.8 (*)    MCHC 36.6 (*)    All other components within normal limits  COMPREHENSIVE METABOLIC PANEL - Abnormal; Notable for the following components:   Glucose, Bld 212 (*)    BUN 22 (*)    Calcium  8.8 (*)    Total Protein 6.2 (*)    All other components within normal limits  TROPONIN I    ____________________________________________    EKG I, Governor Rooksebecca Octavio Matheney, MD, the attending physician have personally viewed and interpreted all ECGs.  77 bpm.  Normal sinus rhythm.  Narrow QRS.  Normal axis.  Nonspecific T wave LVH. ____________________________________________  RADIOLOGY   Chest x-ray two-view, reviewed by me, radiologist interpretation reviewed:IMPRESSION: Interval BILATERAL lung transplant.  No acute abnormalities. __________________________________________  PROCEDURES  Procedure(s) performed: None  Procedures  Critical Care performed: None   ____________________________________________  ED COURSE / ASSESSMENT AND PLAN  Pertinent labs & imaging results that were available during my care of the patient were reviewed by me and considered in my medical decision making (see chart for details).   Patient here for evaluation of a day and a half of GERD-like symptoms, as well as less than a day of sharp pricking pain along the left lower chest wall.  EKG is reassuring.  Initial description and laboratory studies are reassuring for low likelihood of ACS.  Initial troponin is negative.  Chest x-ray is clear without focal infiltrate.  Patient states his regular O2 sat is 96% and he is 96% here.  No fever tachycardia or pleuritic chest pain I think PE is unlikely.  [facility], does not sound like patient has any change or acute rejection issues, infection issues, and is not in any particular increased risk for PE.  Will not CT today.  Patient will be admitted on Thursday for regularly scheduled chronic rejection admission/infusion.    CONSULTATIONS: [facility] Oris DronePipeling.   Patient / Family / Caregiver informed of clinical course, medical decision-making process, and agree with plan.   I  discussed return precautions, follow-up instructions, and discharge instructions with patient and/or family.  Discharge Instructions : You are evaluated for chest discomfort and as we discussed I suspect you are having symptoms of acid reflux/GERD as well as nonspecific chest discomfort.  Although no certain cause  was found, your exam and evaluation are overall reassuring in the emergency department today.  Return to the emergency department immediately for any worsening condition including no worsening chest pain, shortness of breath, trouble breathing, fevers, weakness, numbness, dizziness or passing out, or any other symptoms concerning to you.    ___________________________________________   FINAL CLINICAL IMPRESSION(S) / ED DIAGNOSES   Final diagnoses:  Nonspecific chest pain  Gastroesophageal reflux disease, esophagitis presence not specified      ___________________________________________         Note: This dictation was prepared with Dragon dictation. Any transcriptional errors that result from this process are unintentional    Governor Rooks, MD 12/23/17 1651

## 2017-12-23 NOTE — ED Triage Notes (Signed)
First Nurse Note:  Chest Pain and indigestion x 3 days.

## 2017-12-23 NOTE — ED Triage Notes (Signed)
Chest pain since yesterday, history of bilateral lung transplant in April, in stage 3 rejection.

## 2017-12-26 DIAGNOSIS — T782XXD Anaphylactic shock, unspecified, subsequent encounter: Secondary | ICD-10-CM | POA: Diagnosis not present

## 2017-12-26 DIAGNOSIS — E872 Acidosis: Secondary | ICD-10-CM | POA: Diagnosis not present

## 2017-12-26 DIAGNOSIS — E119 Type 2 diabetes mellitus without complications: Secondary | ICD-10-CM | POA: Diagnosis not present

## 2017-12-26 DIAGNOSIS — T50Z15A Adverse effect of immunoglobulin, initial encounter: Secondary | ICD-10-CM | POA: Diagnosis not present

## 2017-12-26 DIAGNOSIS — E782 Mixed hyperlipidemia: Secondary | ICD-10-CM | POA: Diagnosis not present

## 2017-12-26 DIAGNOSIS — I10 Essential (primary) hypertension: Secondary | ICD-10-CM | POA: Diagnosis not present

## 2017-12-26 DIAGNOSIS — E1122 Type 2 diabetes mellitus with diabetic chronic kidney disease: Secondary | ICD-10-CM | POA: Diagnosis not present

## 2017-12-26 DIAGNOSIS — K219 Gastro-esophageal reflux disease without esophagitis: Secondary | ICD-10-CM | POA: Diagnosis not present

## 2017-12-26 DIAGNOSIS — T380X5A Adverse effect of glucocorticoids and synthetic analogues, initial encounter: Secondary | ICD-10-CM | POA: Diagnosis not present

## 2017-12-26 DIAGNOSIS — R251 Tremor, unspecified: Secondary | ICD-10-CM | POA: Diagnosis not present

## 2017-12-26 DIAGNOSIS — Z452 Encounter for adjustment and management of vascular access device: Secondary | ICD-10-CM | POA: Diagnosis not present

## 2017-12-26 DIAGNOSIS — F319 Bipolar disorder, unspecified: Secondary | ICD-10-CM | POA: Diagnosis not present

## 2017-12-26 DIAGNOSIS — Z87891 Personal history of nicotine dependence: Secondary | ICD-10-CM | POA: Diagnosis not present

## 2017-12-26 DIAGNOSIS — J8410 Pulmonary fibrosis, unspecified: Secondary | ICD-10-CM | POA: Diagnosis not present

## 2017-12-26 DIAGNOSIS — N182 Chronic kidney disease, stage 2 (mild): Secondary | ICD-10-CM | POA: Diagnosis not present

## 2017-12-26 DIAGNOSIS — Z794 Long term (current) use of insulin: Secondary | ICD-10-CM | POA: Diagnosis not present

## 2017-12-26 DIAGNOSIS — J449 Chronic obstructive pulmonary disease, unspecified: Secondary | ICD-10-CM | POA: Diagnosis not present

## 2017-12-26 DIAGNOSIS — R918 Other nonspecific abnormal finding of lung field: Secondary | ICD-10-CM | POA: Diagnosis not present

## 2017-12-26 DIAGNOSIS — E1169 Type 2 diabetes mellitus with other specified complication: Secondary | ICD-10-CM | POA: Diagnosis not present

## 2017-12-26 DIAGNOSIS — T886XXA Anaphylactic reaction due to adverse effect of correct drug or medicament properly administered, initial encounter: Secondary | ICD-10-CM | POA: Diagnosis not present

## 2017-12-26 DIAGNOSIS — G4733 Obstructive sleep apnea (adult) (pediatric): Secondary | ICD-10-CM | POA: Diagnosis not present

## 2017-12-26 DIAGNOSIS — T782XXA Anaphylactic shock, unspecified, initial encounter: Secondary | ICD-10-CM | POA: Diagnosis not present

## 2017-12-26 DIAGNOSIS — R739 Hyperglycemia, unspecified: Secondary | ICD-10-CM | POA: Diagnosis not present

## 2017-12-26 DIAGNOSIS — J9600 Acute respiratory failure, unspecified whether with hypoxia or hypercapnia: Secondary | ICD-10-CM | POA: Diagnosis not present

## 2017-12-26 DIAGNOSIS — Z942 Lung transplant status: Secondary | ICD-10-CM | POA: Diagnosis not present

## 2017-12-26 DIAGNOSIS — D649 Anemia, unspecified: Secondary | ICD-10-CM | POA: Diagnosis not present

## 2017-12-26 DIAGNOSIS — Z915 Personal history of self-harm: Secondary | ICD-10-CM | POA: Diagnosis not present

## 2017-12-26 DIAGNOSIS — T86810 Lung transplant rejection: Secondary | ICD-10-CM | POA: Diagnosis not present

## 2017-12-26 DIAGNOSIS — R0603 Acute respiratory distress: Secondary | ICD-10-CM | POA: Diagnosis not present

## 2017-12-27 DIAGNOSIS — J9600 Acute respiratory failure, unspecified whether with hypoxia or hypercapnia: Secondary | ICD-10-CM | POA: Diagnosis not present

## 2017-12-27 DIAGNOSIS — R918 Other nonspecific abnormal finding of lung field: Secondary | ICD-10-CM | POA: Diagnosis not present

## 2017-12-27 DIAGNOSIS — R0603 Acute respiratory distress: Secondary | ICD-10-CM | POA: Diagnosis not present

## 2017-12-27 DIAGNOSIS — Z942 Lung transplant status: Secondary | ICD-10-CM | POA: Diagnosis not present

## 2017-12-27 DIAGNOSIS — Z452 Encounter for adjustment and management of vascular access device: Secondary | ICD-10-CM | POA: Diagnosis not present

## 2017-12-27 DIAGNOSIS — T782XXA Anaphylactic shock, unspecified, initial encounter: Secondary | ICD-10-CM | POA: Diagnosis not present

## 2017-12-27 DIAGNOSIS — J8410 Pulmonary fibrosis, unspecified: Secondary | ICD-10-CM | POA: Diagnosis not present

## 2017-12-28 DIAGNOSIS — T86810 Lung transplant rejection: Secondary | ICD-10-CM | POA: Diagnosis not present

## 2017-12-28 DIAGNOSIS — T782XXA Anaphylactic shock, unspecified, initial encounter: Secondary | ICD-10-CM | POA: Diagnosis not present

## 2017-12-28 DIAGNOSIS — R0603 Acute respiratory distress: Secondary | ICD-10-CM | POA: Diagnosis not present

## 2017-12-29 DIAGNOSIS — R0603 Acute respiratory distress: Secondary | ICD-10-CM | POA: Diagnosis not present

## 2017-12-29 DIAGNOSIS — N182 Chronic kidney disease, stage 2 (mild): Secondary | ICD-10-CM | POA: Diagnosis not present

## 2017-12-29 DIAGNOSIS — T782XXD Anaphylactic shock, unspecified, subsequent encounter: Secondary | ICD-10-CM | POA: Diagnosis not present

## 2017-12-29 DIAGNOSIS — T86810 Lung transplant rejection: Secondary | ICD-10-CM | POA: Diagnosis not present

## 2017-12-30 DIAGNOSIS — F319 Bipolar disorder, unspecified: Secondary | ICD-10-CM | POA: Diagnosis not present

## 2017-12-30 DIAGNOSIS — J8410 Pulmonary fibrosis, unspecified: Secondary | ICD-10-CM | POA: Diagnosis not present

## 2017-12-30 DIAGNOSIS — T782XXA Anaphylactic shock, unspecified, initial encounter: Secondary | ICD-10-CM | POA: Diagnosis not present

## 2017-12-30 DIAGNOSIS — T782XXD Anaphylactic shock, unspecified, subsequent encounter: Secondary | ICD-10-CM | POA: Diagnosis not present

## 2017-12-30 DIAGNOSIS — D649 Anemia, unspecified: Secondary | ICD-10-CM | POA: Diagnosis not present

## 2017-12-30 DIAGNOSIS — R918 Other nonspecific abnormal finding of lung field: Secondary | ICD-10-CM | POA: Diagnosis not present

## 2017-12-30 DIAGNOSIS — Z942 Lung transplant status: Secondary | ICD-10-CM | POA: Diagnosis not present

## 2017-12-30 DIAGNOSIS — N182 Chronic kidney disease, stage 2 (mild): Secondary | ICD-10-CM | POA: Diagnosis not present

## 2017-12-30 DIAGNOSIS — T86810 Lung transplant rejection: Secondary | ICD-10-CM | POA: Diagnosis not present

## 2017-12-31 DIAGNOSIS — J8410 Pulmonary fibrosis, unspecified: Secondary | ICD-10-CM | POA: Diagnosis not present

## 2018-01-01 DIAGNOSIS — J8410 Pulmonary fibrosis, unspecified: Secondary | ICD-10-CM | POA: Diagnosis not present

## 2018-01-02 DIAGNOSIS — E119 Type 2 diabetes mellitus without complications: Secondary | ICD-10-CM | POA: Diagnosis not present

## 2018-01-02 DIAGNOSIS — J8410 Pulmonary fibrosis, unspecified: Secondary | ICD-10-CM | POA: Diagnosis not present

## 2018-01-03 DIAGNOSIS — E119 Type 2 diabetes mellitus without complications: Secondary | ICD-10-CM | POA: Diagnosis not present

## 2018-01-03 DIAGNOSIS — J8410 Pulmonary fibrosis, unspecified: Secondary | ICD-10-CM | POA: Diagnosis not present

## 2018-01-03 DIAGNOSIS — Z452 Encounter for adjustment and management of vascular access device: Secondary | ICD-10-CM | POA: Diagnosis not present

## 2018-01-03 DIAGNOSIS — T380X5A Adverse effect of glucocorticoids and synthetic analogues, initial encounter: Secondary | ICD-10-CM | POA: Diagnosis not present

## 2018-01-03 DIAGNOSIS — R739 Hyperglycemia, unspecified: Secondary | ICD-10-CM | POA: Diagnosis not present

## 2018-01-03 DIAGNOSIS — Z942 Lung transplant status: Secondary | ICD-10-CM | POA: Diagnosis not present

## 2018-01-03 DIAGNOSIS — T86810 Lung transplant rejection: Secondary | ICD-10-CM | POA: Diagnosis not present

## 2018-01-04 DIAGNOSIS — J8410 Pulmonary fibrosis, unspecified: Secondary | ICD-10-CM | POA: Diagnosis not present

## 2018-01-05 DIAGNOSIS — Z942 Lung transplant status: Secondary | ICD-10-CM | POA: Diagnosis not present

## 2018-01-05 DIAGNOSIS — J8410 Pulmonary fibrosis, unspecified: Secondary | ICD-10-CM | POA: Diagnosis not present

## 2018-01-05 DIAGNOSIS — R739 Hyperglycemia, unspecified: Secondary | ICD-10-CM | POA: Diagnosis not present

## 2018-01-05 DIAGNOSIS — E1169 Type 2 diabetes mellitus with other specified complication: Secondary | ICD-10-CM | POA: Diagnosis not present

## 2018-01-05 DIAGNOSIS — T380X5A Adverse effect of glucocorticoids and synthetic analogues, initial encounter: Secondary | ICD-10-CM | POA: Diagnosis not present

## 2018-01-24 ENCOUNTER — Other Ambulatory Visit: Payer: Self-pay | Admitting: Pulmonary Disease

## 2018-01-29 DIAGNOSIS — Z942 Lung transplant status: Secondary | ICD-10-CM | POA: Diagnosis not present

## 2018-02-05 DIAGNOSIS — E669 Obesity, unspecified: Secondary | ICD-10-CM | POA: Diagnosis not present

## 2018-02-05 DIAGNOSIS — D899 Disorder involving the immune mechanism, unspecified: Secondary | ICD-10-CM | POA: Diagnosis not present

## 2018-02-05 DIAGNOSIS — E119 Type 2 diabetes mellitus without complications: Secondary | ICD-10-CM | POA: Diagnosis not present

## 2018-02-05 DIAGNOSIS — Z942 Lung transplant status: Secondary | ICD-10-CM | POA: Diagnosis not present

## 2018-02-05 DIAGNOSIS — Z7982 Long term (current) use of aspirin: Secondary | ICD-10-CM | POA: Diagnosis not present

## 2018-02-05 DIAGNOSIS — R0989 Other specified symptoms and signs involving the circulatory and respiratory systems: Secondary | ICD-10-CM | POA: Diagnosis not present

## 2018-02-05 DIAGNOSIS — G4733 Obstructive sleep apnea (adult) (pediatric): Secondary | ICD-10-CM | POA: Diagnosis not present

## 2018-02-05 DIAGNOSIS — Z8709 Personal history of other diseases of the respiratory system: Secondary | ICD-10-CM | POA: Diagnosis not present

## 2018-02-05 DIAGNOSIS — Z79899 Other long term (current) drug therapy: Secondary | ICD-10-CM | POA: Diagnosis not present

## 2018-02-05 DIAGNOSIS — F319 Bipolar disorder, unspecified: Secondary | ICD-10-CM | POA: Diagnosis not present

## 2018-02-05 DIAGNOSIS — Z4824 Encounter for aftercare following lung transplant: Secondary | ICD-10-CM | POA: Diagnosis not present

## 2018-02-05 DIAGNOSIS — I10 Essential (primary) hypertension: Secondary | ICD-10-CM | POA: Diagnosis not present

## 2018-02-05 DIAGNOSIS — Z794 Long term (current) use of insulin: Secondary | ICD-10-CM | POA: Diagnosis not present

## 2018-02-05 DIAGNOSIS — E785 Hyperlipidemia, unspecified: Secondary | ICD-10-CM | POA: Diagnosis not present

## 2018-02-05 DIAGNOSIS — K219 Gastro-esophageal reflux disease without esophagitis: Secondary | ICD-10-CM | POA: Diagnosis not present

## 2018-02-05 DIAGNOSIS — T86810 Lung transplant rejection: Secondary | ICD-10-CM | POA: Diagnosis not present

## 2018-02-05 DIAGNOSIS — Z87891 Personal history of nicotine dependence: Secondary | ICD-10-CM | POA: Diagnosis not present

## 2018-02-06 DIAGNOSIS — R739 Hyperglycemia, unspecified: Secondary | ICD-10-CM | POA: Diagnosis not present

## 2018-02-06 DIAGNOSIS — R7303 Prediabetes: Secondary | ICD-10-CM | POA: Diagnosis not present

## 2018-02-06 DIAGNOSIS — T380X5A Adverse effect of glucocorticoids and synthetic analogues, initial encounter: Secondary | ICD-10-CM | POA: Diagnosis not present

## 2018-02-06 DIAGNOSIS — Z942 Lung transplant status: Secondary | ICD-10-CM | POA: Diagnosis not present

## 2018-02-19 DIAGNOSIS — Z942 Lung transplant status: Secondary | ICD-10-CM | POA: Diagnosis not present

## 2018-03-28 DIAGNOSIS — Z942 Lung transplant status: Secondary | ICD-10-CM | POA: Diagnosis not present

## 2018-04-03 DIAGNOSIS — M47814 Spondylosis without myelopathy or radiculopathy, thoracic region: Secondary | ICD-10-CM | POA: Diagnosis not present

## 2018-04-03 DIAGNOSIS — Z4824 Encounter for aftercare following lung transplant: Secondary | ICD-10-CM | POA: Diagnosis not present

## 2018-04-03 DIAGNOSIS — Z942 Lung transplant status: Secondary | ICD-10-CM | POA: Diagnosis not present

## 2018-04-03 DIAGNOSIS — R197 Diarrhea, unspecified: Secondary | ICD-10-CM | POA: Diagnosis not present

## 2018-04-03 DIAGNOSIS — R140 Abdominal distension (gaseous): Secondary | ICD-10-CM | POA: Diagnosis not present

## 2018-04-03 DIAGNOSIS — J939 Pneumothorax, unspecified: Secondary | ICD-10-CM | POA: Diagnosis not present

## 2018-04-03 DIAGNOSIS — R432 Parageusia: Secondary | ICD-10-CM | POA: Diagnosis not present

## 2018-04-03 DIAGNOSIS — D899 Disorder involving the immune mechanism, unspecified: Secondary | ICD-10-CM | POA: Diagnosis not present

## 2018-04-03 DIAGNOSIS — R1084 Generalized abdominal pain: Secondary | ICD-10-CM | POA: Diagnosis not present

## 2018-04-03 DIAGNOSIS — R935 Abnormal findings on diagnostic imaging of other abdominal regions, including retroperitoneum: Secondary | ICD-10-CM | POA: Diagnosis not present

## 2018-04-03 DIAGNOSIS — N281 Cyst of kidney, acquired: Secondary | ICD-10-CM | POA: Diagnosis not present

## 2018-04-03 DIAGNOSIS — Z87891 Personal history of nicotine dependence: Secondary | ICD-10-CM | POA: Diagnosis not present

## 2018-04-07 DIAGNOSIS — Z942 Lung transplant status: Secondary | ICD-10-CM | POA: Diagnosis not present

## 2018-04-07 DIAGNOSIS — R739 Hyperglycemia, unspecified: Secondary | ICD-10-CM | POA: Diagnosis not present

## 2018-04-07 DIAGNOSIS — E669 Obesity, unspecified: Secondary | ICD-10-CM | POA: Diagnosis not present

## 2018-04-07 DIAGNOSIS — R933 Abnormal findings on diagnostic imaging of other parts of digestive tract: Secondary | ICD-10-CM | POA: Diagnosis not present

## 2018-04-17 DIAGNOSIS — S27331A Laceration of lung, unilateral, initial encounter: Secondary | ICD-10-CM | POA: Diagnosis not present

## 2018-04-17 DIAGNOSIS — J939 Pneumothorax, unspecified: Secondary | ICD-10-CM | POA: Diagnosis not present

## 2018-04-17 DIAGNOSIS — T86818 Other complications of lung transplant: Secondary | ICD-10-CM | POA: Diagnosis not present

## 2018-04-17 DIAGNOSIS — Z942 Lung transplant status: Secondary | ICD-10-CM | POA: Diagnosis not present

## 2018-04-17 DIAGNOSIS — Z87891 Personal history of nicotine dependence: Secondary | ICD-10-CM | POA: Diagnosis not present

## 2018-04-17 DIAGNOSIS — R0789 Other chest pain: Secondary | ICD-10-CM | POA: Diagnosis not present

## 2018-04-17 DIAGNOSIS — T797XXA Traumatic subcutaneous emphysema, initial encounter: Secondary | ICD-10-CM | POA: Diagnosis not present

## 2018-04-18 DIAGNOSIS — F319 Bipolar disorder, unspecified: Secondary | ICD-10-CM | POA: Diagnosis not present

## 2018-04-18 DIAGNOSIS — Z8709 Personal history of other diseases of the respiratory system: Secondary | ICD-10-CM | POA: Diagnosis not present

## 2018-04-18 DIAGNOSIS — E669 Obesity, unspecified: Secondary | ICD-10-CM | POA: Diagnosis not present

## 2018-04-18 DIAGNOSIS — Z942 Lung transplant status: Secondary | ICD-10-CM | POA: Diagnosis not present

## 2018-04-18 DIAGNOSIS — Z79899 Other long term (current) drug therapy: Secondary | ICD-10-CM | POA: Diagnosis not present

## 2018-04-18 DIAGNOSIS — J209 Acute bronchitis, unspecified: Secondary | ICD-10-CM | POA: Diagnosis not present

## 2018-04-18 DIAGNOSIS — D899 Disorder involving the immune mechanism, unspecified: Secondary | ICD-10-CM | POA: Diagnosis not present

## 2018-04-18 DIAGNOSIS — T86818 Other complications of lung transplant: Secondary | ICD-10-CM | POA: Diagnosis not present

## 2018-04-18 DIAGNOSIS — Z7982 Long term (current) use of aspirin: Secondary | ICD-10-CM | POA: Diagnosis not present

## 2018-04-18 DIAGNOSIS — E119 Type 2 diabetes mellitus without complications: Secondary | ICD-10-CM | POA: Diagnosis not present

## 2018-04-18 DIAGNOSIS — E785 Hyperlipidemia, unspecified: Secondary | ICD-10-CM | POA: Diagnosis not present

## 2018-04-18 DIAGNOSIS — Z794 Long term (current) use of insulin: Secondary | ICD-10-CM | POA: Diagnosis not present

## 2018-04-22 DIAGNOSIS — S27331A Laceration of lung, unilateral, initial encounter: Secondary | ICD-10-CM | POA: Diagnosis not present

## 2018-04-22 DIAGNOSIS — R739 Hyperglycemia, unspecified: Secondary | ICD-10-CM | POA: Diagnosis not present

## 2018-04-22 DIAGNOSIS — J439 Emphysema, unspecified: Secondary | ICD-10-CM | POA: Diagnosis not present

## 2018-04-22 DIAGNOSIS — R918 Other nonspecific abnormal finding of lung field: Secondary | ICD-10-CM | POA: Diagnosis not present

## 2018-04-22 DIAGNOSIS — E669 Obesity, unspecified: Secondary | ICD-10-CM | POA: Diagnosis not present

## 2018-04-22 DIAGNOSIS — T86810 Lung transplant rejection: Secondary | ICD-10-CM | POA: Diagnosis not present

## 2018-04-22 DIAGNOSIS — Z9981 Dependence on supplemental oxygen: Secondary | ICD-10-CM | POA: Diagnosis not present

## 2018-04-22 DIAGNOSIS — T84218A Breakdown (mechanical) of internal fixation device of other bones, initial encounter: Secondary | ICD-10-CM | POA: Diagnosis not present

## 2018-04-22 DIAGNOSIS — N200 Calculus of kidney: Secondary | ICD-10-CM | POA: Diagnosis not present

## 2018-04-22 DIAGNOSIS — F064 Anxiety disorder due to known physiological condition: Secondary | ICD-10-CM | POA: Diagnosis not present

## 2018-04-22 DIAGNOSIS — S2220XK Unspecified fracture of sternum, subsequent encounter for fracture with nonunion: Secondary | ICD-10-CM | POA: Diagnosis not present

## 2018-04-22 DIAGNOSIS — N182 Chronic kidney disease, stage 2 (mild): Secondary | ICD-10-CM | POA: Diagnosis not present

## 2018-04-22 DIAGNOSIS — M7989 Other specified soft tissue disorders: Secondary | ICD-10-CM | POA: Diagnosis not present

## 2018-04-22 DIAGNOSIS — J90 Pleural effusion, not elsewhere classified: Secondary | ICD-10-CM | POA: Diagnosis not present

## 2018-04-22 DIAGNOSIS — D899 Disorder involving the immune mechanism, unspecified: Secondary | ICD-10-CM | POA: Diagnosis not present

## 2018-04-22 DIAGNOSIS — J9611 Chronic respiratory failure with hypoxia: Secondary | ICD-10-CM | POA: Diagnosis not present

## 2018-04-22 DIAGNOSIS — T8182XA Emphysema (subcutaneous) resulting from a procedure, initial encounter: Secondary | ICD-10-CM | POA: Diagnosis not present

## 2018-04-22 DIAGNOSIS — G8918 Other acute postprocedural pain: Secondary | ICD-10-CM | POA: Diagnosis not present

## 2018-04-22 DIAGNOSIS — Z942 Lung transplant status: Secondary | ICD-10-CM | POA: Diagnosis not present

## 2018-04-22 DIAGNOSIS — M25461 Effusion, right knee: Secondary | ICD-10-CM | POA: Diagnosis not present

## 2018-04-22 DIAGNOSIS — R442 Other hallucinations: Secondary | ICD-10-CM | POA: Diagnosis not present

## 2018-04-22 DIAGNOSIS — T380X5A Adverse effect of glucocorticoids and synthetic analogues, initial encounter: Secondary | ICD-10-CM | POA: Diagnosis not present

## 2018-04-22 DIAGNOSIS — R112 Nausea with vomiting, unspecified: Secondary | ICD-10-CM | POA: Diagnosis not present

## 2018-04-22 DIAGNOSIS — T797XXA Traumatic subcutaneous emphysema, initial encounter: Secondary | ICD-10-CM | POA: Diagnosis not present

## 2018-04-22 DIAGNOSIS — E1169 Type 2 diabetes mellitus with other specified complication: Secondary | ICD-10-CM | POA: Diagnosis not present

## 2018-04-22 DIAGNOSIS — E0801 Diabetes mellitus due to underlying condition with hyperosmolarity with coma: Secondary | ICD-10-CM | POA: Diagnosis not present

## 2018-04-22 DIAGNOSIS — Z4824 Encounter for aftercare following lung transplant: Secondary | ICD-10-CM | POA: Diagnosis not present

## 2018-04-22 DIAGNOSIS — J9811 Atelectasis: Secondary | ICD-10-CM | POA: Diagnosis not present

## 2018-04-22 DIAGNOSIS — T86818 Other complications of lung transplant: Secondary | ICD-10-CM | POA: Diagnosis not present

## 2018-04-22 DIAGNOSIS — D8989 Other specified disorders involving the immune mechanism, not elsewhere classified: Secondary | ICD-10-CM | POA: Diagnosis not present

## 2018-04-22 DIAGNOSIS — N179 Acute kidney failure, unspecified: Secondary | ICD-10-CM | POA: Diagnosis not present

## 2018-04-22 DIAGNOSIS — R140 Abdominal distension (gaseous): Secondary | ICD-10-CM | POA: Diagnosis not present

## 2018-04-22 DIAGNOSIS — R441 Visual hallucinations: Secondary | ICD-10-CM | POA: Diagnosis not present

## 2018-04-22 DIAGNOSIS — E875 Hyperkalemia: Secondary | ICD-10-CM | POA: Diagnosis not present

## 2018-04-22 DIAGNOSIS — J939 Pneumothorax, unspecified: Secondary | ICD-10-CM | POA: Diagnosis not present

## 2018-04-22 DIAGNOSIS — E871 Hypo-osmolality and hyponatremia: Secondary | ICD-10-CM | POA: Diagnosis not present

## 2018-04-22 DIAGNOSIS — T84218S Breakdown (mechanical) of internal fixation device of other bones, sequela: Secondary | ICD-10-CM | POA: Diagnosis not present

## 2018-04-22 DIAGNOSIS — R1312 Dysphagia, oropharyngeal phase: Secondary | ICD-10-CM | POA: Diagnosis not present

## 2018-04-22 DIAGNOSIS — E1165 Type 2 diabetes mellitus with hyperglycemia: Secondary | ICD-10-CM | POA: Diagnosis not present

## 2018-04-22 DIAGNOSIS — R0602 Shortness of breath: Secondary | ICD-10-CM | POA: Diagnosis not present

## 2018-04-22 DIAGNOSIS — R0902 Hypoxemia: Secondary | ICD-10-CM | POA: Diagnosis not present

## 2018-04-22 DIAGNOSIS — F319 Bipolar disorder, unspecified: Secondary | ICD-10-CM | POA: Diagnosis not present

## 2018-04-22 DIAGNOSIS — G4733 Obstructive sleep apnea (adult) (pediatric): Secondary | ICD-10-CM | POA: Diagnosis not present

## 2018-04-22 DIAGNOSIS — Z794 Long term (current) use of insulin: Secondary | ICD-10-CM | POA: Diagnosis not present

## 2018-04-23 DIAGNOSIS — R918 Other nonspecific abnormal finding of lung field: Secondary | ICD-10-CM | POA: Diagnosis not present

## 2018-04-23 DIAGNOSIS — Z942 Lung transplant status: Secondary | ICD-10-CM | POA: Diagnosis not present

## 2018-04-23 DIAGNOSIS — R112 Nausea with vomiting, unspecified: Secondary | ICD-10-CM | POA: Diagnosis not present

## 2018-04-23 DIAGNOSIS — T84218S Breakdown (mechanical) of internal fixation device of other bones, sequela: Secondary | ICD-10-CM | POA: Diagnosis not present

## 2018-04-24 ENCOUNTER — Other Ambulatory Visit: Payer: Self-pay | Admitting: Pulmonary Disease

## 2018-04-24 DIAGNOSIS — R112 Nausea with vomiting, unspecified: Secondary | ICD-10-CM | POA: Diagnosis not present

## 2018-04-24 DIAGNOSIS — J939 Pneumothorax, unspecified: Secondary | ICD-10-CM | POA: Diagnosis not present

## 2018-04-24 DIAGNOSIS — N200 Calculus of kidney: Secondary | ICD-10-CM | POA: Diagnosis not present

## 2018-04-24 DIAGNOSIS — R918 Other nonspecific abnormal finding of lung field: Secondary | ICD-10-CM | POA: Diagnosis not present

## 2018-04-24 DIAGNOSIS — Z942 Lung transplant status: Secondary | ICD-10-CM | POA: Diagnosis not present

## 2018-04-24 DIAGNOSIS — J439 Emphysema, unspecified: Secondary | ICD-10-CM | POA: Diagnosis not present

## 2018-04-25 DIAGNOSIS — T84218A Breakdown (mechanical) of internal fixation device of other bones, initial encounter: Secondary | ICD-10-CM | POA: Diagnosis not present

## 2018-04-25 DIAGNOSIS — Z942 Lung transplant status: Secondary | ICD-10-CM | POA: Diagnosis not present

## 2018-04-25 DIAGNOSIS — Z4824 Encounter for aftercare following lung transplant: Secondary | ICD-10-CM | POA: Diagnosis not present

## 2018-04-25 DIAGNOSIS — R918 Other nonspecific abnormal finding of lung field: Secondary | ICD-10-CM | POA: Diagnosis not present

## 2018-04-26 DIAGNOSIS — Z942 Lung transplant status: Secondary | ICD-10-CM | POA: Diagnosis not present

## 2018-04-26 DIAGNOSIS — R918 Other nonspecific abnormal finding of lung field: Secondary | ICD-10-CM | POA: Diagnosis not present

## 2018-04-26 DIAGNOSIS — J9811 Atelectasis: Secondary | ICD-10-CM | POA: Diagnosis not present

## 2018-04-26 DIAGNOSIS — J90 Pleural effusion, not elsewhere classified: Secondary | ICD-10-CM | POA: Diagnosis not present

## 2018-04-27 DIAGNOSIS — J90 Pleural effusion, not elsewhere classified: Secondary | ICD-10-CM | POA: Diagnosis not present

## 2018-04-27 DIAGNOSIS — G8918 Other acute postprocedural pain: Secondary | ICD-10-CM | POA: Diagnosis not present

## 2018-04-27 DIAGNOSIS — Z942 Lung transplant status: Secondary | ICD-10-CM | POA: Diagnosis not present

## 2018-04-28 DIAGNOSIS — R739 Hyperglycemia, unspecified: Secondary | ICD-10-CM | POA: Diagnosis not present

## 2018-04-28 DIAGNOSIS — E669 Obesity, unspecified: Secondary | ICD-10-CM | POA: Diagnosis not present

## 2018-04-28 DIAGNOSIS — G8918 Other acute postprocedural pain: Secondary | ICD-10-CM | POA: Diagnosis not present

## 2018-04-28 DIAGNOSIS — J90 Pleural effusion, not elsewhere classified: Secondary | ICD-10-CM | POA: Diagnosis not present

## 2018-04-28 DIAGNOSIS — E1165 Type 2 diabetes mellitus with hyperglycemia: Secondary | ICD-10-CM | POA: Diagnosis not present

## 2018-04-28 DIAGNOSIS — Z942 Lung transplant status: Secondary | ICD-10-CM | POA: Diagnosis not present

## 2018-04-28 DIAGNOSIS — Z794 Long term (current) use of insulin: Secondary | ICD-10-CM | POA: Diagnosis not present

## 2018-04-29 DIAGNOSIS — N179 Acute kidney failure, unspecified: Secondary | ICD-10-CM | POA: Diagnosis not present

## 2018-04-29 DIAGNOSIS — J90 Pleural effusion, not elsewhere classified: Secondary | ICD-10-CM | POA: Diagnosis not present

## 2018-04-29 DIAGNOSIS — J9811 Atelectasis: Secondary | ICD-10-CM | POA: Diagnosis not present

## 2018-04-29 DIAGNOSIS — J939 Pneumothorax, unspecified: Secondary | ICD-10-CM | POA: Diagnosis not present

## 2018-04-29 DIAGNOSIS — E1165 Type 2 diabetes mellitus with hyperglycemia: Secondary | ICD-10-CM | POA: Diagnosis not present

## 2018-04-29 DIAGNOSIS — R918 Other nonspecific abnormal finding of lung field: Secondary | ICD-10-CM | POA: Diagnosis not present

## 2018-04-29 DIAGNOSIS — F319 Bipolar disorder, unspecified: Secondary | ICD-10-CM | POA: Diagnosis not present

## 2018-04-29 DIAGNOSIS — R441 Visual hallucinations: Secondary | ICD-10-CM | POA: Diagnosis not present

## 2018-04-29 DIAGNOSIS — T8182XA Emphysema (subcutaneous) resulting from a procedure, initial encounter: Secondary | ICD-10-CM | POA: Diagnosis not present

## 2018-04-29 DIAGNOSIS — G8918 Other acute postprocedural pain: Secondary | ICD-10-CM | POA: Diagnosis not present

## 2018-04-29 DIAGNOSIS — Z794 Long term (current) use of insulin: Secondary | ICD-10-CM | POA: Diagnosis not present

## 2018-04-29 DIAGNOSIS — E669 Obesity, unspecified: Secondary | ICD-10-CM | POA: Diagnosis not present

## 2018-04-29 DIAGNOSIS — R739 Hyperglycemia, unspecified: Secondary | ICD-10-CM | POA: Diagnosis not present

## 2018-04-29 DIAGNOSIS — F064 Anxiety disorder due to known physiological condition: Secondary | ICD-10-CM | POA: Diagnosis not present

## 2018-04-29 DIAGNOSIS — Z942 Lung transplant status: Secondary | ICD-10-CM | POA: Diagnosis not present

## 2018-04-30 DIAGNOSIS — F319 Bipolar disorder, unspecified: Secondary | ICD-10-CM | POA: Diagnosis not present

## 2018-04-30 DIAGNOSIS — F064 Anxiety disorder due to known physiological condition: Secondary | ICD-10-CM | POA: Diagnosis not present

## 2018-04-30 DIAGNOSIS — J90 Pleural effusion, not elsewhere classified: Secondary | ICD-10-CM | POA: Diagnosis not present

## 2018-04-30 DIAGNOSIS — Z942 Lung transplant status: Secondary | ICD-10-CM | POA: Diagnosis not present

## 2018-04-30 DIAGNOSIS — M25461 Effusion, right knee: Secondary | ICD-10-CM | POA: Diagnosis not present

## 2018-04-30 DIAGNOSIS — R918 Other nonspecific abnormal finding of lung field: Secondary | ICD-10-CM | POA: Diagnosis not present

## 2018-04-30 DIAGNOSIS — R441 Visual hallucinations: Secondary | ICD-10-CM | POA: Diagnosis not present

## 2018-04-30 DIAGNOSIS — G8918 Other acute postprocedural pain: Secondary | ICD-10-CM | POA: Diagnosis not present

## 2018-04-30 DIAGNOSIS — N179 Acute kidney failure, unspecified: Secondary | ICD-10-CM | POA: Diagnosis not present

## 2018-05-01 DIAGNOSIS — J9811 Atelectasis: Secondary | ICD-10-CM | POA: Diagnosis not present

## 2018-05-01 DIAGNOSIS — G8918 Other acute postprocedural pain: Secondary | ICD-10-CM | POA: Diagnosis not present

## 2018-05-01 DIAGNOSIS — F319 Bipolar disorder, unspecified: Secondary | ICD-10-CM | POA: Diagnosis not present

## 2018-05-01 DIAGNOSIS — Z942 Lung transplant status: Secondary | ICD-10-CM | POA: Diagnosis not present

## 2018-05-01 DIAGNOSIS — T380X5A Adverse effect of glucocorticoids and synthetic analogues, initial encounter: Secondary | ICD-10-CM | POA: Diagnosis not present

## 2018-05-01 DIAGNOSIS — J90 Pleural effusion, not elsewhere classified: Secondary | ICD-10-CM | POA: Diagnosis not present

## 2018-05-01 DIAGNOSIS — E1169 Type 2 diabetes mellitus with other specified complication: Secondary | ICD-10-CM | POA: Diagnosis not present

## 2018-05-01 DIAGNOSIS — R739 Hyperglycemia, unspecified: Secondary | ICD-10-CM | POA: Diagnosis not present

## 2018-05-01 DIAGNOSIS — F064 Anxiety disorder due to known physiological condition: Secondary | ICD-10-CM | POA: Diagnosis not present

## 2018-05-02 DIAGNOSIS — F064 Anxiety disorder due to known physiological condition: Secondary | ICD-10-CM | POA: Diagnosis not present

## 2018-05-02 DIAGNOSIS — N179 Acute kidney failure, unspecified: Secondary | ICD-10-CM | POA: Diagnosis not present

## 2018-05-02 DIAGNOSIS — R441 Visual hallucinations: Secondary | ICD-10-CM | POA: Diagnosis not present

## 2018-05-02 DIAGNOSIS — F319 Bipolar disorder, unspecified: Secondary | ICD-10-CM | POA: Diagnosis not present

## 2018-05-02 DIAGNOSIS — J90 Pleural effusion, not elsewhere classified: Secondary | ICD-10-CM | POA: Diagnosis not present

## 2018-05-02 DIAGNOSIS — R918 Other nonspecific abnormal finding of lung field: Secondary | ICD-10-CM | POA: Diagnosis not present

## 2018-05-02 DIAGNOSIS — Z942 Lung transplant status: Secondary | ICD-10-CM | POA: Diagnosis not present

## 2018-05-03 DIAGNOSIS — J90 Pleural effusion, not elsewhere classified: Secondary | ICD-10-CM | POA: Diagnosis not present

## 2018-05-03 DIAGNOSIS — R918 Other nonspecific abnormal finding of lung field: Secondary | ICD-10-CM | POA: Diagnosis not present

## 2018-05-03 DIAGNOSIS — Z942 Lung transplant status: Secondary | ICD-10-CM | POA: Diagnosis not present

## 2018-05-04 DIAGNOSIS — J9811 Atelectasis: Secondary | ICD-10-CM | POA: Diagnosis not present

## 2018-05-04 DIAGNOSIS — G8918 Other acute postprocedural pain: Secondary | ICD-10-CM | POA: Diagnosis not present

## 2018-05-04 DIAGNOSIS — Z942 Lung transplant status: Secondary | ICD-10-CM | POA: Diagnosis not present

## 2018-05-04 DIAGNOSIS — J90 Pleural effusion, not elsewhere classified: Secondary | ICD-10-CM | POA: Diagnosis not present

## 2018-05-05 DIAGNOSIS — F064 Anxiety disorder due to known physiological condition: Secondary | ICD-10-CM | POA: Diagnosis not present

## 2018-05-05 DIAGNOSIS — Z942 Lung transplant status: Secondary | ICD-10-CM | POA: Diagnosis not present

## 2018-05-05 DIAGNOSIS — R1312 Dysphagia, oropharyngeal phase: Secondary | ICD-10-CM | POA: Diagnosis not present

## 2018-05-05 DIAGNOSIS — J90 Pleural effusion, not elsewhere classified: Secondary | ICD-10-CM | POA: Diagnosis not present

## 2018-05-05 DIAGNOSIS — G4733 Obstructive sleep apnea (adult) (pediatric): Secondary | ICD-10-CM | POA: Diagnosis not present

## 2018-05-05 DIAGNOSIS — F319 Bipolar disorder, unspecified: Secondary | ICD-10-CM | POA: Diagnosis not present

## 2018-05-06 DIAGNOSIS — Z942 Lung transplant status: Secondary | ICD-10-CM | POA: Diagnosis not present

## 2018-05-06 DIAGNOSIS — J90 Pleural effusion, not elsewhere classified: Secondary | ICD-10-CM | POA: Diagnosis not present

## 2018-05-07 DIAGNOSIS — F064 Anxiety disorder due to known physiological condition: Secondary | ICD-10-CM | POA: Diagnosis not present

## 2018-05-07 DIAGNOSIS — F319 Bipolar disorder, unspecified: Secondary | ICD-10-CM | POA: Diagnosis not present

## 2018-05-07 DIAGNOSIS — Z942 Lung transplant status: Secondary | ICD-10-CM | POA: Diagnosis not present

## 2018-05-07 DIAGNOSIS — G4733 Obstructive sleep apnea (adult) (pediatric): Secondary | ICD-10-CM | POA: Diagnosis not present

## 2018-05-08 DIAGNOSIS — J90 Pleural effusion, not elsewhere classified: Secondary | ICD-10-CM | POA: Diagnosis not present

## 2018-05-08 DIAGNOSIS — Z942 Lung transplant status: Secondary | ICD-10-CM | POA: Diagnosis not present

## 2018-05-08 DIAGNOSIS — J9811 Atelectasis: Secondary | ICD-10-CM | POA: Diagnosis not present

## 2018-05-08 DIAGNOSIS — R1312 Dysphagia, oropharyngeal phase: Secondary | ICD-10-CM | POA: Diagnosis not present

## 2018-05-09 DIAGNOSIS — Z942 Lung transplant status: Secondary | ICD-10-CM | POA: Diagnosis not present

## 2018-05-09 DIAGNOSIS — E0801 Diabetes mellitus due to underlying condition with hyperosmolarity with coma: Secondary | ICD-10-CM | POA: Diagnosis not present

## 2018-05-09 DIAGNOSIS — J90 Pleural effusion, not elsewhere classified: Secondary | ICD-10-CM | POA: Diagnosis not present

## 2018-05-10 DIAGNOSIS — J90 Pleural effusion, not elsewhere classified: Secondary | ICD-10-CM | POA: Diagnosis not present

## 2018-05-10 DIAGNOSIS — R918 Other nonspecific abnormal finding of lung field: Secondary | ICD-10-CM | POA: Diagnosis not present

## 2018-05-12 DIAGNOSIS — J90 Pleural effusion, not elsewhere classified: Secondary | ICD-10-CM | POA: Diagnosis not present

## 2018-05-12 DIAGNOSIS — Z942 Lung transplant status: Secondary | ICD-10-CM | POA: Diagnosis not present

## 2018-05-13 DIAGNOSIS — J90 Pleural effusion, not elsewhere classified: Secondary | ICD-10-CM | POA: Diagnosis not present

## 2018-05-13 DIAGNOSIS — Z942 Lung transplant status: Secondary | ICD-10-CM | POA: Diagnosis not present

## 2018-05-13 DIAGNOSIS — E0801 Diabetes mellitus due to underlying condition with hyperosmolarity with coma: Secondary | ICD-10-CM | POA: Diagnosis not present

## 2018-05-14 DIAGNOSIS — N179 Acute kidney failure, unspecified: Secondary | ICD-10-CM | POA: Diagnosis not present

## 2018-05-14 DIAGNOSIS — R0902 Hypoxemia: Secondary | ICD-10-CM | POA: Diagnosis not present

## 2018-05-14 DIAGNOSIS — T86818 Other complications of lung transplant: Secondary | ICD-10-CM | POA: Diagnosis not present

## 2018-05-14 DIAGNOSIS — R918 Other nonspecific abnormal finding of lung field: Secondary | ICD-10-CM | POA: Diagnosis not present

## 2018-05-14 DIAGNOSIS — M7989 Other specified soft tissue disorders: Secondary | ICD-10-CM | POA: Diagnosis not present

## 2018-05-14 DIAGNOSIS — Z4824 Encounter for aftercare following lung transplant: Secondary | ICD-10-CM | POA: Diagnosis not present

## 2018-05-14 DIAGNOSIS — D899 Disorder involving the immune mechanism, unspecified: Secondary | ICD-10-CM | POA: Diagnosis not present

## 2018-05-14 DIAGNOSIS — D8989 Other specified disorders involving the immune mechanism, not elsewhere classified: Secondary | ICD-10-CM | POA: Diagnosis not present

## 2018-05-14 DIAGNOSIS — Z942 Lung transplant status: Secondary | ICD-10-CM | POA: Diagnosis not present

## 2018-05-14 DIAGNOSIS — R0602 Shortness of breath: Secondary | ICD-10-CM | POA: Diagnosis not present

## 2018-05-14 DIAGNOSIS — Z9981 Dependence on supplemental oxygen: Secondary | ICD-10-CM | POA: Diagnosis not present

## 2018-05-15 DIAGNOSIS — Z942 Lung transplant status: Secondary | ICD-10-CM | POA: Diagnosis not present

## 2018-05-15 DIAGNOSIS — R0902 Hypoxemia: Secondary | ICD-10-CM | POA: Diagnosis not present

## 2018-05-15 DIAGNOSIS — D899 Disorder involving the immune mechanism, unspecified: Secondary | ICD-10-CM | POA: Diagnosis not present

## 2018-05-15 DIAGNOSIS — E0801 Diabetes mellitus due to underlying condition with hyperosmolarity with coma: Secondary | ICD-10-CM | POA: Diagnosis not present

## 2018-05-15 DIAGNOSIS — R0602 Shortness of breath: Secondary | ICD-10-CM | POA: Diagnosis not present

## 2018-05-15 DIAGNOSIS — N179 Acute kidney failure, unspecified: Secondary | ICD-10-CM | POA: Diagnosis not present

## 2018-05-16 DIAGNOSIS — Z942 Lung transplant status: Secondary | ICD-10-CM | POA: Diagnosis not present

## 2018-05-16 DIAGNOSIS — D899 Disorder involving the immune mechanism, unspecified: Secondary | ICD-10-CM | POA: Diagnosis not present

## 2018-05-16 DIAGNOSIS — J90 Pleural effusion, not elsewhere classified: Secondary | ICD-10-CM | POA: Diagnosis not present

## 2018-05-16 DIAGNOSIS — R918 Other nonspecific abnormal finding of lung field: Secondary | ICD-10-CM | POA: Diagnosis not present

## 2018-05-16 DIAGNOSIS — N179 Acute kidney failure, unspecified: Secondary | ICD-10-CM | POA: Diagnosis not present

## 2018-05-16 DIAGNOSIS — R0902 Hypoxemia: Secondary | ICD-10-CM | POA: Diagnosis not present

## 2018-05-16 DIAGNOSIS — R0602 Shortness of breath: Secondary | ICD-10-CM | POA: Diagnosis not present

## 2018-05-16 DIAGNOSIS — R140 Abdominal distension (gaseous): Secondary | ICD-10-CM | POA: Diagnosis not present

## 2018-05-16 DIAGNOSIS — E0801 Diabetes mellitus due to underlying condition with hyperosmolarity with coma: Secondary | ICD-10-CM | POA: Diagnosis not present

## 2018-05-16 DIAGNOSIS — R112 Nausea with vomiting, unspecified: Secondary | ICD-10-CM | POA: Diagnosis not present

## 2018-05-17 DIAGNOSIS — Z942 Lung transplant status: Secondary | ICD-10-CM | POA: Diagnosis not present

## 2018-05-17 DIAGNOSIS — N179 Acute kidney failure, unspecified: Secondary | ICD-10-CM | POA: Diagnosis not present

## 2018-05-17 DIAGNOSIS — R0902 Hypoxemia: Secondary | ICD-10-CM | POA: Diagnosis not present

## 2018-05-17 DIAGNOSIS — R0602 Shortness of breath: Secondary | ICD-10-CM | POA: Diagnosis not present

## 2018-05-17 DIAGNOSIS — D899 Disorder involving the immune mechanism, unspecified: Secondary | ICD-10-CM | POA: Diagnosis not present

## 2018-05-18 DIAGNOSIS — R0902 Hypoxemia: Secondary | ICD-10-CM | POA: Diagnosis not present

## 2018-05-18 DIAGNOSIS — Z942 Lung transplant status: Secondary | ICD-10-CM | POA: Diagnosis not present

## 2018-05-18 DIAGNOSIS — N179 Acute kidney failure, unspecified: Secondary | ICD-10-CM | POA: Diagnosis not present

## 2018-05-18 DIAGNOSIS — D899 Disorder involving the immune mechanism, unspecified: Secondary | ICD-10-CM | POA: Diagnosis not present

## 2018-05-18 DIAGNOSIS — R0602 Shortness of breath: Secondary | ICD-10-CM | POA: Diagnosis not present

## 2018-05-19 DIAGNOSIS — Z942 Lung transplant status: Secondary | ICD-10-CM | POA: Diagnosis not present

## 2018-05-19 DIAGNOSIS — E0801 Diabetes mellitus due to underlying condition with hyperosmolarity with coma: Secondary | ICD-10-CM | POA: Diagnosis not present

## 2018-05-19 DIAGNOSIS — N179 Acute kidney failure, unspecified: Secondary | ICD-10-CM | POA: Diagnosis not present

## 2018-05-19 DIAGNOSIS — R0902 Hypoxemia: Secondary | ICD-10-CM | POA: Diagnosis not present

## 2018-05-19 DIAGNOSIS — R0602 Shortness of breath: Secondary | ICD-10-CM | POA: Diagnosis not present

## 2018-05-19 DIAGNOSIS — D899 Disorder involving the immune mechanism, unspecified: Secondary | ICD-10-CM | POA: Diagnosis not present

## 2018-05-28 DIAGNOSIS — Z942 Lung transplant status: Secondary | ICD-10-CM | POA: Diagnosis not present

## 2018-05-28 DIAGNOSIS — T86819 Unspecified complication of lung transplant: Secondary | ICD-10-CM | POA: Diagnosis not present

## 2018-06-04 DIAGNOSIS — E669 Obesity, unspecified: Secondary | ICD-10-CM | POA: Diagnosis not present

## 2018-06-04 DIAGNOSIS — J90 Pleural effusion, not elsewhere classified: Secondary | ICD-10-CM | POA: Diagnosis not present

## 2018-06-04 DIAGNOSIS — R918 Other nonspecific abnormal finding of lung field: Secondary | ICD-10-CM | POA: Diagnosis not present

## 2018-06-04 DIAGNOSIS — F319 Bipolar disorder, unspecified: Secondary | ICD-10-CM | POA: Diagnosis not present

## 2018-06-04 DIAGNOSIS — T86819 Unspecified complication of lung transplant: Secondary | ICD-10-CM | POA: Diagnosis not present

## 2018-06-04 DIAGNOSIS — Z942 Lung transplant status: Secondary | ICD-10-CM | POA: Diagnosis not present

## 2018-06-04 DIAGNOSIS — I10 Essential (primary) hypertension: Secondary | ICD-10-CM | POA: Diagnosis not present

## 2018-06-12 ENCOUNTER — Telehealth: Payer: Self-pay | Admitting: Pulmonary Disease

## 2018-06-12 NOTE — Telephone Encounter (Signed)
Called and spoke with pt to see if he was needing last OV notes that he had with [facility] and pt stated he actually needed the OV notes from the very first OV he had with [facility] 02/17/14. Pt stated this was for social security.  Asked pt if he wanted to come by office to pick up notes or if he wanted it to be placed in the mail and pt stated before we did anything, he was going to double check with his wife to make sure he had told us the correct info that it was the OV notes from the very first visit or if it was something else he needed.  Pt would call us back as soon as he had the correct info for [place]. Will leave encounter open and await a return call from pt.

## 2018-06-16 NOTE — Telephone Encounter (Signed)
Called patient unable to reach LMTCB 

## 2018-06-17 NOTE — Telephone Encounter (Signed)
ATC Patient.  Left message to call back. 

## 2018-06-18 NOTE — Telephone Encounter (Signed)
Left message for patient to call back  

## 2018-06-19 NOTE — Telephone Encounter (Signed)
ATC Patient.  Left message to call back. 

## 2018-06-20 ENCOUNTER — Telehealth: Payer: Self-pay | Admitting: Pulmonary Disease

## 2018-06-20 NOTE — Telephone Encounter (Signed)
Attempted to call pt but unable to reach. Left message for pt to return call. Due to multiple attempts trying to call pt with no success, per triage protocol encounter will be closed.

## 2018-06-20 NOTE — Telephone Encounter (Signed)
Spoke with pt.  Pt neds OV notes prior to 06/29/16.  Printed out the previous years OV notes and put at front desk for pick up.  Nothing further is needed.

## 2018-07-02 DIAGNOSIS — R0602 Shortness of breath: Secondary | ICD-10-CM | POA: Diagnosis not present

## 2018-07-02 DIAGNOSIS — E669 Obesity, unspecified: Secondary | ICD-10-CM | POA: Diagnosis not present

## 2018-07-02 DIAGNOSIS — F319 Bipolar disorder, unspecified: Secondary | ICD-10-CM | POA: Diagnosis not present

## 2018-07-02 DIAGNOSIS — M25552 Pain in left hip: Secondary | ICD-10-CM | POA: Diagnosis not present

## 2018-07-02 DIAGNOSIS — Z942 Lung transplant status: Secondary | ICD-10-CM | POA: Diagnosis not present

## 2018-07-02 DIAGNOSIS — T86819 Unspecified complication of lung transplant: Secondary | ICD-10-CM | POA: Diagnosis not present

## 2018-07-02 DIAGNOSIS — Z4824 Encounter for aftercare following lung transplant: Secondary | ICD-10-CM | POA: Diagnosis not present

## 2018-07-02 DIAGNOSIS — I10 Essential (primary) hypertension: Secondary | ICD-10-CM | POA: Diagnosis not present

## 2018-07-02 DIAGNOSIS — D72819 Decreased white blood cell count, unspecified: Secondary | ICD-10-CM | POA: Diagnosis not present

## 2018-07-02 DIAGNOSIS — Z79899 Other long term (current) drug therapy: Secondary | ICD-10-CM | POA: Diagnosis not present

## 2018-07-02 DIAGNOSIS — Z915 Personal history of self-harm: Secondary | ICD-10-CM | POA: Diagnosis not present

## 2018-07-02 DIAGNOSIS — Z87891 Personal history of nicotine dependence: Secondary | ICD-10-CM | POA: Diagnosis not present

## 2018-07-02 DIAGNOSIS — R918 Other nonspecific abnormal finding of lung field: Secondary | ICD-10-CM | POA: Diagnosis not present

## 2018-07-04 DIAGNOSIS — M79671 Pain in right foot: Secondary | ICD-10-CM | POA: Diagnosis not present

## 2018-07-07 DIAGNOSIS — M7062 Trochanteric bursitis, left hip: Secondary | ICD-10-CM | POA: Diagnosis not present

## 2018-07-07 DIAGNOSIS — M25552 Pain in left hip: Secondary | ICD-10-CM | POA: Diagnosis not present

## 2018-07-07 DIAGNOSIS — M87052 Idiopathic aseptic necrosis of left femur: Secondary | ICD-10-CM | POA: Diagnosis not present

## 2018-07-08 DIAGNOSIS — T86818 Other complications of lung transplant: Secondary | ICD-10-CM | POA: Diagnosis not present

## 2018-07-08 DIAGNOSIS — J90 Pleural effusion, not elsewhere classified: Secondary | ICD-10-CM | POA: Diagnosis not present

## 2018-07-08 DIAGNOSIS — J449 Chronic obstructive pulmonary disease, unspecified: Secondary | ICD-10-CM | POA: Diagnosis not present

## 2018-07-08 DIAGNOSIS — J811 Chronic pulmonary edema: Secondary | ICD-10-CM | POA: Diagnosis not present

## 2018-07-08 DIAGNOSIS — Z942 Lung transplant status: Secondary | ICD-10-CM | POA: Diagnosis not present

## 2018-07-16 DIAGNOSIS — M87052 Idiopathic aseptic necrosis of left femur: Secondary | ICD-10-CM | POA: Diagnosis not present

## 2018-07-16 DIAGNOSIS — M1612 Unilateral primary osteoarthritis, left hip: Secondary | ICD-10-CM | POA: Diagnosis not present

## 2018-07-16 DIAGNOSIS — M7062 Trochanteric bursitis, left hip: Secondary | ICD-10-CM | POA: Diagnosis not present

## 2018-07-16 DIAGNOSIS — M25552 Pain in left hip: Secondary | ICD-10-CM | POA: Diagnosis not present

## 2018-08-18 DIAGNOSIS — Z1159 Encounter for screening for other viral diseases: Secondary | ICD-10-CM | POA: Diagnosis not present

## 2018-08-18 DIAGNOSIS — Z01818 Encounter for other preprocedural examination: Secondary | ICD-10-CM | POA: Diagnosis not present

## 2018-08-18 DIAGNOSIS — Z942 Lung transplant status: Secondary | ICD-10-CM | POA: Diagnosis not present

## 2018-08-20 DIAGNOSIS — Z79899 Other long term (current) drug therapy: Secondary | ICD-10-CM | POA: Diagnosis not present

## 2018-08-20 DIAGNOSIS — D899 Disorder involving the immune mechanism, unspecified: Secondary | ICD-10-CM | POA: Diagnosis not present

## 2018-08-20 DIAGNOSIS — Z4824 Encounter for aftercare following lung transplant: Secondary | ICD-10-CM | POA: Diagnosis not present

## 2018-08-20 DIAGNOSIS — Z942 Lung transplant status: Secondary | ICD-10-CM | POA: Diagnosis not present

## 2018-08-20 DIAGNOSIS — R918 Other nonspecific abnormal finding of lung field: Secondary | ICD-10-CM | POA: Diagnosis not present

## 2018-08-20 DIAGNOSIS — F319 Bipolar disorder, unspecified: Secondary | ICD-10-CM | POA: Diagnosis not present

## 2018-08-20 DIAGNOSIS — Z7952 Long term (current) use of systemic steroids: Secondary | ICD-10-CM | POA: Diagnosis not present

## 2018-08-20 DIAGNOSIS — T86810 Lung transplant rejection: Secondary | ICD-10-CM | POA: Diagnosis not present

## 2018-08-20 DIAGNOSIS — T86819 Unspecified complication of lung transplant: Secondary | ICD-10-CM | POA: Diagnosis not present

## 2018-08-22 DIAGNOSIS — Z942 Lung transplant status: Secondary | ICD-10-CM | POA: Diagnosis not present

## 2018-08-28 DIAGNOSIS — Q678 Other congenital deformities of chest: Secondary | ICD-10-CM | POA: Diagnosis not present

## 2018-08-28 DIAGNOSIS — Z942 Lung transplant status: Secondary | ICD-10-CM | POA: Diagnosis not present

## 2018-08-28 DIAGNOSIS — R0789 Other chest pain: Secondary | ICD-10-CM | POA: Diagnosis not present

## 2018-09-04 DIAGNOSIS — M898X8 Other specified disorders of bone, other site: Secondary | ICD-10-CM | POA: Diagnosis not present

## 2018-09-04 DIAGNOSIS — M1612 Unilateral primary osteoarthritis, left hip: Secondary | ICD-10-CM | POA: Diagnosis not present

## 2018-09-04 DIAGNOSIS — Z942 Lung transplant status: Secondary | ICD-10-CM | POA: Diagnosis not present

## 2018-09-09 DIAGNOSIS — F319 Bipolar disorder, unspecified: Secondary | ICD-10-CM | POA: Diagnosis not present

## 2018-09-09 DIAGNOSIS — Z942 Lung transplant status: Secondary | ICD-10-CM | POA: Diagnosis not present

## 2018-09-09 DIAGNOSIS — Z5181 Encounter for therapeutic drug level monitoring: Secondary | ICD-10-CM | POA: Diagnosis not present

## 2018-10-13 IMAGING — CR DG CHEST 2V
2 series · 2 of 2 positions shown · non-contrast
Comparison: 05/30/2017

CLINICAL DATA: Chest pain and indigestion for 3 days, post double
lung transplant in [place], former smoker, COPD

EXAM:
CHEST - 2 VIEW

[chest pa]
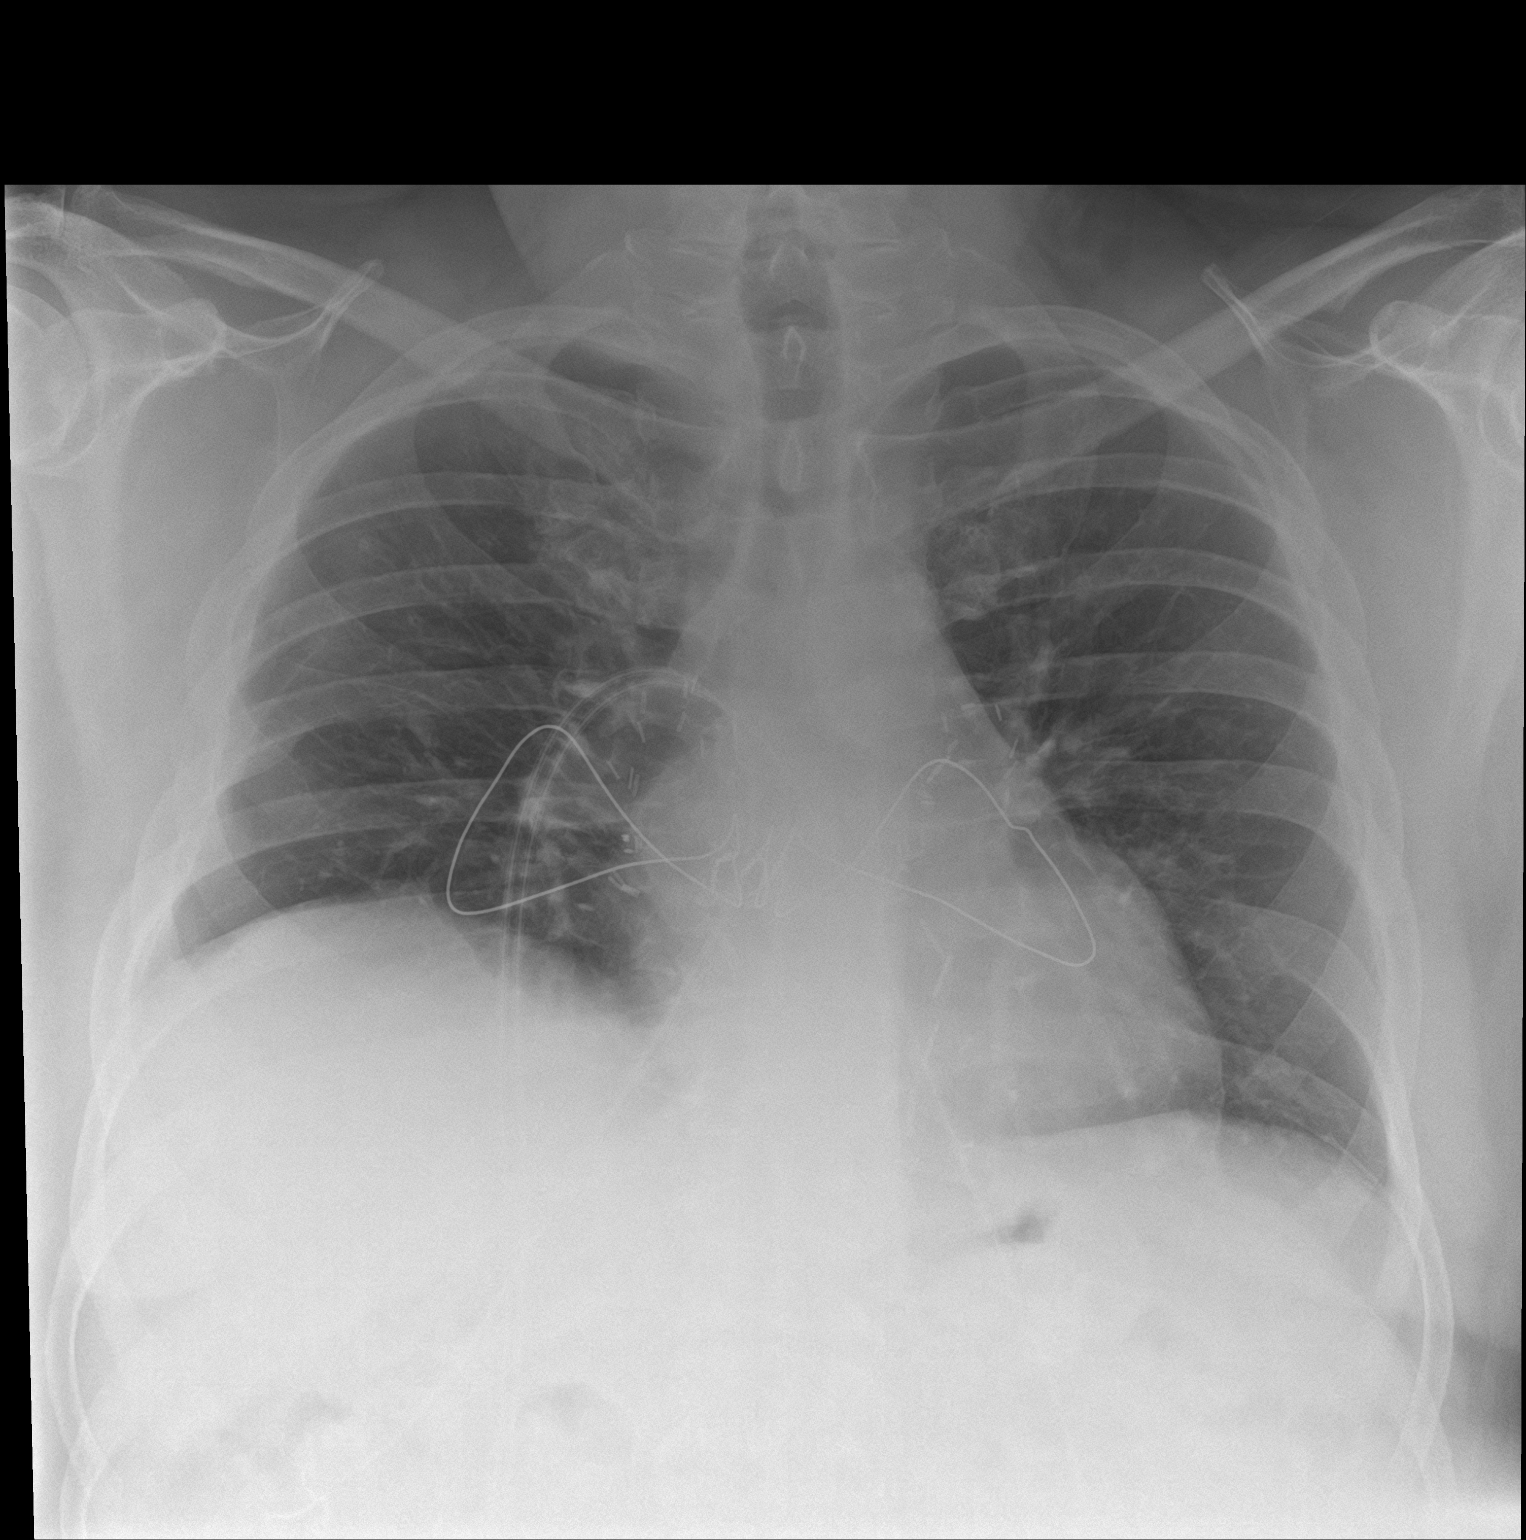

[chest lat]
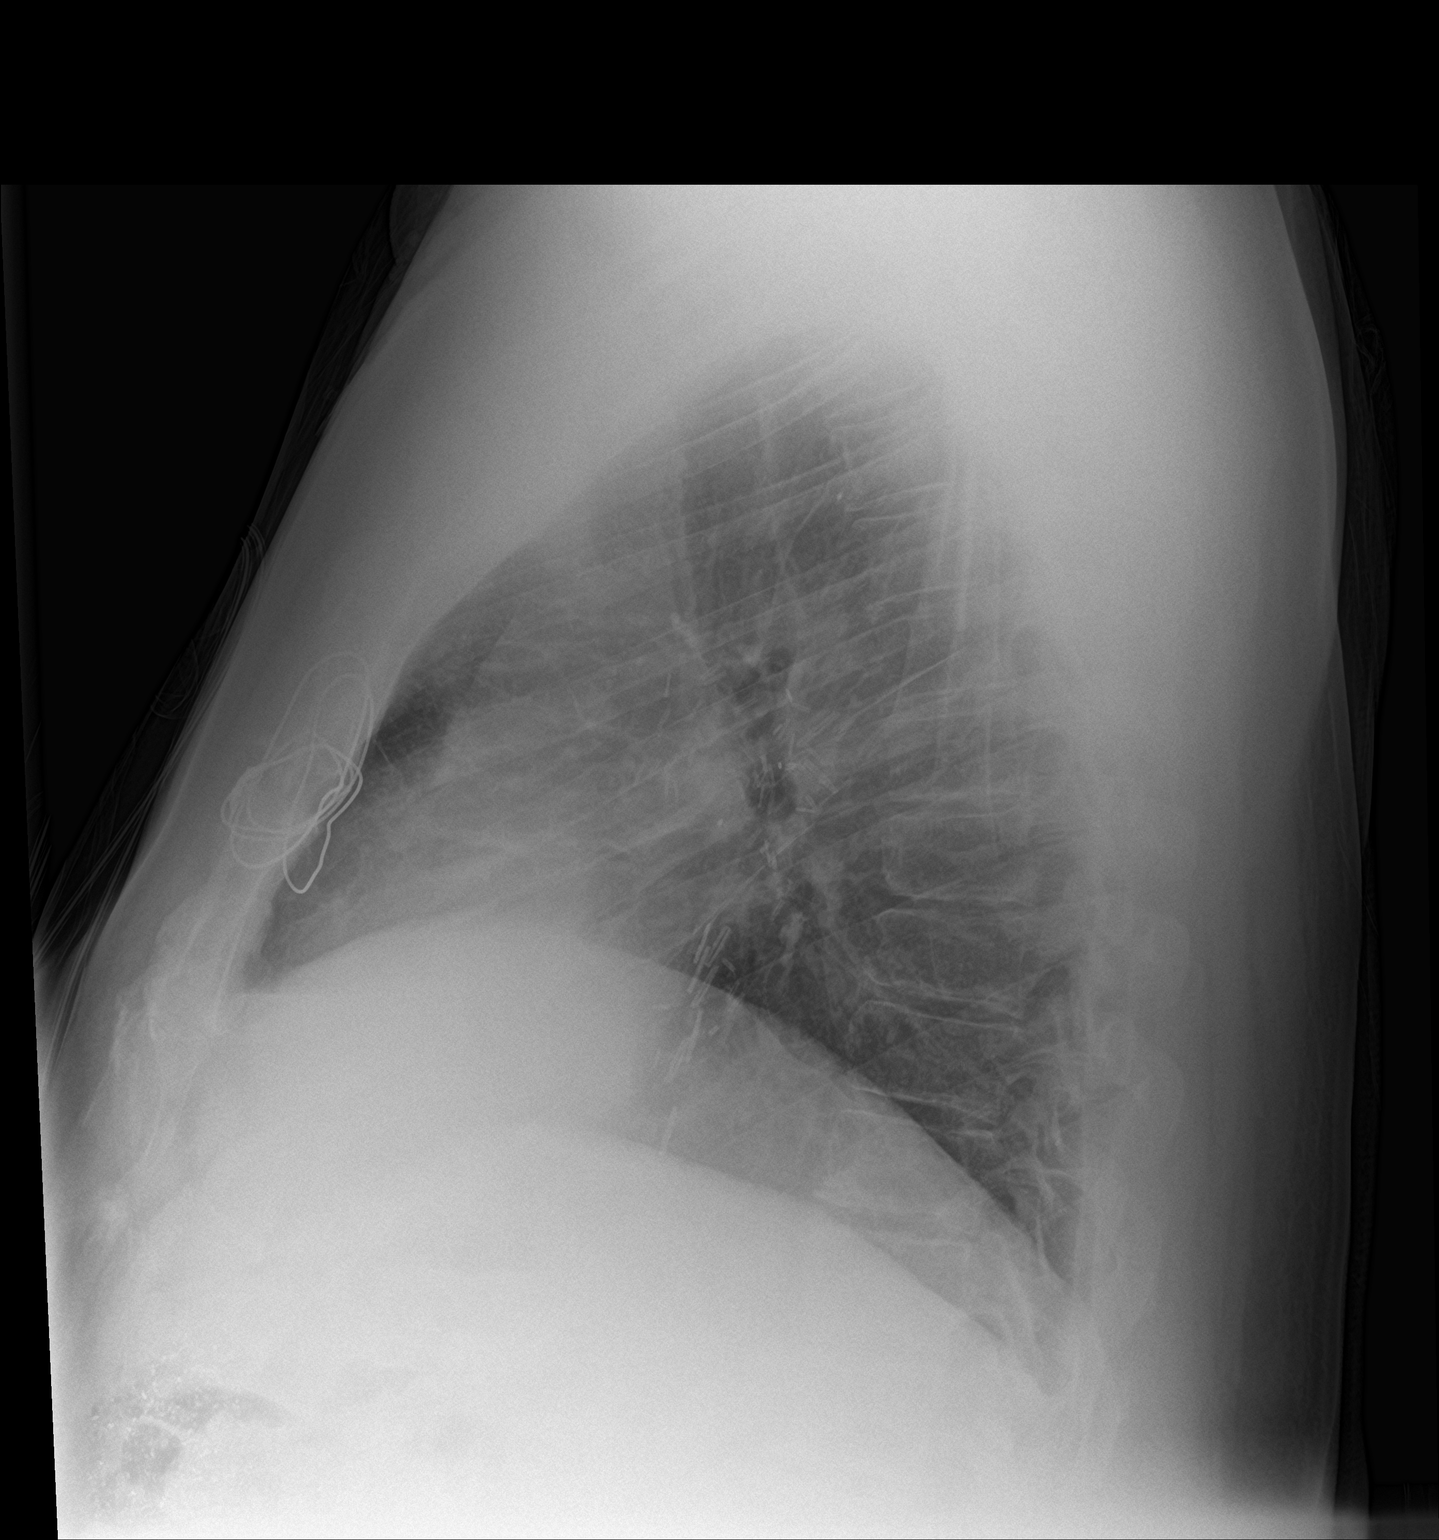

[2 of 2 positions shown; findings below may reference images not displayed]

FINDINGS: Normal cardiac and mediastinal silhouettes.

Vascular markings normal.

Severe chronic interstitial lung disease changes seen on the
previous exam are no longer identified due to interval BILATERAL
lung transplantation.

No acute infiltrate, pleural effusion or pneumothorax.

Bones unremarkable.
IMPRESSION: Interval BILATERAL lung transplant.

No acute abnormalities.

## 2018-10-27 DIAGNOSIS — Z5181 Encounter for therapeutic drug level monitoring: Secondary | ICD-10-CM | POA: Diagnosis not present

## 2018-10-27 DIAGNOSIS — Z942 Lung transplant status: Secondary | ICD-10-CM | POA: Diagnosis not present

## 2018-10-28 DIAGNOSIS — E119 Type 2 diabetes mellitus without complications: Secondary | ICD-10-CM | POA: Diagnosis not present

## 2018-11-07 DIAGNOSIS — Z942 Lung transplant status: Secondary | ICD-10-CM | POA: Diagnosis not present

## 2018-11-13 DIAGNOSIS — Z942 Lung transplant status: Secondary | ICD-10-CM | POA: Diagnosis not present

## 2018-11-19 DIAGNOSIS — Z942 Lung transplant status: Secondary | ICD-10-CM | POA: Diagnosis not present

## 2018-11-19 DIAGNOSIS — Z5181 Encounter for therapeutic drug level monitoring: Secondary | ICD-10-CM | POA: Diagnosis not present

## 2018-11-19 DIAGNOSIS — D702 Other drug-induced agranulocytosis: Secondary | ICD-10-CM | POA: Diagnosis not present

## 2018-11-21 DIAGNOSIS — F319 Bipolar disorder, unspecified: Secondary | ICD-10-CM | POA: Diagnosis not present

## 2018-11-21 DIAGNOSIS — E669 Obesity, unspecified: Secondary | ICD-10-CM | POA: Diagnosis not present

## 2018-11-21 DIAGNOSIS — R05 Cough: Secondary | ICD-10-CM | POA: Diagnosis not present

## 2018-11-21 DIAGNOSIS — D702 Other drug-induced agranulocytosis: Secondary | ICD-10-CM | POA: Diagnosis not present

## 2018-11-21 DIAGNOSIS — N183 Chronic kidney disease, stage 3 (moderate): Secondary | ICD-10-CM | POA: Diagnosis not present

## 2018-11-21 DIAGNOSIS — Z79899 Other long term (current) drug therapy: Secondary | ICD-10-CM | POA: Diagnosis not present

## 2018-11-21 DIAGNOSIS — I129 Hypertensive chronic kidney disease with stage 1 through stage 4 chronic kidney disease, or unspecified chronic kidney disease: Secondary | ICD-10-CM | POA: Diagnosis not present

## 2018-11-21 DIAGNOSIS — T86819 Unspecified complication of lung transplant: Secondary | ICD-10-CM | POA: Diagnosis not present

## 2018-11-21 DIAGNOSIS — Z942 Lung transplant status: Secondary | ICD-10-CM | POA: Diagnosis not present

## 2018-11-21 DIAGNOSIS — D899 Disorder involving the immune mechanism, unspecified: Secondary | ICD-10-CM | POA: Diagnosis not present

## 2018-11-21 DIAGNOSIS — Q678 Other congenital deformities of chest: Secondary | ICD-10-CM | POA: Diagnosis not present

## 2018-11-21 DIAGNOSIS — M25552 Pain in left hip: Secondary | ICD-10-CM | POA: Diagnosis not present

## 2018-11-21 DIAGNOSIS — T84218S Breakdown (mechanical) of internal fixation device of other bones, sequela: Secondary | ICD-10-CM | POA: Diagnosis not present

## 2018-12-02 DIAGNOSIS — M25552 Pain in left hip: Secondary | ICD-10-CM | POA: Diagnosis not present

## 2018-12-02 DIAGNOSIS — M87052 Idiopathic aseptic necrosis of left femur: Secondary | ICD-10-CM | POA: Diagnosis not present

## 2018-12-02 DIAGNOSIS — M1612 Unilateral primary osteoarthritis, left hip: Secondary | ICD-10-CM | POA: Diagnosis not present

## 2018-12-02 DIAGNOSIS — M7062 Trochanteric bursitis, left hip: Secondary | ICD-10-CM | POA: Diagnosis not present

## 2018-12-27 DIAGNOSIS — Z20828 Contact with and (suspected) exposure to other viral communicable diseases: Secondary | ICD-10-CM | POA: Diagnosis not present

## 2018-12-27 DIAGNOSIS — Z942 Lung transplant status: Secondary | ICD-10-CM | POA: Diagnosis not present

## 2019-03-17 DIAGNOSIS — Z4824 Encounter for aftercare following lung transplant: Secondary | ICD-10-CM | POA: Diagnosis not present

## 2019-03-17 DIAGNOSIS — Z942 Lung transplant status: Secondary | ICD-10-CM | POA: Diagnosis not present

## 2019-03-17 DIAGNOSIS — J90 Pleural effusion, not elsewhere classified: Secondary | ICD-10-CM | POA: Diagnosis not present

## 2019-03-17 DIAGNOSIS — R933 Abnormal findings on diagnostic imaging of other parts of digestive tract: Secondary | ICD-10-CM | POA: Diagnosis not present

## 2019-03-17 DIAGNOSIS — T84218S Breakdown (mechanical) of internal fixation device of other bones, sequela: Secondary | ICD-10-CM | POA: Diagnosis not present

## 2019-03-17 DIAGNOSIS — D849 Immunodeficiency, unspecified: Secondary | ICD-10-CM | POA: Diagnosis not present

## 2019-03-17 DIAGNOSIS — A419 Sepsis, unspecified organism: Secondary | ICD-10-CM | POA: Diagnosis not present

## 2019-03-17 DIAGNOSIS — R140 Abdominal distension (gaseous): Secondary | ICD-10-CM | POA: Diagnosis not present

## 2019-03-17 DIAGNOSIS — K5730 Diverticulosis of large intestine without perforation or abscess without bleeding: Secondary | ICD-10-CM | POA: Diagnosis not present

## 2019-03-17 DIAGNOSIS — R079 Chest pain, unspecified: Secondary | ICD-10-CM | POA: Diagnosis not present

## 2019-03-17 DIAGNOSIS — Z79899 Other long term (current) drug therapy: Secondary | ICD-10-CM | POA: Diagnosis not present

## 2019-03-17 DIAGNOSIS — R509 Fever, unspecified: Secondary | ICD-10-CM | POA: Diagnosis not present

## 2019-03-23 ENCOUNTER — Telehealth: Payer: Self-pay | Admitting: Pulmonary Disease

## 2019-03-23 NOTE — Telephone Encounter (Signed)
I called the pharmacist at [place] and she is requesting a refill for Nexium. I advised her that the pt has not been seen in over  Year and it looks like [facility] has taken over his care. She stated she would note the account and I also advised her to to let pt know he can reestablish care here if he would like, he would just have to make an appt. Nothing further is needed at this time.

## 2019-06-25 ENCOUNTER — Ambulatory Visit: Payer: Self-pay | Attending: Internal Medicine

## 2019-06-25 DIAGNOSIS — Z23 Encounter for immunization: Secondary | ICD-10-CM

## 2019-06-25 NOTE — Progress Notes (Signed)
   Covid-19 Vaccination Clinic  Name:  Miguel Christiana    MRN: [id number] DOB: [birth year]  06/25/2019  Mr. Hewins was observed post Covid-19 immunization for 15 minutes without incident. He was provided with Vaccine Information Sheet and instruction to access the V-Safe system.   Mr. Haberer was instructed to call 911 with any severe reactions post vaccine: Marland Kitchen Difficulty breathing  . Swelling of face and throat  . A fast heartbeat  . A bad rash all over body  . Dizziness and weakness   Immunizations Administered    Name Date Dose VIS Date Route   Pfizer COVID-19 Vaccine 06/25/2019  8:22 AM 0.3 mL 03/13/2019 Intramuscular   Manufacturer: [employer], [employer]   Lot: [id number]   NDC: [address]-9

## 2019-07-21 ENCOUNTER — Ambulatory Visit: Payer: BLUE CROSS/BLUE SHIELD | Attending: Internal Medicine

## 2019-07-21 DIAGNOSIS — Z23 Encounter for immunization: Secondary | ICD-10-CM

## 2019-07-21 NOTE — Progress Notes (Signed)
   Covid-19 Vaccination Clinic  Name:  Kevin Boyd    MRN: [id number] DOB: May 28, 1965  07/21/2019  Mr. Kevin Boyd was observed post Covid-19 immunization for 15 minutes without incident. He was provided with Vaccine Information Sheet and instruction to access the V-Safe system.   Mr. Kevin Boyd was instructed to call 911 with any severe reactions post vaccine: Marland Kitchen Difficulty breathing  . Swelling of face and throat  . A fast heartbeat  . A bad rash all over body  . Dizziness and weakness   Immunizations Administered    Name Date Dose VIS Date Route   Pfizer COVID-19 Vaccine 07/21/2019  8:23 AM 0.3 mL 05/27/2018 Intramuscular   Manufacturer: [employer], [employer]   Lot: [id number]   NDC: [address]-4

## 2019-07-24 ENCOUNTER — Encounter: Payer: Self-pay | Admitting: Emergency Medicine

## 2019-07-24 ENCOUNTER — Emergency Department
Admission: EM | Admit: 2019-07-24 | Discharge: 2019-07-25 | Disposition: A | Discharge status: Home / Self Care | Payer: BC Managed Care – PPO | Attending: Emergency Medicine | Admitting: Emergency Medicine

## 2019-07-24 ENCOUNTER — Other Ambulatory Visit: Payer: Self-pay

## 2019-07-24 DIAGNOSIS — Z79899 Other long term (current) drug therapy: Secondary | ICD-10-CM | POA: Insufficient documentation

## 2019-07-24 DIAGNOSIS — Z7982 Long term (current) use of aspirin: Secondary | ICD-10-CM | POA: Diagnosis not present

## 2019-07-24 DIAGNOSIS — R1084 Generalized abdominal pain: Secondary | ICD-10-CM

## 2019-07-24 DIAGNOSIS — Z87891 Personal history of nicotine dependence: Secondary | ICD-10-CM | POA: Diagnosis not present

## 2019-07-24 DIAGNOSIS — K439 Ventral hernia without obstruction or gangrene: Secondary | ICD-10-CM | POA: Diagnosis not present

## 2019-07-24 DIAGNOSIS — R109 Unspecified abdominal pain: Secondary | ICD-10-CM | POA: Diagnosis present

## 2019-07-24 DIAGNOSIS — J45909 Unspecified asthma, uncomplicated: Secondary | ICD-10-CM | POA: Diagnosis not present

## 2019-07-24 DIAGNOSIS — Z942 Lung transplant status: Secondary | ICD-10-CM | POA: Diagnosis not present

## 2019-07-24 LAB — CBC
HCT: 27.9 % — ABNORMAL LOW (ref 39.0–52.0)
Hemoglobin: 9.5 g/dL — ABNORMAL LOW (ref 13.0–17.0)
MCH: 33.2 pg (ref 26.0–34.0)
MCHC: 34.1 g/dL (ref 30.0–36.0)
MCV: 97.6 fL (ref 80.0–100.0)
Platelets: 184 10*3/uL (ref 150–400)
RBC: 2.86 MIL/uL — ABNORMAL LOW (ref 4.22–5.81)
RDW: 14.2 % (ref 11.5–15.5)
WBC: 2.6 10*3/uL — ABNORMAL LOW (ref 4.0–10.5)
nRBC: 0 % (ref 0.0–0.2)

## 2019-07-24 LAB — COMPREHENSIVE METABOLIC PANEL
ALT: 14 U/L (ref 0–44)
AST: 18 U/L (ref 15–41)
Albumin: 4.2 g/dL (ref 3.5–5.0)
Alkaline Phosphatase: 65 U/L (ref 38–126)
Anion gap: 9 (ref 5–15)
BUN: 30 mg/dL — ABNORMAL HIGH (ref 6–20)
CO2: 21 mmol/L — ABNORMAL LOW (ref 22–32)
Calcium: 9.2 mg/dL (ref 8.9–10.3)
Chloride: 110 mmol/L (ref 98–111)
Creatinine, Ser: 1.5 mg/dL — ABNORMAL HIGH (ref 0.61–1.24)
GFR calc Af Amer: >60 mL/min (ref >60)
GFR calc non Af Amer: 52 mL/min — ABNORMAL LOW (ref >60)
Glucose, Bld: 107 mg/dL — ABNORMAL HIGH (ref 70–99)
Potassium: 4.5 mmol/L (ref 3.5–5.1)
Sodium: 140 mmol/L (ref 135–145)
Total Bilirubin: 0.8 mg/dL (ref 0.3–1.2)
Total Protein: 7 g/dL (ref 6.5–8.1)

## 2019-07-24 LAB — TROPONIN I (HIGH SENSITIVITY): Troponin I (High Sensitivity): 4 ng/L (ref <18)

## 2019-07-24 LAB — LIPASE, BLOOD: Lipase: 22 U/L (ref 11–51)

## 2019-07-24 MED ORDER — IOHEXOL 9 MG/ML PO SOLN
500.0000 mL | Freq: Two times a day (BID) | ORAL | Status: DC | PRN
  Administered 2019-07-24: 500 mL via ORAL

## 2019-07-24 MED ORDER — ONDANSETRON HCL 4 MG/2ML IJ SOLN
4.0000 mg | Freq: Once | INTRAMUSCULAR | Status: AC
  Administered 2019-07-24: 4 mg via INTRAVENOUS
  Filled 2019-07-24: qty 2

## 2019-07-24 MED ORDER — HYDROMORPHONE HCL 1 MG/ML IJ SOLN
1.0000 mg | Freq: Once | INTRAMUSCULAR | Status: DC
  Filled 2019-07-24: qty 1

## 2019-07-24 MED ORDER — SODIUM CHLORIDE 0.9 % IV BOLUS
500.0000 mL | Freq: Once | INTRAVENOUS | Status: AC
  Administered 2019-07-24: 500 mL via INTRAVENOUS

## 2019-07-24 MED ORDER — MORPHINE SULFATE (PF) 4 MG/ML IV SOLN
4.0000 mg | Freq: Once | INTRAVENOUS | Status: AC
  Administered 2019-07-25: 4 mg via INTRAVENOUS
  Filled 2019-07-24: qty 1

## 2019-07-24 NOTE — ED Provider Notes (Signed)
[facility] Emergency Department Provider Note   ____________________________________________   First MD Initiated Contact with Patient 07/24/19 2308     (approximate)  I have reviewed the triage vital signs and the nursing notes.   HISTORY  Chief Complaint Abdominal Pain    HPI Kevin Boyd is a 54 y.o. male 04/22/2019 ventral hernia repair and omental flap with ongoing abdominal pain; multiple readmissions in February and March for perioperative pain control.  History significant for COPD status post BOLT (07/2017), followed at [facility].  Reports a 1 week history of worsening abdominal pain associated with occasional nausea and vomiting.  States he notes a lump that keeps popping up on his abdomen which she is able to push down.  Reports no significant pain relief from his prescribed morphine.  He did telephone his transplant coordinator who recommended he come to the ED for evaluation.  Denies associated fever, cough, chest pain, shortness of breath, dysuria or diarrhea.      Past Medical History:  Diagnosis Date  . Asthma   . Bipolar 1 disorder (HCC)   . Borderline diabetes mellitus   . C. difficile diarrhea   . COPD (chronic obstructive pulmonary disease) (HCC)   . GERD (gastroesophageal reflux disease)   . Interstitial lung disease (HCC)   . Scoliosis   . Seasonal allergies   . Sleep apnea     Patient Active Problem List   Diagnosis Date Noted  . OSA (obstructive sleep apnea) 10/12/2016  . Hypertriglyceridemia 02/15/2016  . Prediabetes 02/15/2016  . Essential hypertension 02/15/2016  . Left hemiparesis (HCC)   . Hemiplegic migraine 02/13/2016  . Cough 04/25/2015  . CAP (community acquired pneumonia) 03/17/2015  . Migraine 11/05/2014  . Hyperlipidemia 11/04/2014  . Migraine headache 11/03/2014  . Borderline diabetes mellitus 11/03/2014  . Bipolar 1 disorder (HCC) 11/03/2014  . Fatigue 10/05/2014  . GERD (gastroesophageal reflux disease)  06/22/2014  . Chest pain 04/06/2014  . Shortness of breath 03/04/2014  . Solitary pulmonary nodule 03/04/2014  . IPF (idiopathic pulmonary fibrosis) (HCC) 02/17/2014    Past Surgical History:  Procedure Laterality Date  . BACK SURGERY     lower lumbar fusion  . LUNG SURGERY     biopsy  . LUNG TRANSPLANT, DOUBLE    . thumb surgery      Prior to Admission medications   Medication Sig Start Date End Date Taking? Authorizing Provider  albuterol (PROVENTIL HFA;VENTOLIN HFA) 108 (90 Base) MCG/ACT inhaler Inhale 2 puffs into the lungs every 6 (six) hours as needed for wheezing or shortness of breath. 09/18/16   [name], MD  albuterol (PROVENTIL) (2.5 MG/3ML) 0.083% nebulizer solution Take 3 mLs (2.5 mg total) by nebulization 2 (two) times daily. 05/30/17   [name], MD  aspirin EC 81 MG tablet Take 1 tablet (81 mg total) by mouth daily. 11/04/14   Shaune Pollack, MD  atorvastatin (LIPITOR) 40 MG tablet Take 1 tablet (40 mg total) by mouth daily at 6 PM. 11/04/14   Shaune Pollack, MD  Bioflavonoid Products (ESTER C PO) Take 1 tablet by mouth daily.    [provider]  carbamazepine (TEGRETOL XR) 100 MG 12 hr tablet Take 100-200 mg by mouth 2 (two) times daily. 2 tab qam, 2 tabs qhs    [provider]  chlorpheniramine-HYDROcodone (TUSSIONEX PENNKINETIC ER) 10-8 MG/5ML SUER Take 5 mLs by mouth every 12 (twelve) hours as needed for cough. 05/30/17   [name], MD  Cholecalciferol (  VITAMIN D-3) 5000 UNITS TABS Take 1 tablet by mouth daily.    [provider]  docusate sodium (COLACE) 100 MG capsule Take 100 mg by mouth at bedtime.    [provider]  doxycycline (VIBRA-TABS) 100 MG tablet Take 1 tablet (100 mg total) by mouth 2 (two) times daily. 04/01/17   Bevelyn Ngo, NP  esomeprazole (NEXIUM) 40 MG capsule TAKE 1 CAPSULE BY MOUTH 2 TIMES DAILY BEFORE A MEAL 04/24/18   [name], MD  ferrous gluconate (FERGON) 324 MG tablet Take 324  mg by mouth every other day. At night    [provider]  lamoTRIgine (LAMICTAL) 200 MG tablet Take 200 mg by mouth 2 (two) times daily.    [provider]  predniSONE (DELTASONE) 20 MG tablet Take 1 tablet (20 mg total) by mouth daily with breakfast. 05/30/17   [name], MD  Probiotic Product (PROBIOTIC DAILY PO) Take 1 tablet by mouth daily.    [provider]  Respiratory Therapy Supplies (FLUTTER) DEVI Use as directed 03/17/15   [name], MD  sodium chloride HYPERTONIC 3 % nebulizer solution Take by nebulization 2 (two) times daily. 06/04/17   [name], MD    Allergies Other, Aspartame and phenylalanine, Hydromorphone, Prograf [tacrolimus], and Sucralose  Family History  Problem Relation Age of Onset  . Pulmonary fibrosis Mother   . Sjogren's syndrome Mother   . Pulmonary fibrosis Maternal Grandmother     Social History Social History   Tobacco Use  . Smoking status: Former Smoker    Packs/day: 2.00    Years: 30.00    Pack years: 60.00    Types: Cigarettes    Quit date: 12/28/2013    Years since quitting: 5.5  . Smokeless tobacco: Former Neurosurgeon  . Tobacco comment: 2015  Substance Use Topics  . Alcohol use: Not Currently    Alcohol/week: 0.0 standard drinks  . Drug use: Never    Review of Systems  Constitutional: No fever/chills Eyes: No visual changes. ENT: No sore throat. Cardiovascular: Denies chest pain. Respiratory: Denies shortness of breath. Gastrointestinal: Positive for abdominal pain, nausea and vomiting.  No diarrhea.  No constipation. Genitourinary: Negative for dysuria. Musculoskeletal: Negative for back pain. Skin: Negative for rash. Neurological: Negative for headaches, focal weakness or numbness.   ____________________________________________   PHYSICAL EXAM:  VITAL SIGNS: ED Triage Vitals  Enc Vitals Group     BP 07/24/19 1947 (!) 147/88     Pulse Rate 07/24/19 1947 74     Resp 07/24/19  1947 20     Temp 07/24/19 1947 98.2 F (36.8 C)     Temp Source 07/24/19 1947 Oral     SpO2 07/24/19 1947 100 %     Weight 07/24/19 1949 269 lb (122 kg)     Height 07/24/19 1949 6\' 2"  (1.88 m)     Head Circumference --      Peak Flow --      Pain Score 07/24/19 1949 9     Pain Loc --      Pain Edu? --      Excl. in GC? --     Constitutional: Alert and oriented. Well appearing and in no acute distress. Eyes: Conjunctivae are normal. PERRL. EOMI. Head: Atraumatic. Nose: No congestion/rhinnorhea. Mouth/Throat: Mucous membranes are moist.  Oropharynx non-erythematous. Neck: No stridor.   Cardiovascular: Normal rate, regular rhythm. Grossly normal heart sounds.  Good peripheral circulation. Respiratory: Normal respiratory effort.  No  retractions. Lungs CTAB. Gastrointestinal: Soft with mild diffuse tenderness to palpation without rebound or guarding.  No visible or palpable hernia on examination.. No distention. No abdominal bruits. No CVA tenderness. Musculoskeletal: No lower extremity tenderness nor edema.  No joint effusions. Neurologic:  Normal speech and language. No gross focal neurologic deficits are appreciated. No gait instability. Skin:  Skin is warm, dry and intact. No rash noted. Psychiatric: Mood and affect are normal. Speech and behavior are normal.  ____________________________________________   LABS (all labs ordered are listed, but only abnormal results are displayed)  Labs Reviewed  COMPREHENSIVE METABOLIC PANEL - Abnormal; Notable for the following components:      Result Value   CO2 21 (*)    Glucose, Bld 107 (*)    BUN 30 (*)    Creatinine, Ser 1.50 (*)    GFR calc non Af Amer 52 (*)    All other components within normal limits  CBC - Abnormal; Notable for the following components:   WBC 2.6 (*)    RBC 2.86 (*)    Hemoglobin 9.5 (*)    HCT 27.9 (*)    All other components within normal limits  LIPASE, BLOOD  URINALYSIS, COMPLETE (UACMP) WITH  MICROSCOPIC  TROPONIN I (HIGH SENSITIVITY)  TROPONIN I (HIGH SENSITIVITY)   ____________________________________________  EKG  ED ECG REPORT I, Hlee Fringer J, the attending physician, personally viewed and interpreted this ECG.   Date: 07/24/2019  EKG Time: 1957  Rate: 68  Rhythm: normal EKG, normal sinus rhythm  Axis: Normal  Intervals:none  ST&T Change: Nonspecific  ____________________________________________  RADIOLOGY  ED MD interpretation: Postoperative changes with adjacent ventral hernia without incarceration or obstruction  Official radiology report(s): CT Abdomen Pelvis W Contrast  Result Date: 07/25/2019 CLINICAL DATA:  Painful abdominal lump. History of mesh hernia repair 2 months ago. EXAM: CT ABDOMEN AND PELVIS WITH CONTRAST TECHNIQUE: Multidetector CT imaging of the abdomen and pelvis was performed using the standard protocol following bolus administration of intravenous contrast. CONTRAST:  140mL OMNIPAQUE IOHEXOL 300 MG/ML  SOLN COMPARISON:  January 08, 2013 FINDINGS: Lower chest: Very mild atelectasis is seen within the right lung base. Hepatobiliary: No focal liver abnormality is seen. No gallstones, gallbladder wall thickening, or biliary dilatation. Pancreas: Unremarkable. No pancreatic ductal dilatation or surrounding inflammatory changes. Spleen: Normal in size without focal abnormality. Adrenals/Urinary Tract: Adrenal glands are unremarkable. Kidneys are normal in size, without renal calculi or hydronephrosis. A 3.9 cm cyst is seen within the posteromedial aspect of the mid right kidney. A 5.4 cm cyst is seen in the posterior aspect of the mid left kidney. Bladder is unremarkable. Stomach/Bowel: Stomach is within normal limits. Appendix appears normal. No evidence of bowel wall thickening, distention, or inflammatory changes. Noninflamed diverticula are seen throughout the sigmoid colon. Vascular/Lymphatic: Mild to moderate severity aortic calcification. No enlarged  abdominal or pelvic lymph nodes. Reproductive: Prostate is unremarkable. Other: A surgical scar is seen involving the subcutaneous fat along the midline of the mid to upper abdomen. Surgical mesh is seen along the anterior aspect of the mid and upper abdomen. A 3.7 cm x 1.1 cm ventral hernia is seen along the midline, just below the level of the previously described surgical mesh. This contains nondilated loops of small bowel. Musculoskeletal: Moderate severity multilevel degenerative changes seen within the lumbar spine. IMPRESSION: 1. Postoperative changes with an adjacent 3.7 cm x 1.1 cm ventral hernia, as described above. 2. Sigmoid diverticulosis. 3. Bilateral renal cysts. Aortic Atherosclerosis (ICD10-I70.0). Electronically Signed  By: Aram Candela M.D.   On: 07/25/2019 00:40    ____________________________________________   PROCEDURES  Procedure(s) performed (including Critical Care):  Procedures   ____________________________________________   INITIAL IMPRESSION / ASSESSMENT AND PLAN / ED COURSE  As part of my medical decision making, I reviewed the following data within the electronic MEDICAL RECORD NUMBER Nursing notes reviewed and incorporated, Labs reviewed, EKG interpreted, Old chart reviewed, Radiograph reviewed and Notes from prior ED visits     Kevin Boyd was evaluated in Emergency Department on 07/25/2019 for the symptoms described in the history of present illness. He was evaluated in the context of the global COVID-19 pandemic, which necessitated consideration that the patient might be at risk for infection with the SARS-CoV-2 virus that causes COVID-19. Institutional protocols and algorithms that pertain to the evaluation of patients at risk for COVID-19 are in a state of rapid change based on information released by regulatory bodies including the CDC and federal and state organizations. These policies and algorithms were followed during the patient's care in the ED.      54 year old male presenting with abdominal pain, nausea and vomiting in the setting of ventral hernia repair several months ago. Differential diagnosis includes, but is not limited to, acute appendicitis, renal colic, testicular torsion, urinary tract infection/pyelonephritis, prostatitis,  epididymitis, diverticulitis, small bowel obstruction or ileus, colitis, abdominal aortic aneurysm, gastroenteritis, hernia, etc.  Laboratory results stable from blood work done at [facility] 07/13/2019.  Will administer IV analgesia/antiemetic and proceed with CT abdomen/pelvis.   Clinical Course as of Jul 24 412  Sat Jul 25, 2019  3016 Patient resting in no acute distress.  Pain improved after IV morphine.  Updated him on CT scan results.  There is no evidence of obstruction or incarceration of the ventral hernia.  Will provide patient with CD of his CT scan to take to his surgeon.  Strict return precautions given.  Patient verbalizes understanding agrees with plan of care.   [JS]    Clinical Course User Index [JS] Irean Hong, MD     ____________________________________________   FINAL CLINICAL IMPRESSION(S) / ED DIAGNOSES  Final diagnoses:  Generalized abdominal pain  Ventral hernia without obstruction or gangrene     ED Discharge Orders    None       Note:  This document was prepared using Dragon voice recognition software and may include unintentional dictation errors.   Irean Hong, MD 07/25/19 [phone]

## 2019-07-24 NOTE — ED Notes (Signed)
MD Monks at bedside  

## 2019-07-24 NOTE — ED Triage Notes (Addendum)
Patient states that he has a lump that keeps popping up on his abdomen and hurting. Patient states that it has been happening times one week.  Patient states that he had a mesh hernia repair about 2 months ago. Patient states that he took prescribed morhpine at home for it with no improvement.

## 2019-07-25 ENCOUNTER — Encounter: Payer: Self-pay | Admitting: Radiology

## 2019-07-25 ENCOUNTER — Emergency Department: Payer: BC Managed Care – PPO

## 2019-07-25 LAB — TROPONIN I (HIGH SENSITIVITY): Troponin I (High Sensitivity): 6 ng/L (ref <18)

## 2019-07-25 MED ORDER — IOHEXOL 300 MG/ML  SOLN
100.0000 mL | Freq: Once | INTRAMUSCULAR | Status: AC | PRN
  Administered 2019-07-25: 100 mL via INTRAVENOUS

## 2019-07-25 NOTE — Discharge Instructions (Addendum)
You may continue to take the Morphine and Zofran you have at home for pain and nausea.  Please call your surgeon Monday morning for follow-up appointment.  Take the disc of your CT provided to you to your appointment.  Return to the ER for worsening symptoms, persistent vomiting, difficulty breathing or other concerns.

## 2019-12-05 ENCOUNTER — Emergency Department
Admission: EM | Admit: 2019-12-05 | Discharge: 2019-12-05 | Disposition: A | Discharge status: Home / Self Care | Payer: BC Managed Care – PPO | Attending: Emergency Medicine | Admitting: Emergency Medicine

## 2019-12-05 ENCOUNTER — Other Ambulatory Visit: Payer: Self-pay

## 2019-12-05 ENCOUNTER — Encounter: Payer: Self-pay | Admitting: Emergency Medicine

## 2019-12-05 ENCOUNTER — Emergency Department: Payer: BC Managed Care – PPO

## 2019-12-05 DIAGNOSIS — W1830XA Fall on same level, unspecified, initial encounter: Secondary | ICD-10-CM | POA: Insufficient documentation

## 2019-12-05 DIAGNOSIS — M25572 Pain in left ankle and joints of left foot: Secondary | ICD-10-CM | POA: Insufficient documentation

## 2019-12-05 DIAGNOSIS — Y9301 Activity, walking, marching and hiking: Secondary | ICD-10-CM | POA: Diagnosis not present

## 2019-12-05 DIAGNOSIS — Z5321 Procedure and treatment not carried out due to patient leaving prior to being seen by health care provider: Secondary | ICD-10-CM | POA: Diagnosis not present

## 2019-12-05 DIAGNOSIS — Y929 Unspecified place or not applicable: Secondary | ICD-10-CM | POA: Insufficient documentation

## 2019-12-05 DIAGNOSIS — Y999 Unspecified external cause status: Secondary | ICD-10-CM | POA: Insufficient documentation

## 2019-12-05 DIAGNOSIS — K469 Unspecified abdominal hernia without obstruction or gangrene: Secondary | ICD-10-CM | POA: Diagnosis not present

## 2019-12-05 DIAGNOSIS — M545 Low back pain: Secondary | ICD-10-CM | POA: Insufficient documentation

## 2019-12-05 LAB — CBC
HCT: 30.5 % — ABNORMAL LOW (ref 39.0–52.0)
Hemoglobin: 10.8 g/dL — ABNORMAL LOW (ref 13.0–17.0)
MCH: 34.7 pg — ABNORMAL HIGH (ref 26.0–34.0)
MCHC: 35.4 g/dL (ref 30.0–36.0)
MCV: 98.1 fL (ref 80.0–100.0)
Platelets: 205 10*3/uL (ref 150–400)
RBC: 3.11 MIL/uL — ABNORMAL LOW (ref 4.22–5.81)
RDW: 12.7 % (ref 11.5–15.5)
WBC: 4.1 10*3/uL (ref 4.0–10.5)
nRBC: 0 % (ref 0.0–0.2)

## 2019-12-05 LAB — COMPREHENSIVE METABOLIC PANEL
ALT: 15 U/L (ref 0–44)
AST: 20 U/L (ref 15–41)
Albumin: 4.8 g/dL (ref 3.5–5.0)
Alkaline Phosphatase: 49 U/L (ref 38–126)
Anion gap: 9 (ref 5–15)
BUN: 27 mg/dL — ABNORMAL HIGH (ref 6–20)
CO2: 23 mmol/L (ref 22–32)
Calcium: 9.3 mg/dL (ref 8.9–10.3)
Chloride: 106 mmol/L (ref 98–111)
Creatinine, Ser: 1.69 mg/dL — ABNORMAL HIGH (ref 0.61–1.24)
GFR calc Af Amer: 53 mL/min — ABNORMAL LOW (ref >60)
GFR calc non Af Amer: 45 mL/min — ABNORMAL LOW (ref >60)
Glucose, Bld: 108 mg/dL — ABNORMAL HIGH (ref 70–99)
Potassium: 4.9 mmol/L (ref 3.5–5.1)
Sodium: 138 mmol/L (ref 135–145)
Total Bilirubin: 1.2 mg/dL (ref 0.3–1.2)
Total Protein: 7.9 g/dL (ref 6.5–8.1)

## 2019-12-05 LAB — LIPASE, BLOOD: Lipase: 24 U/L (ref 11–51)

## 2019-12-05 NOTE — ED Triage Notes (Addendum)
Pt fell last night walking out of house.  C/o pain to left ankle and lower back.  Also having pain to mid abdomen where hernia is.  Also having some groin pain; was told may have a new hernia here.   No fever. Ambulatory. VSS. Pt is s/p BOLT April 2019 and has had kidney problems r/t the transplant meds.

## 2021-01-26 ENCOUNTER — Encounter: Payer: Self-pay | Admitting: Ophthalmology

## 2021-01-26 ENCOUNTER — Other Ambulatory Visit: Payer: Self-pay

## 2021-02-06 NOTE — Discharge Instructions (Signed)

## 2021-02-12 NOTE — Anesthesia Preprocedure Evaluation (Addendum)
Anesthesia Evaluation  Patient identified by MRN, date of birth, ID band Patient awake    Reviewed: NPO status   History of Anesthesia Complications Negative for: history of anesthetic complications  Airway Mallampati: II  TM Distance: >3 FB Neck ROM: full    Dental  (+) Missing,    Pulmonary asthma (mild) , former smoker,  ILD/IPF  > BOLung Transplant : Apr 2019   Pulmonary exam normal        Cardiovascular Exercise Tolerance: Good hypertension, Normal cardiovascular exam     Neuro/Psych  Headaches, PSYCHIATRIC DISORDERS Bipolar Disorder    GI/Hepatic Neg liver ROS, GERD  Medicated,  Endo/Other  Morbid obesity (bmi 31)  Renal/GU Cr=1.9  negative genitourinary   Musculoskeletal   Abdominal   Peds  Hematology negative hematology ROS (+)   Anesthesia Other Findings "Jan 31, 2021: 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 Most recently Mr Cozzolino course has been c/b COVID-19 infection 05/17/20 s/p Remdesivir x 3. Unfortunately, he developed AMR and underwent the Pheresis/AMR protocol. He continues to do poorly and HLAs are still showing multiple Class II DSAs.  Feb 10, 2021: eVRP negative, covid and CMV pending. Wbc improved to 2.9, neutrophils improved to 1.3, noted Dr. Dorthula Rue and Leana Gamer, primary TC recently decreased Cellcept for prior wbc of 1.5. Noted pt has been sick and was instructed by primary TC to come for labs today and viral panels to follow up. CSA 91 despite d/c note on 114 that pt CSA was increased to 150 mg q 12 hrs.Creatinine up to 1.9 from 1.6 Called pt to check on him and discuss whether he had missed any recent doses of  cyclosporine with recent illness."  Feb 01, 2021: d/c  From hospital. Pt is in and out of the hospital every 4-5 months for lung transplant rejection issues.  Pt is currently optimized.  R/b/a/ d/w pt and surgeon.    No stress dose steroids needed.    Reproductive/Obstetrics                           Anesthesia Physical Anesthesia Plan  ASA: 4  Anesthesia Plan: MAC   Post-op Pain Management:    Induction:   PONV Risk Score and Plan: 1 and Midazolam  Airway Management Planned:   Additional Equipment:   Intra-op Plan:   Post-operative Plan:   Informed Consent: I have reviewed the patients History and Physical, chart, labs and discussed the procedure including the risks, benefits and alternatives for the proposed anesthesia with the patient or authorized representative who has indicated his/her understanding and acceptance.       Plan Discussed with: CRNA  Anesthesia Plan Comments:         Anesthesia Quick Evaluation

## 2021-02-13 ENCOUNTER — Ambulatory Visit: Payer: BC Managed Care – PPO | Admitting: Anesthesiology

## 2021-02-13 ENCOUNTER — Other Ambulatory Visit: Payer: Self-pay

## 2021-02-13 ENCOUNTER — Ambulatory Visit
Admission: RE | Admit: 2021-02-13 | Discharge: 2021-02-13 | Disposition: A | Discharge status: Home / Self Care | Payer: BC Managed Care – PPO | Attending: Ophthalmology | Admitting: Ophthalmology

## 2021-02-13 ENCOUNTER — Encounter
Admission: RE | Disposition: A | Discharge status: Home / Self Care | Payer: Self-pay | Source: Home / Self Care | Attending: Ophthalmology

## 2021-02-13 ENCOUNTER — Encounter: Payer: Self-pay | Admitting: Ophthalmology

## 2021-02-13 DIAGNOSIS — Z87891 Personal history of nicotine dependence: Secondary | ICD-10-CM | POA: Diagnosis not present

## 2021-02-13 DIAGNOSIS — Z942 Lung transplant status: Secondary | ICD-10-CM | POA: Diagnosis not present

## 2021-02-13 DIAGNOSIS — F319 Bipolar disorder, unspecified: Secondary | ICD-10-CM | POA: Insufficient documentation

## 2021-02-13 DIAGNOSIS — K219 Gastro-esophageal reflux disease without esophagitis: Secondary | ICD-10-CM | POA: Diagnosis not present

## 2021-02-13 DIAGNOSIS — Z6832 Body mass index (BMI) 32.0-32.9, adult: Secondary | ICD-10-CM | POA: Diagnosis not present

## 2021-02-13 DIAGNOSIS — I10 Essential (primary) hypertension: Secondary | ICD-10-CM | POA: Insufficient documentation

## 2021-02-13 DIAGNOSIS — H2512 Age-related nuclear cataract, left eye: Secondary | ICD-10-CM | POA: Insufficient documentation

## 2021-02-13 DIAGNOSIS — J45909 Unspecified asthma, uncomplicated: Secondary | ICD-10-CM | POA: Insufficient documentation

## 2021-02-13 HISTORY — PX: CATARACT EXTRACTION W/PHACO: SHX586

## 2021-02-13 SURGERY — PHACOEMULSIFICATION, CATARACT, WITH IOL INSERTION
Anesthesia: Monitor Anesthesia Care | Site: Eye | Laterality: Left

## 2021-02-13 MED ORDER — SIGHTPATH DOSE#1 BSS IO SOLN
INTRAOCULAR | Status: DC | PRN
  Administered 2021-02-13: 15 mL

## 2021-02-13 MED ORDER — SIGHTPATH DOSE#1 SODIUM HYALURONATE 23 MG/ML IO SOLUTION
PREFILLED_SYRINGE | INTRAOCULAR | Status: DC | PRN
  Administered 2021-02-13: 0.6 mL via INTRAOCULAR

## 2021-02-13 MED ORDER — ARMC OPHTHALMIC DILATING DROPS
1.0000 "application " | OPHTHALMIC | Status: DC | PRN
  Administered 2021-02-13 (×3): 1 via OPHTHALMIC

## 2021-02-13 MED ORDER — SIGHTPATH DOSE#1 BSS IO SOLN
INTRAOCULAR | Status: DC | PRN
  Administered 2021-02-13: 51 mL via OPHTHALMIC

## 2021-02-13 MED ORDER — TETRACAINE HCL 0.5 % OP SOLN
1.0000 [drp] | OPHTHALMIC | Status: DC | PRN
  Administered 2021-02-13 (×3): 1 [drp] via OPHTHALMIC

## 2021-02-13 MED ORDER — LACTATED RINGERS IV SOLN: INTRAVENOUS | Status: DC

## 2021-02-13 MED ORDER — MIDAZOLAM HCL 2 MG/2ML IJ SOLN
INTRAMUSCULAR | Status: DC | PRN
  Administered 2021-02-13: 1 mg via INTRAVENOUS

## 2021-02-13 MED ORDER — FENTANYL CITRATE (PF) 100 MCG/2ML IJ SOLN
INTRAMUSCULAR | Status: DC | PRN
  Administered 2021-02-13: 50 ug via INTRAVENOUS

## 2021-02-13 MED ORDER — SIGHTPATH DOSE#1 SODIUM HYALURONATE 10 MG/ML IO SOLUTION
PREFILLED_SYRINGE | INTRAOCULAR | Status: DC | PRN
  Administered 2021-02-13: 0.85 mL via INTRAOCULAR

## 2021-02-13 MED ORDER — LIDOCAINE HCL (PF) 2 % IJ SOLN
INTRAOCULAR | Status: DC | PRN
  Administered 2021-02-13: 1 mL via INTRAOCULAR

## 2021-02-13 MED ORDER — MOXIFLOXACIN HCL 0.5 % OP SOLN
OPHTHALMIC | Status: DC | PRN
  Administered 2021-02-13: 0.2 mL via OPHTHALMIC

## 2021-02-13 SURGICAL SUPPLY — 13 items
CANNULA ANT/CHMB 27GA (MISCELLANEOUS) ×2 IMPLANT
DISSECTOR HYDRO NUCLEUS 50X22 (MISCELLANEOUS) ×2 IMPLANT
GLOVE SURG GAMMEX PI TX LF 7.5 (GLOVE) ×2 IMPLANT
GLOVE SURG SYN 8.5  E (GLOVE) ×1
GLOVE SURG SYN 8.5 E (GLOVE) ×1 IMPLANT
GOWN STRL REUS W/ TWL LRG LVL3 (GOWN DISPOSABLE) ×2 IMPLANT
GOWN STRL REUS W/TWL LRG LVL3 (GOWN DISPOSABLE) ×4
LENS IOL TECNIS EYHANCE 18.0 (Intraocular Lens) ×2 IMPLANT
PACK EYE AFTER SURG (MISCELLANEOUS) ×2 IMPLANT
SYR 3ML LL SCALE MARK (SYRINGE) ×2 IMPLANT
SYR TB 1ML LUER SLIP (SYRINGE) ×2 IMPLANT
WATER STERILE IRR 250ML POUR (IV SOLUTION) ×2 IMPLANT
WIPE NON LINTING 3.25X3.25 (MISCELLANEOUS) ×2 IMPLANT

## 2021-02-13 NOTE — Transfer of Care (Signed)
Immediate Anesthesia Transfer of Care Note  Patient: Kevin Boyd  Procedure(s) Performed: CATARACT EXTRACTION PHACO AND INTRAOCULAR LENS PLACEMENT (IOC) LEFT (Left: Eye)  Patient Location: PACU  Anesthesia Type: MAC  Level of Consciousness: awake, alert  and patient cooperative  Airway and Oxygen Therapy: Patient Spontanous Breathing and Patient connected to supplemental oxygen  Post-op Assessment: Post-op Vital signs reviewed, Patient's Cardiovascular Status Stable, Respiratory Function Stable, Patent Airway and No signs of Nausea or vomiting  Post-op Vital Signs: Reviewed and stable  Complications: No notable events documented.

## 2021-02-13 NOTE — Anesthesia Postprocedure Evaluation (Signed)
Anesthesia Post Note  Patient: Kevin Boyd  Procedure(s) Performed: CATARACT EXTRACTION PHACO AND INTRAOCULAR LENS PLACEMENT (IOC) LEFT (Left: Eye)     Patient location during evaluation: PACU Anesthesia Type: MAC Level of consciousness: awake and alert Pain management: pain level controlled Vital Signs Assessment: post-procedure vital signs reviewed and stable Respiratory status: spontaneous breathing, nonlabored ventilation, respiratory function stable and patient connected to nasal cannula oxygen Cardiovascular status: stable and blood pressure returned to baseline Postop Assessment: no apparent nausea or vomiting Anesthetic complications: no   No notable events documented.  Fidel Levy

## 2021-02-13 NOTE — Op Note (Signed)
OPERATIVE NOTE  Anuel Sitter [id number] 02/13/2021   PREOPERATIVE DIAGNOSIS:  Nuclear sclerotic cataract left eye.  H25.12   POSTOPERATIVE DIAGNOSIS:    Nuclear sclerotic cataract left eye.     PROCEDURE:  Phacoemusification with posterior chamber intraocular lens placement of the left eye   LENS:   Implant Name Type Inv. Item Serial No. Manufacturer Lot No. LRB No. Used Action  LENS IOL TECNIS EYHANCE 18.0 - [id number] Intraocular Lens LENS IOL TECNIS EYHANCE 18.0 [id number] JOHNSON   Left 1 Implanted      Procedure(s) with comments: CATARACT EXTRACTION PHACO AND INTRAOCULAR LENS PLACEMENT (IOC) LEFT (Left) - 1.50 00:17.2  DIB00 +18.0   SURGEON:  Willey Blade, MD, MPH   ANESTHESIA:  Topical with tetracaine drops augmented with 1% preservative-free intracameral lidocaine.  ESTIMATED BLOOD LOSS: <1 mL   COMPLICATIONS:  None.   DESCRIPTION OF PROCEDURE:  The patient was identified in the holding room and transported to the operating room and placed in the supine position under the operating microscope.  The left eye was identified as the operative eye and it was prepped and draped in the usual sterile ophthalmic fashion.   A 1.0 millimeter clear-corneal paracentesis was made at the 5:00 position. 0.5 ml of preservative-free 1% lidocaine with epinephrine was injected into the anterior chamber.  The anterior chamber was filled with Healon 5 viscoelastic.  A 2.4 millimeter keratome was used to make a near-clear corneal incision at the 2:00 position.  A curvilinear capsulorrhexis was made with a cystotome and capsulorrhexis forceps.  Balanced salt solution was used to hydrodissect and hydrodelineate the nucleus.   Phacoemulsification was then used in stop and chop fashion to remove the lens nucleus and epinucleus.  The remaining cortex was then removed using the irrigation and aspiration handpiece. Healon was then placed into the capsular bag to distend it for lens placement.  A lens  was then injected into the capsular bag.  The remaining viscoelastic was aspirated.   Wounds were hydrated with balanced salt solution.  The anterior chamber was inflated to a physiologic pressure with balanced salt solution.  Intracameral vigamox 0.1 mL undiltued was injected into the eye and a drop placed onto the ocular surface.  No wound leaks were noted.  The patient was taken to the recovery room in stable condition without complications of anesthesia or surgery  Willey Blade 02/13/2021, 7:55 AM

## 2021-02-13 NOTE — H&P (Signed)
[facility]   Primary Care Physician:  Marisue Ivan, MD Ophthalmologist: Dr. Willey Blade  Pre-Procedure History & Physical: HPI:  Kevin Boyd is a 55 y.o. male here for cataract surgery.   Past Medical History:  Diagnosis Date   Asthma    Bipolar 1 disorder (HCC)    Borderline diabetes mellitus    C. difficile diarrhea    COPD (chronic obstructive pulmonary disease) (HCC)    GERD (gastroesophageal reflux disease)    Interstitial lung disease (HCC)    Scoliosis    Seasonal allergies    Sleep apnea     Past Surgical History:  Procedure Laterality Date   BACK SURGERY     lower lumbar fusion   LUNG SURGERY     biopsy   LUNG TRANSPLANT, DOUBLE     thumb surgery      Prior to Admission medications   Medication Sig Start Date End Date Taking? Authorizing Provider  albuterol (PROVENTIL HFA;VENTOLIN HFA) 108 (90 Base) MCG/ACT inhaler Inhale 2 puffs into the lungs every 6 (six) hours as needed for wheezing or shortness of breath. 09/18/16  Yes [name], MD  aspirin EC 81 MG tablet Take 1 tablet (81 mg total) by mouth daily. 11/04/14  Yes Shaune Pollack, MD  atorvastatin (LIPITOR) 40 MG tablet Take 1 tablet (40 mg total) by mouth daily at 6 PM. 11/04/14  Yes Shaune Pollack, MD  Bioflavonoid Products (ESTER C PO) Take 1 tablet by mouth daily.   Yes [provider]  carbamazepine (TEGRETOL XR) 100 MG 12 hr tablet Take 100-200 mg by mouth 2 (two) times daily. 2 tab qam, 2 tabs qhs   Yes [provider]  Cholecalciferol (VITAMIN D-3) 5000 UNITS TABS Take 1 tablet by mouth daily.   Yes [provider]  cycloSPORINE modified (NEORAL) 100 MG capsule Take 100 mg by mouth 2 (two) times daily.   Yes [provider]  doxepin (SINEQUAN) 10 MG capsule Take 5 mg by mouth at bedtime.   Yes [provider]  esomeprazole (NEXIUM) 40 MG capsule TAKE 1 CAPSULE BY MOUTH 2 TIMES DAILY BEFORE A MEAL 04/24/18  Yes [name], MD  fenofibrate  (TRICOR) 145 MG tablet Take 145 mg by mouth daily.   Yes [provider]  ferrous gluconate (FERGON) 324 MG tablet Take 324 mg by mouth every other day. At night   Yes [provider]  lamoTRIgine (LAMICTAL) 200 MG tablet Take 200 mg by mouth 2 (two) times daily.   Yes [provider]  loratadine (CLARITIN) 10 MG tablet Take 10 mg by mouth daily.   Yes [provider]  magnesium oxide (MAG-OX) 400 MG tablet Take 400 mg by mouth daily.   Yes [provider]  metoCLOPramide (REGLAN) 10 MG tablet Take 10 mg by mouth in the morning.   Yes [provider]  Multiple Vitamin (MULTIVITAMIN ADULT PO) Take by mouth.   Yes [provider]  mycophenolate (CELLCEPT) 500 MG tablet Take by mouth 2 (two) times daily.   Yes [provider]  ondansetron (ZOFRAN) 4 MG tablet Take 4 mg by mouth every 8 (eight) hours as needed for nausea or vomiting.   Yes [provider]  POSACONAZOLE PO Take 300 mg by mouth in the morning and at bedtime.   Yes [provider]  prazosin (MINIPRESS) 1 MG capsule Take 1 mg by mouth at bedtime.   Yes [provider]  predniSONE (DELTASONE) 20 MG  tablet Take 1 tablet (20 mg total) by mouth daily with breakfast. 05/30/17  Yes [name], MD  Probiotic Product (PROBIOTIC DAILY PO) Take 1 tablet by mouth daily.   Yes [provider]  rosuvastatin (CRESTOR) 5 MG tablet Take 5 mg by mouth daily.   Yes [provider]  sodium chloride HYPERTONIC 3 % nebulizer solution Take by nebulization 2 (two) times daily. 06/04/17  Yes [name], MD  sodium zirconium cyclosilicate (LOKELMA) 10 g PACK packet Take 10 g by mouth.   Yes [provider]  Sulfamethoxazole-Trimethoprim (BACTRIM PO) Take 80 mg by mouth.   Yes [provider]  valGANciclovir (VALCYTE) 450 MG tablet Take 450 mg by mouth daily.   Yes [provider]  albuterol (PROVENTIL) (2.5  MG/3ML) 0.083% nebulizer solution Take 3 mLs (2.5 mg total) by nebulization 2 (two) times daily. 05/30/17   [name], MD  amLODipine (NORVASC) 5 MG tablet Take 5 mg by mouth at bedtime.    [provider]  chlorpheniramine-HYDROcodone (TUSSIONEX PENNKINETIC ER) 10-8 MG/5ML SUER Take 5 mLs by mouth every 12 (twelve) hours as needed for cough. 05/30/17   [name], MD  docusate sodium (COLACE) 100 MG capsule Take 100 mg by mouth at bedtime. Patient not taking: Reported on 01/26/2021    [provider]  doxycycline (VIBRA-TABS) 100 MG tablet Take 1 tablet (100 mg total) by mouth 2 (two) times daily. Patient not taking: Reported on 01/26/2021 04/01/17   Bevelyn Ngo, NP  losartan (COZAAR) 50 MG tablet Take 50 mg by mouth daily.    [provider]  Respiratory Therapy Supplies (FLUTTER) DEVI Use as directed 03/17/15   [name], MD    Allergies as of 01/09/2021 - Review Complete 12/05/2019  Allergen Reaction Noted   Other Other (See Comments) 08/02/2017   Aspartame and phenylalanine  07/24/2019   Hydromorphone Other (See Comments) 05/10/2018   Prograf [tacrolimus] Other (See Comments) 12/23/2017   Sucralose  07/24/2019    Family History  Problem Relation Age of Onset   Pulmonary fibrosis Mother    Sjogren's syndrome Mother    Pulmonary fibrosis Maternal Grandmother     Social History   Socioeconomic History   Marital status: Married    Spouse name: Not on file   Number of children: Not on file   Years of education: Not on file   Highest education level: Not on file  Occupational History   Not on file  Tobacco Use   Smoking status: Former    Packs/day: 2.00    Years: 30.00    Pack years: 60.00    Types: Cigarettes    Quit date: 12/28/2013    Years since quitting: 7.1   Smokeless tobacco: Former   Tobacco comments:    2015  Substance and Sexual Activity   Alcohol use: Not Currently    Alcohol/week: 0.0 standard drinks    Drug use: Never   Sexual activity: Not on file  Other Topics Concern   Not on file  Social History Narrative   Lives at home by himself, Independent at baseline.   Social Determinants of Health   Financial Resource Strain: Not on file  Food Insecurity: Not on file  Transportation Needs: Not on file  Physical Activity: Not on file  Stress: Not on file  Social Connections: Not on file  Intimate Partner Violence: Not on file    Review of Systems: See HPI, otherwise negative ROS  Physical Exam: BP 130/79   Pulse 94   Temp 98.2 F (36.8 C) (Temporal)   Resp (!) 22   Ht 6\' 2"  (1.88 m)   Wt 111.1 kg   SpO2 99%   BMI 31.46 kg/m  General:   Alert, cooperative in NAD Head:  Normocephalic and atraumatic. Respiratory:  Normal work of breathing. Cardiovascular:  RRR  Impression/Plan: Kevin Boyd is here for cataract surgery.  Risks, benefits, limitations, and alternatives regarding cataract surgery have been reviewed with the patient.  Questions have been answered.  All parties agreeable.   Velora Mediate, MD  02/13/2021, 7:19 AM

## 2021-02-13 NOTE — Anesthesia Procedure Notes (Signed)
Procedure Name: MAC Date/Time: 02/13/2021 7:32 AM Performed by: Cameron Ali, CRNA Pre-anesthesia Checklist: Patient identified, Emergency Drugs available, Suction available, Timeout performed and Patient being monitored Patient Re-evaluated:Patient Re-evaluated prior to induction Oxygen Delivery Method: Nasal cannula Placement Confirmation: positive ETCO2

## 2021-02-14 ENCOUNTER — Encounter: Payer: Self-pay | Admitting: Ophthalmology

## 2021-02-20 ENCOUNTER — Encounter: Payer: Self-pay | Admitting: Ophthalmology

## 2021-02-22 NOTE — Discharge Instructions (Signed)

## 2021-02-27 ENCOUNTER — Encounter: Payer: Self-pay | Admitting: Ophthalmology

## 2021-02-27 ENCOUNTER — Encounter
Admission: RE | Disposition: A | Discharge status: Home / Self Care | Payer: Self-pay | Source: Home / Self Care | Attending: Ophthalmology

## 2021-02-27 ENCOUNTER — Other Ambulatory Visit: Payer: Self-pay

## 2021-02-27 ENCOUNTER — Ambulatory Visit
Admission: RE | Admit: 2021-02-27 | Discharge: 2021-02-27 | Disposition: A | Discharge status: Home / Self Care | Payer: BC Managed Care – PPO | Attending: Ophthalmology | Admitting: Ophthalmology

## 2021-02-27 ENCOUNTER — Ambulatory Visit: Payer: BC Managed Care – PPO | Admitting: Anesthesiology

## 2021-02-27 DIAGNOSIS — H2511 Age-related nuclear cataract, right eye: Secondary | ICD-10-CM | POA: Diagnosis not present

## 2021-02-27 DIAGNOSIS — E1136 Type 2 diabetes mellitus with diabetic cataract: Secondary | ICD-10-CM | POA: Insufficient documentation

## 2021-02-27 DIAGNOSIS — N183 Chronic kidney disease, stage 3 unspecified: Secondary | ICD-10-CM | POA: Diagnosis not present

## 2021-02-27 DIAGNOSIS — E1122 Type 2 diabetes mellitus with diabetic chronic kidney disease: Secondary | ICD-10-CM | POA: Diagnosis not present

## 2021-02-27 DIAGNOSIS — Z8616 Personal history of COVID-19: Secondary | ICD-10-CM | POA: Insufficient documentation

## 2021-02-27 DIAGNOSIS — I129 Hypertensive chronic kidney disease with stage 1 through stage 4 chronic kidney disease, or unspecified chronic kidney disease: Secondary | ICD-10-CM | POA: Insufficient documentation

## 2021-02-27 DIAGNOSIS — Z942 Lung transplant status: Secondary | ICD-10-CM | POA: Insufficient documentation

## 2021-02-27 DIAGNOSIS — I10 Essential (primary) hypertension: Secondary | ICD-10-CM | POA: Insufficient documentation

## 2021-02-27 DIAGNOSIS — E785 Hyperlipidemia, unspecified: Secondary | ICD-10-CM | POA: Insufficient documentation

## 2021-02-27 DIAGNOSIS — Z87891 Personal history of nicotine dependence: Secondary | ICD-10-CM | POA: Insufficient documentation

## 2021-02-27 DIAGNOSIS — Z6831 Body mass index (BMI) 31.0-31.9, adult: Secondary | ICD-10-CM | POA: Diagnosis not present

## 2021-02-27 HISTORY — PX: CATARACT EXTRACTION W/PHACO: SHX586

## 2021-02-27 SURGERY — PHACOEMULSIFICATION, CATARACT, WITH IOL INSERTION
Anesthesia: Monitor Anesthesia Care | Site: Eye | Laterality: Right

## 2021-02-27 MED ORDER — SIGHTPATH DOSE#1 BSS IO SOLN
INTRAOCULAR | Status: DC | PRN
  Administered 2021-02-27: 15 mL

## 2021-02-27 MED ORDER — ONDANSETRON HCL 4 MG/2ML IJ SOLN: 4.0000 mg | Freq: Once | INTRAMUSCULAR | Status: DC | PRN

## 2021-02-27 MED ORDER — ACETAMINOPHEN 160 MG/5ML PO SOLN: 325.0000 mg | ORAL | Status: DC | PRN

## 2021-02-27 MED ORDER — FENTANYL CITRATE (PF) 100 MCG/2ML IJ SOLN
INTRAMUSCULAR | Status: DC | PRN
  Administered 2021-02-27 (×2): 50 ug via INTRAVENOUS

## 2021-02-27 MED ORDER — MIDAZOLAM HCL 2 MG/2ML IJ SOLN
INTRAMUSCULAR | Status: DC | PRN
Start: 2021-02-27 — End: 2021-02-27
  Administered 2021-02-27 (×2): 1 mg via INTRAVENOUS

## 2021-02-27 MED ORDER — SIGHTPATH DOSE#1 BSS IO SOLN
INTRAOCULAR | Status: DC | PRN
  Administered 2021-02-27: 11:00:00 61 mL via OPHTHALMIC

## 2021-02-27 MED ORDER — ACETAMINOPHEN 325 MG PO TABS: 325.0000 mg | ORAL_TABLET | ORAL | Status: DC | PRN

## 2021-02-27 MED ORDER — SIGHTPATH DOSE#1 SODIUM HYALURONATE 23 MG/ML IO SOLUTION
PREFILLED_SYRINGE | INTRAOCULAR | Status: DC | PRN
  Administered 2021-02-27: 0.6 mL via INTRAOCULAR

## 2021-02-27 MED ORDER — TETRACAINE HCL 0.5 % OP SOLN
1.0000 [drp] | OPHTHALMIC | Status: DC | PRN
  Administered 2021-02-27 (×3): 1 [drp] via OPHTHALMIC

## 2021-02-27 MED ORDER — ARMC OPHTHALMIC DILATING DROPS
1.0000 "application " | OPHTHALMIC | Status: DC | PRN
  Administered 2021-02-27 (×3): 1 via OPHTHALMIC

## 2021-02-27 MED ORDER — MOXIFLOXACIN HCL 0.5 % OP SOLN
OPHTHALMIC | Status: DC | PRN
  Administered 2021-02-27: 0.2 mL via OPHTHALMIC

## 2021-02-27 MED ORDER — SIGHTPATH DOSE#1 SODIUM HYALURONATE 10 MG/ML IO SOLUTION
PREFILLED_SYRINGE | INTRAOCULAR | Status: DC | PRN
  Administered 2021-02-27: 0.85 mL via INTRAOCULAR

## 2021-02-27 MED ORDER — LIDOCAINE HCL (PF) 2 % IJ SOLN
INTRAOCULAR | Status: DC | PRN
  Administered 2021-02-27: 1 mL via INTRAOCULAR

## 2021-02-27 SURGICAL SUPPLY — 13 items
CANNULA ANT/CHMB 27GA (MISCELLANEOUS) ×2 IMPLANT
DISSECTOR HYDRO NUCLEUS 50X22 (MISCELLANEOUS) ×2 IMPLANT
GLOVE SURG GAMMEX PI TX LF 7.5 (GLOVE) ×2 IMPLANT
GLOVE SURG SYN 8.5  E (GLOVE) ×1
GLOVE SURG SYN 8.5 E (GLOVE) ×1 IMPLANT
GOWN STRL REUS W/ TWL LRG LVL3 (GOWN DISPOSABLE) ×2 IMPLANT
GOWN STRL REUS W/TWL LRG LVL3 (GOWN DISPOSABLE) ×4
LENS IOL TECNIS EYHANCE 18.5 (Intraocular Lens) ×2 IMPLANT
PACK EYE AFTER SURG (MISCELLANEOUS) ×2 IMPLANT
SYR 3ML LL SCALE MARK (SYRINGE) ×2 IMPLANT
SYR TB 1ML LUER SLIP (SYRINGE) ×2 IMPLANT
WATER STERILE IRR 250ML POUR (IV SOLUTION) ×2 IMPLANT
WIPE NON LINTING 3.25X3.25 (MISCELLANEOUS) ×2 IMPLANT

## 2021-02-27 NOTE — Op Note (Signed)
OPERATIVE NOTE  Kohler Pellerito [id number] 02/27/2021   PREOPERATIVE DIAGNOSIS:  Nuclear sclerotic cataract right eye.  H25.11   POSTOPERATIVE DIAGNOSIS:    Nuclear sclerotic cataract right eye.     PROCEDURE:  Phacoemusification with posterior chamber intraocular lens placement of the right eye   LENS:   Implant Name Type Inv. Item Serial No. Manufacturer Lot No. LRB No. Used Action  LENS IOL TECNIS EYHANCE 18.5 - [id number] Intraocular Lens LENS IOL TECNIS EYHANCE 18.5 [id number] JOHNSON   Right 1 Implanted       Procedure(s) with comments: CATARACT EXTRACTION PHACO AND INTRAOCULAR LENS PLACEMENT (IOC) RIGHT (Right) - 2.22 00:19.4  DIB00 +18.5   SURGEON:  Willey Blade, MD, MPH  ANESTHESIOLOGIST: Anesthesiologist: Jola Babinski, MD CRNA: Maree Krabbe, CRNA   ANESTHESIA:  Topical with tetracaine drops augmented with 1% preservative-free intracameral lidocaine.  ESTIMATED BLOOD LOSS: less than 1 mL.   COMPLICATIONS:  None.   DESCRIPTION OF PROCEDURE:  The patient was identified in the holding room and transported to the operating room and placed in the supine position under the operating microscope.  The right eye was identified as the operative eye and it was prepped and draped in the usual sterile ophthalmic fashion.   A 1.0 millimeter clear-corneal paracentesis was made at the 10:30 position. 0.5 ml of preservative-free 1% lidocaine with epinephrine was injected into the anterior chamber.  The anterior chamber was filled with Healon 5 viscoelastic.  A 2.4 millimeter keratome was used to make a near-clear corneal incision at the 8:00 position.  A curvilinear capsulorrhexis was made with a cystotome and capsulorrhexis forceps.  Balanced salt solution was used to hydrodissect and hydrodelineate the nucleus.   Phacoemulsification was then used in stop and chop fashion to remove the lens nucleus and epinucleus.  The remaining cortex was then removed using the irrigation and  aspiration handpiece. Healon was then placed into the capsular bag to distend it for lens placement.  A lens was then injected into the capsular bag.  The remaining viscoelastic was aspirated.   Wounds were hydrated with balanced salt solution.  The anterior chamber was inflated to a physiologic pressure with balanced salt solution.   Intracameral vigamox 0.1 mL undiluted was injected into the eye and a drop placed onto the ocular surface.  No wound leaks were noted.  The patient was taken to the recovery room in stable condition without complications of anesthesia or surgery  Willey Blade 02/27/2021, 11:12 AM

## 2021-02-27 NOTE — Anesthesia Preprocedure Evaluation (Signed)
Anesthesia Evaluation  Patient identified by MRN, date of birth, ID band Patient awake    Reviewed: Allergy & Precautions, NPO status   History of Anesthesia Complications Negative for: history of anesthetic complications  Airway Mallampati: II  TM Distance: >3 FB Neck ROM: full    Dental  (+) Missing,    Pulmonary asthma (mild) , former smoker,  ILD/IPF  > BOLung Transplant : Apr 2019   Pulmonary exam normal        Cardiovascular Exercise Tolerance: Good hypertension, Normal cardiovascular exam     Neuro/Psych  Headaches, PSYCHIATRIC DISORDERS Bipolar Disorder    GI/Hepatic GERD  Medicated,  Endo/Other  Morbid obesity (bmi 31)  Renal/GU Cr=1.9  negative genitourinary   Musculoskeletal   Abdominal   Peds  Hematology   Anesthesia Other Findings "Jan 31, 2021: 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 Most recently Kevin Boyd course has been c/b COVID-19 infection 05/17/20 s/p Remdesivir x 3. Unfortunately, he developed AMR and underwent the Pheresis/AMR protocol. He continues to do poorly and HLAs are still showing multiple Class II DSAs.  Feb 10, 2021: eVRP negative, covid and CMV pending. Wbc improved to 2.9, neutrophils improved to 1.3, noted Dr. Dorthula Rue and Leana Gamer, primary TC recently decreased Cellcept for prior wbc of 1.5. Noted pt has been sick and was instructed by primary TC to come for labs today and viral panels to follow up. CSA 91 despite d/c note on 114 that pt CSA was increased to 150 mg q 12 hrs.Creatinine up to 1.9 from 1.6 Called pt to check on him and discuss whether he had missed any recent doses of cyclosporine with recent  illness."  Feb 01, 2021: d/c  From hospital. Pt is in and out of the hospital every 4-5 months for lung transplant rejection issues.  Pt is currently optimized.  R/b/a/ d/w pt and surgeon.    No stress dose steroids needed.    Reproductive/Obstetrics                             Anesthesia Physical  Anesthesia Plan  ASA: 4  Anesthesia Plan: MAC   Post-op Pain Management:    Induction:   PONV Risk Score and Plan: 1 and Midazolam  Airway Management Planned:   Additional Equipment:   Intra-op Plan:   Post-operative Plan:   Informed Consent: I have reviewed the patients History and Physical, chart, labs and discussed the procedure including the risks, benefits and alternatives for the proposed anesthesia with the patient or authorized representative who has indicated his/her understanding and acceptance.       Plan Discussed with: CRNA  Anesthesia Plan Comments:         Anesthesia Quick Evaluation

## 2021-02-27 NOTE — Transfer of Care (Signed)
Immediate Anesthesia Transfer of Care Note  Patient: Kevin Boyd  Procedure(s) Performed: CATARACT EXTRACTION PHACO AND INTRAOCULAR LENS PLACEMENT (IOC) RIGHT (Right: Eye)  Patient Location: PACU  Anesthesia Type: MAC  Level of Consciousness: awake, alert  and patient cooperative  Airway and Oxygen Therapy: Patient Spontanous Breathing and Patient connected to supplemental oxygen  Post-op Assessment: Post-op Vital signs reviewed, Patient's Cardiovascular Status Stable, Respiratory Function Stable, Patent Airway and No signs of Nausea or vomiting  Post-op Vital Signs: Reviewed and stable  Complications: No notable events documented.

## 2021-02-27 NOTE — H&P (Signed)
[facility]   Primary Care Physician:  Marisue Ivan, MD Ophthalmologist: Dr. Willey Blade  Pre-Procedure History & Physical: HPI:  Kevin Boyd is a 55 y.o. male here for cataract surgery.   Past Medical History:  Diagnosis Date   Asthma    Bipolar 1 disorder (HCC)    Borderline diabetes mellitus    C. difficile diarrhea    COPD (chronic obstructive pulmonary disease) (HCC)    GERD (gastroesophageal reflux disease)    Interstitial lung disease (HCC)    Scoliosis    Seasonal allergies    Sleep apnea     Past Surgical History:  Procedure Laterality Date   BACK SURGERY     lower lumbar fusion   CATARACT EXTRACTION W/PHACO Left 02/13/2021   Procedure: CATARACT EXTRACTION PHACO AND INTRAOCULAR LENS PLACEMENT (IOC) LEFT;  Surgeon: Nevada Crane, MD;  Location: [facility];  Service: Ophthalmology;  Laterality: Left;  1.50 00:17.2   LUNG SURGERY     biopsy   LUNG TRANSPLANT, DOUBLE     thumb surgery      Prior to Admission medications   Medication Sig Start Date End Date Taking? Authorizing Provider  albuterol (PROVENTIL HFA;VENTOLIN HFA) 108 (90 Base) MCG/ACT inhaler Inhale 2 puffs into the lungs every 6 (six) hours as needed for wheezing or shortness of breath. 09/18/16  Yes [name], MD  albuterol (PROVENTIL) (2.5 MG/3ML) 0.083% nebulizer solution Take 3 mLs (2.5 mg total) by nebulization 2 (two) times daily. 05/30/17  Yes [name], MD  amLODipine (NORVASC) 5 MG tablet Take 5 mg by mouth at bedtime.   Yes [provider]  aspirin EC 81 MG tablet Take 1 tablet (81 mg total) by mouth daily. 11/04/14  Yes Shaune Pollack, MD  atorvastatin (LIPITOR) 40 MG tablet Take 1 tablet (40 mg total) by mouth daily at 6 PM. 11/04/14  Yes Shaune Pollack, MD  Bioflavonoid Products (ESTER C PO) Take 1 tablet by mouth daily.   Yes [provider]  Calcium Citrate-Vitamin D 315-5 MG-MCG TABS Take by mouth.   Yes [provider]   carbamazepine (TEGRETOL XR) 100 MG 12 hr tablet Take 100-200 mg by mouth 2 (two) times daily. 2 tab qam, 2 tabs qhs   Yes [provider]  chlorpheniramine-HYDROcodone (TUSSIONEX PENNKINETIC ER) 10-8 MG/5ML SUER Take 5 mLs by mouth every 12 (twelve) hours as needed for cough. 05/30/17  Yes [name], MD  Cholecalciferol (VITAMIN D-3) 5000 UNITS TABS Take 1 tablet by mouth daily.   Yes [provider]  cycloSPORINE modified (NEORAL) 100 MG capsule Take 100 mg by mouth 2 (two) times daily.   Yes [provider]  docusate sodium (COLACE) 100 MG capsule Take 100 mg by mouth at bedtime.   Yes [provider]  doxepin (SINEQUAN) 10 MG capsule Take 5 mg by mouth at bedtime.   Yes [provider]  doxycycline (VIBRA-TABS) 100 MG tablet Take 1 tablet (100 mg total) by mouth 2 (two) times daily. 04/01/17  Yes Bevelyn Ngo, NP  esomeprazole (NEXIUM) 40 MG capsule TAKE 1 CAPSULE BY MOUTH 2 TIMES DAILY BEFORE A MEAL 04/24/18  Yes [name], MD  fenofibrate (TRICOR) 145 MG tablet Take 145 mg by mouth daily.   Yes [provider]  ferrous gluconate (FERGON) 324 MG tablet Take 324 mg by mouth every other day. At night   Yes [provider]  lamoTRIgine (LAMICTAL) 200 MG tablet Take 200 mg  by mouth 2 (two) times daily.   Yes [provider]  loratadine (CLARITIN) 10 MG tablet Take 10 mg by mouth daily.   Yes [provider]  losartan (COZAAR) 50 MG tablet Take 50 mg by mouth daily.   Yes [provider]  magnesium oxide (MAG-OX) 400 MG tablet Take 400 mg by mouth daily.   Yes [provider]  metoCLOPramide (REGLAN) 10 MG tablet Take 10 mg by mouth in the morning.   Yes [provider]  Multiple Vitamin (MULTIVITAMIN ADULT PO) Take by mouth.   Yes [provider]  mycophenolate (CELLCEPT) 500 MG tablet Take 1,000 mg by mouth 2 (two) times daily.   Yes [provider]   ondansetron (ZOFRAN) 4 MG tablet Take 4 mg by mouth every 8 (eight) hours as needed for nausea or vomiting.   Yes [provider]  POSACONAZOLE PO Take 300 mg by mouth in the morning and at bedtime.   Yes [provider]  prazosin (MINIPRESS) 1 MG capsule Take 1 mg by mouth at bedtime.   Yes [provider]  predniSONE (DELTASONE) 20 MG tablet Take 1 tablet (20 mg total) by mouth daily with breakfast. Patient taking differently: Take 5 mg by mouth daily with breakfast. 05/30/17  Yes [name], MD  Probiotic Product (PROBIOTIC DAILY PO) Take 1 tablet by mouth daily.   Yes [provider]  Respiratory Therapy Supplies (FLUTTER) DEVI Use as directed 03/17/15  Yes [name], MD  rosuvastatin (CRESTOR) 5 MG tablet Take 5 mg by mouth daily.   Yes [provider]  sodium chloride HYPERTONIC 3 % nebulizer solution Take by nebulization 2 (two) times daily. 06/04/17  Yes [name], MD  sodium zirconium cyclosilicate (LOKELMA) 10 g PACK packet Take 10 g by mouth.   Yes [provider]  Sulfamethoxazole-Trimethoprim (BACTRIM PO) Take 80 mg by mouth.   Yes [provider]  valGANciclovir (VALCYTE) 450 MG tablet Take 450 mg by mouth daily.   Yes [provider]    Allergies as of 01/09/2021 - Review Complete 12/05/2019  Allergen Reaction Noted   Other Other (See Comments) 08/02/2017   Aspartame and phenylalanine  07/24/2019   Hydromorphone Other (See Comments) 05/10/2018   Prograf [tacrolimus] Other (See Comments) 12/23/2017   Sucralose  07/24/2019    Family History  Problem Relation Age of Onset   Pulmonary fibrosis Mother    Sjogren's syndrome Mother    Pulmonary fibrosis Maternal Grandmother     Social History   Socioeconomic History   Marital status: Married    Spouse name: Not on file   Number of children: Not on file   Years of education: Not on file   Highest education level: Not on file   Occupational History   Not on file  Tobacco Use   Smoking status: Former    Packs/day: 2.00    Years: 30.00    Pack years: 60.00    Types: Cigarettes    Quit date: 12/28/2013    Years since quitting: 7.1   Smokeless tobacco: Former   Tobacco comments:    2015  Vaping Use   Vaping Use: Never used  Substance and Sexual Activity   Alcohol use: Not Currently    Alcohol/week: 0.0 standard drinks   Drug use: Never   Sexual activity: Not on file  Other Topics Concern   Not on file  Social History Narrative   Lives at home by himself,  Independent at baseline.   Social Determinants of Health   Financial Resource Strain: Not on file  Food Insecurity: Not on file  Transportation Needs: Not on file  Physical Activity: Not on file  Stress: Not on file  Social Connections: Not on file  Intimate Partner Violence: Not on file    Review of Systems: See HPI, otherwise negative ROS  Physical Exam: BP (!) 126/97   Pulse 92   Temp (!) 97.2 F (36.2 C) (Temporal)   Ht 6\' 2"  (1.88 m)   Wt 110.2 kg   SpO2 96%   BMI 31.20 kg/m  General:   Alert, cooperative in NAD Head:  Normocephalic and atraumatic. Respiratory:  Normal work of breathing. Cardiovascular:  RRR  Impression/Plan: Kevin Boyd is here for cataract surgery.  Risks, benefits, limitations, and alternatives regarding cataract surgery have been reviewed with the patient.  Questions have been answered.  All parties agreeable.   Velora Mediate, MD  02/27/2021, 10:42 AM

## 2021-02-27 NOTE — Anesthesia Postprocedure Evaluation (Signed)
Anesthesia Post Note  Patient: Kevin Boyd  Procedure(s) Performed: CATARACT EXTRACTION PHACO AND INTRAOCULAR LENS PLACEMENT (IOC) RIGHT (Right: Eye)     Patient location during evaluation: PACU Anesthesia Type: MAC Level of consciousness: awake Pain management: pain level controlled Vital Signs Assessment: post-procedure vital signs reviewed and stable Respiratory status: respiratory function stable Cardiovascular status: stable Postop Assessment: no signs of nausea or vomiting Anesthetic complications: no   No notable events documented.  Veda Canning

## 2021-02-27 NOTE — Anesthesia Procedure Notes (Signed)
Procedure Name: MAC Date/Time: 02/27/2021 10:51 AM Performed by: Cameron Ali, CRNA Pre-anesthesia Checklist: Patient identified, Emergency Drugs available, Suction available, Timeout performed and Patient being monitored Patient Re-evaluated:Patient Re-evaluated prior to induction Oxygen Delivery Method: Nasal cannula Placement Confirmation: positive ETCO2

## 2021-02-28 ENCOUNTER — Encounter: Payer: Self-pay | Admitting: Ophthalmology

## 2021-04-17 ENCOUNTER — Telehealth: Payer: Self-pay | Admitting: Student

## 2021-04-17 NOTE — Telephone Encounter (Signed)
Attempted to reach patient @ [phone], to schedule Palliative Consult, no answer - left message with reason for call along with my name and call back number.  Also attempted to contact patient's wife at her work #, with no answer - left message requesting a return call to schedule visit.

## 2021-04-18 ENCOUNTER — Telehealth: Payer: Self-pay | Admitting: Student

## 2021-04-18 NOTE — Telephone Encounter (Signed)
Rec'd return call from patient and discussed the Palliative referral/services with him and all questions were answered and he was in agreement with beginning services with [place].  I have scheduled an In-home Consult for 04/20/21 @ 9 AM

## 2021-04-20 ENCOUNTER — Other Ambulatory Visit: Payer: Self-pay

## 2021-04-20 ENCOUNTER — Other Ambulatory Visit: Payer: BC Managed Care – PPO | Admitting: Student

## 2021-04-20 DIAGNOSIS — T86810 Lung transplant rejection: Secondary | ICD-10-CM

## 2021-04-20 DIAGNOSIS — F419 Anxiety disorder, unspecified: Secondary | ICD-10-CM

## 2021-04-20 DIAGNOSIS — R0602 Shortness of breath: Secondary | ICD-10-CM

## 2021-04-20 DIAGNOSIS — Z515 Encounter for palliative care: Secondary | ICD-10-CM

## 2021-04-20 NOTE — Progress Notes (Signed)
[facility] Consult Note Telephone: [phone]  Fax: [phone]   Date of encounter: 04/20/21 9:14 AM PATIENT NAME: Kevin Boyd [address] [address]   [phone] (home)  DOB: 1965/10/28 MRN: [id number] PRIMARY CARE PROVIDER:    Dion Body, MD,  [facility] [place] [address] [phone]  REFERRING PROVIDER:   Dr. Rodman Key Pipeling  RESPONSIBLE PARTY:    Contact Information     Name Relation Home Work Mobile   Kapena, Hamme [phone]        I met face to face with patient  in the home. Palliative Care was asked to follow this patient by consultation request of  Dr. Jolly Mango to address advance care planning and complex medical decision making. This is the initial visit.                                     ASSESSMENT AND PLAN / RECOMMENDATIONS:   Advance Care Planning/Goals of Care: Goals include to maximize quality of life and symptom management. Patient/health care surrogate gave his/her permission to discuss.Our advance care planning conversation included a discussion about:    The value and importance of advance care planning  Experiences with loved ones who have been seriously ill or have died  Exploration of personal, cultural or spiritual beliefs that might influence medical decisions  Exploration of goals of care in the event of a sudden injury or illness  Identification  of a healthcare agent  Review and updating or creation of an  advance directive document . Decision not to resuscitate or to de-escalate disease focused treatments due to poor prognosis. CODE STATUS: DNR  Education provided on palliative medicine versus hospice services.  We discussed frequency of visits, symptom management needs.  We will start palliative medicine at this time and will transition to hospice should patient continue to  decline further and have more frequent symptom management needs.  Patient is in agreement with current plan of care.  He would like to limit hospitalizations and be managed in the home if possible.     I spent 25 minutes providing this consultation. More than 50% of the time in this consultation was spent in counseling and care coordination. --------------------------------------------------------------------------------------  Symptom Management/Plan:  Bilateral Lung transplant 07/29/2017-patient with antibody mediated rejection of lung transplant; hx of idiopathic pulmonary fibrosis, respiratory failure. He is experiencing worsening shortness of breath and anxiety. He now has BiPAP; has used 2 nights now. Receiving empiric antibiotics. Continue Prevymis and prednisone as directed. Patient to follow up with pulmonology regarding Prevymis as he is having worsening headaches, eye pain, profuse sweating. He is to follow up with [facility] pul[facility]ology transplant on 04/28/21.  Shortness of breath-secondary to complications of bilateral lung transplant, lung transplant rejection, respiratory failure. Continue BiPAP QHS, start morphine 62m every 4 hours PRN shortness of breath, pain, restlessness.   Anxiety-will start lorazepam 0.544mevery 8 hours PRN anxiety and shortness of breath.   Follow up Palliative Care Visit: Palliative care will continue to follow for complex medical decision making, advance care planning, and clarification of goals. Return in 4 weeks or prn.   This visit was coded based on medical decision making (MDM).  PPS: 60%  HOSPICE ELIGIBILITY/DIAGNOSIS: TBD  Chief Complaint: Palliative Medicine initial visit.   HISTORY OF PRESENT ILLNESS:  RoAyyan Sitess a  y.o. year old male  with bilateral lung transplant with rejection; Idiopathic pulmonary fibrosis, OSA, shortness of breath, fatigue, bipolar 1 disorder, migraines, left hemiparesis, essential hypertension, hyperlipidemia. Patient  with worsening PFT's in the past year. Patient has stopped CellCept, stopped IVIG infusions. Patient recently started BiPAP. Patient last received Campath treatment on 03/23/21. He is also receiving empiric antibiotics. ° °Patient resides at home with wife.  He reports having increased shortness of breath and anxiety. He also reports increased cough; restarted Mucinex yesterday.  He denies pain except having occasional headaches recently.  He endorses a good appetite although he continues to lose weight. From 290, down to 239 in past 4-5 months. Sleeping around 11 hours a day, doesn't sleep well at night. Still able to complete adl's; occasionally drives.  Ambulates independently of assistive device or assistance.  Patient has been on Prevymis for around a month.  He is to contact his clinical coordinator as he is recently started having headaches, eye pain, profuse sweating, which may or may not be related to medication.  Patient worked as a general contractor. Patient has an [place] cruise in May that he is wanting to attend.  ° °Patient is observed becoming short of breath with ambulating around 10 feet in the home.  It takes a couple of minutes for his breathing to return to baseline after performing deep breathing techniques.  He is also noted to be short of breath with talking. ° °History obtained from review of EMR, discussion with primary team, and interview with family, facility staff/caregiver and/or Kevin Boyd.  °I reviewed available labs, medications, imaging, studies and related documents from the EMR.  Records reviewed and summarized above.  ° °ROS ° °General: NAD °EYES: denies vision changes °ENMT: denies dysphagia °Cardiovascular: denies chest pain, DOE °Pulmonary: cough, increased SOB °Abdomen: endorses good appetite, denies constipation, endorses continence of bowel °GU: denies dysuria, endorses continence of urine °MSK:  increased weakness,  no falls reported °Skin: denies rashes or  wounds °Neurological: denies pain °Psych: Endorses stable mood °Heme/lymph/immuno: denies bruises, abnormal bleeding ° °Physical Exam: °Pulse 78, resp 20, sats 97% on room air °Constitutional: NAD °General: frail appearing  °EYES: anicteric sclera, lids intact, no discharge  °ENMT: intact hearing, oral mucous membranes moist, dentition intact °CV: S1S2, RRR, no LE edema °Pulmonary: LCTA, increased work of breathing, non productive cough, room air °Abdomen:  normo-active BS + 4 quadrants, soft and non tender, no ascites °GU: deferred °MSK: moves all extremities, ambulatory °Skin: warm and dry, no rashes or wounds on visible skin °Neuro: generalized weakness,  no cognitive impairment °Psych: non-anxious affect, A and O x 3 °Hem/lymph/immuno: no widespread bruising °CURRENT PROBLEM LIST:  °Patient Active Problem List  ° Diagnosis Date Noted  ° OSA (obstructive sleep apnea) 10/12/2016  ° Hypertriglyceridemia 02/15/2016  ° Prediabetes 02/15/2016  ° Essential hypertension 02/15/2016  ° Left hemiparesis (HCC)   ° Hemiplegic migraine 02/13/2016  ° Cough 04/25/2015  ° CAP (community acquired pneumonia) 03/17/2015  ° Migraine 11/05/2014  ° Hyperlipidemia 11/04/2014  ° Migraine headache 11/03/2014  ° Borderline diabetes mellitus 11/03/2014  ° Bipolar 1 disorder (HCC) 11/03/2014  ° Fatigue 10/05/2014  ° GERD (gastroesophageal reflux disease) 06/22/2014  ° Chest pain 04/06/2014  ° Shortness of breath 03/04/2014  ° Solitary pulmonary nodule 03/04/2014  ° IPF (idiopathic pulmonary fibrosis) (HCC) 02/17/2014  ° °PAST MEDICAL HISTORY:  °Active Ambulatory Problems  °  Diagnosis Date Noted  ° IPF (idiopathic pulmonary fibrosis) (HCC) 02/17/2014  ° Shortness of breath   03/04/2014  ° Solitary pulmonary nodule 03/04/2014  ° Chest pain 04/06/2014  ° GERD (gastroesophageal reflux disease) 06/22/2014  ° Fatigue 10/05/2014  ° Migraine headache 11/03/2014  ° Borderline diabetes mellitus 11/03/2014  ° Bipolar 1 disorder (HCC) 11/03/2014  °  Hyperlipidemia 11/04/2014  ° Migraine 11/05/2014  ° CAP (community acquired pneumonia) 03/17/2015  ° Cough 04/25/2015  ° Hemiplegic migraine 02/13/2016  ° Left hemiparesis (HCC)   ° Hypertriglyceridemia 02/15/2016  ° Prediabetes 02/15/2016  ° Essential hypertension 02/15/2016  ° OSA (obstructive sleep apnea) 10/12/2016  ° °Resolved Ambulatory Problems  °  Diagnosis Date Noted  ° No Resolved Ambulatory Problems  ° °Past Medical History:  °Diagnosis Date  ° Asthma   ° C. difficile diarrhea   ° COPD (chronic obstructive pulmonary disease) (HCC)   ° Interstitial lung disease (HCC)   ° Scoliosis   ° Seasonal allergies   ° Sleep apnea   ° °SOCIAL HX:  °Social History  ° °Tobacco Use  ° Smoking status: Former  °  Packs/day: 2.00  °  Years: 30.00  °  Pack years: 60.00  °  Types: Cigarettes  °  Quit date: 12/28/2013  °  Years since quitting: 7.3  ° Smokeless tobacco: Former  ° Tobacco comments:  °  2015  °Substance Use Topics  ° Alcohol use: Not Currently  °  Alcohol/week: 0.0 standard drinks  ° °FAMILY HX:  °Family History  °Problem Relation Age of Onset  ° Pulmonary fibrosis Mother   ° Sjogren's syndrome Mother   ° Pulmonary fibrosis Maternal Grandmother   °   ° °ALLERGIES:  °Allergies  °Allergen Reactions  ° Other Other (See Comments)  °  Rat g °S/P BOLT  ° Aspartame And Phenylalanine   °  Seizures  ° Hydromorphone Other (See Comments)  °  Dilaudid   ° Prograf [Tacrolimus] Other (See Comments)  °  Loses vision  ° Sucralose   °  Seizures  °   °PERTINENT MEDICATIONS:  °Outpatient Encounter Medications as of 04/20/2021  °Medication Sig  ° albuterol (PROVENTIL HFA;VENTOLIN HFA) 108 (90 Base) MCG/ACT inhaler Inhale 2 puffs into the lungs every 6 (six) hours as needed for wheezing or shortness of breath.  ° albuterol (PROVENTIL) (2.5 MG/3ML) 0.083% nebulizer solution Take 3 mLs (2.5 mg total) by nebulization 2 (two) times daily.  ° amLODipine (NORVASC) 5 MG tablet Take 5 mg by mouth at bedtime.  ° aspirin EC 81 MG tablet Take 1  tablet (81 mg total) by mouth daily.  ° atorvastatin (LIPITOR) 40 MG tablet Take 1 tablet (40 mg total) by mouth daily at 6 PM.  ° Bioflavonoid Products (ESTER C PO) Take 1 tablet by mouth daily.  ° Calcium Citrate-Vitamin D 315-5 MG-MCG TABS Take by mouth.  ° carbamazepine (TEGRETOL XR) 100 MG 12 hr tablet Take 100-200 mg by mouth 2 (two) times daily. 2 tab qam, 2 tabs qhs  ° chlorpheniramine-HYDROcodone (TUSSIONEX PENNKINETIC ER) 10-8 MG/5ML SUER Take 5 mLs by mouth every 12 (twelve) hours as needed for cough.  ° Cholecalciferol (VITAMIN D-3) 5000 UNITS TABS Take 1 tablet by mouth daily.  ° cycloSPORINE modified (NEORAL) 100 MG capsule Take 100 mg by mouth 2 (two) times daily.  ° docusate sodium (COLACE) 100 MG capsule Take 100 mg by mouth at bedtime.  ° doxepin (SINEQUAN) 10 MG capsule Take 5 mg by mouth at bedtime.  ° doxycycline (VIBRA-TABS) 100 MG tablet Take 1 tablet (100 mg total) by mouth 2 (two) times daily.  °   esomeprazole (NEXIUM) 40 MG capsule TAKE 1 CAPSULE BY MOUTH 2 TIMES DAILY BEFORE A MEAL  ° fenofibrate (TRICOR) 145 MG tablet Take 145 mg by mouth daily.  ° ferrous gluconate (FERGON) 324 MG tablet Take 324 mg by mouth every other day. At night  ° lamoTRIgine (LAMICTAL) 200 MG tablet Take 200 mg by mouth 2 (two) times daily.  ° loratadine (CLARITIN) 10 MG tablet Take 10 mg by mouth daily.  ° losartan (COZAAR) 50 MG tablet Take 50 mg by mouth daily.  ° magnesium oxide (MAG-OX) 400 MG tablet Take 400 mg by mouth daily.  ° metoCLOPramide (REGLAN) 10 MG tablet Take 10 mg by mouth in the morning.  ° Multiple Vitamin (MULTIVITAMIN ADULT PO) Take by mouth.  ° mycophenolate (CELLCEPT) 500 MG tablet Take 1,000 mg by mouth 2 (two) times daily.  ° ondansetron (ZOFRAN) 4 MG tablet Take 4 mg by mouth every 8 (eight) hours as needed for nausea or vomiting.  ° POSACONAZOLE PO Take 300 mg by mouth in the morning and at bedtime.  ° prazosin (MINIPRESS) 1 MG capsule Take 1 mg by mouth at bedtime.  ° predniSONE  (DELTASONE) 20 MG tablet Take 1 tablet (20 mg total) by mouth daily with breakfast. (Patient taking differently: Take 5 mg by mouth daily with breakfast.)  ° Probiotic Product (PROBIOTIC DAILY PO) Take 1 tablet by mouth daily.  ° Respiratory Therapy Supplies (FLUTTER) DEVI Use as directed  ° rosuvastatin (CRESTOR) 5 MG tablet Take 5 mg by mouth daily.  ° sodium chloride HYPERTONIC 3 % nebulizer solution Take by nebulization 2 (two) times daily.  ° sodium zirconium cyclosilicate (LOKELMA) 10 g PACK packet Take 10 g by mouth.  ° Sulfamethoxazole-Trimethoprim (BACTRIM PO) Take 80 mg by mouth.  ° valGANciclovir (VALCYTE) 450 MG tablet Take 450 mg by mouth daily.  ° °No facility-administered encounter medications on file as of 04/20/2021.  ° °Thank you for the opportunity to participate in the care of Kevin Boyd.  The palliative care team will continue to follow. Please call our office at [phone] if we can be of additional assistance.  ° °LaToya S Rivers, NP  ° °COVID-19 PATIENT SCREENING TOOL °Asked and negative response unless otherwise noted: ° °Have you had symptoms of covid, tested positive or been in contact with someone with symptoms/positive test in the past 5-10 days? No ° °

## 2021-05-01 ENCOUNTER — Telehealth: Payer: Self-pay | Admitting: Student

## 2021-05-01 NOTE — Telephone Encounter (Signed)
Palliative NP spoke with patient's wife. She states patient has started morphine. It is helping his breathing, but feels it is making him sleepy. She also states patient over exerts himself and tires out easily, will not use rollator walker. He is still driving. He has used lorazepam a couple of times. Patient has stopped using the BiPap as he had some nausea and vomiting and is attributing this to the machine not being clean and making him sick. He missed his follow up appointment with [facility] pulmonology Transplant on 04/28/21. Patient is instructed to take morphine 2.5 mg and see if this helps with breathing, but not make him as sleepy. He is also encouraged to take lorazepam 0.5mg  (1/2) tablet for anxiety, shortness of breath. We discussed transitioning to hospice if his symptoms are worsening. Will see patient on Friday for follow up appointment. Wife is wanting to discuss his prognosis as she would like to take FMLA, but is unsure of when to take time off.

## 2021-05-05 ENCOUNTER — Other Ambulatory Visit: Payer: BC Managed Care – PPO | Admitting: Student

## 2021-05-05 ENCOUNTER — Other Ambulatory Visit: Payer: Self-pay

## 2021-05-05 DIAGNOSIS — F419 Anxiety disorder, unspecified: Secondary | ICD-10-CM

## 2021-05-05 DIAGNOSIS — R0602 Shortness of breath: Secondary | ICD-10-CM

## 2021-05-05 DIAGNOSIS — T86810 Lung transplant rejection: Secondary | ICD-10-CM

## 2021-05-05 DIAGNOSIS — Z515 Encounter for palliative care: Secondary | ICD-10-CM

## 2021-05-05 NOTE — Progress Notes (Signed)
[facility] Consult Note Telephone: [phone]  Fax: [phone]    Date of encounter: 05/05/21 9:38 AM PATIENT NAME: Kevin Boyd [address] [address]   [phone] (home)  DOB: Jun 07, 1965 MRN: [id number] PRIMARY CARE PROVIDER:    Dion Body, MD,  [facility] [place] [address] [phone]  REFERRING PROVIDER:   Dr. Rodman Key Boyd  RESPONSIBLE PARTY:    Contact Information     Name Relation Home Work Mobile   Kevin Boyd, Kevin Boyd [phone]        I met face to face with patient in the home. Palliative Care was asked to follow this patient by consultation request of Dr. Jolly Boyd  to address advance care planning and complex medical decision making. This is a follow up visit.                                   ASSESSMENT AND PLAN / RECOMMENDATIONS:   Advance Care Planning/Goals of Care: Goals include to maximize quality of life and symptom management. Patient/health care surrogate gave his/her permission to discuss. Our advance care planning conversation included a discussion about:    The value and importance of advance care planning  Experiences with loved ones who have been seriously ill or have died  Exploration of personal, cultural or spiritual beliefs that might influence medical decisions  Exploration of goals of care in the event of a sudden injury or illness CODE STATUS: DNR  Symptom Management/Plan:  Bilateral Lung transplant 07/29/2017-patient with antibody mediated rejection of lung transplant; hx of idiopathic pulmonary fibrosis, respiratory failure. Continue empiric antibiotics, prednisone and Prevymis as directed. He is to follow up with [facility] pulmonology transplant to day. We did discuss palliative vs. Hospice programs. He would be eligible for hospice services with BiPap, although in speaking with hospice  medical director, Prevymis would not be covered. Patient to speak with pulmonology regarding Prevymis.   Shortness of breath- patient reports morphine is helping with his shortness of breath, although making him feel groggy. Recommend cutting dose to 2.5 mg every 4 hours PRN to see if this helps and less groggy. Continue morphine for shortness of breath, pain, restlessness. Continue BiPAP as directed.  Anxiety- patient has started taking lorazepam 0.45m PRN. Recommend taking every night routinely to help with anxiety, worse at night. Continue lorazepam PRN. We did discuss possible options for treating his anxiety; checking on possible interactions with his Prevymis, Lamictal and tegretol.   Follow up Palliative Care Visit: Palliative care will continue to follow for complex medical decision making, advance care planning, and clarification of goals. Return 4 weeks or prn. Phone call f/u in 2 weeks.  This visit was coded based on medical decision making (MDM).  PPS: 60%  HOSPICE ELIGIBILITY/DIAGNOSIS: TBD  Chief Complaint: Palliative Medicine follow up visit; shortness of breath  HISTORY OF PRESENT ILLNESS:  RKenya Shiraishiis a 56y.o. year old male  with bilateral lung transplant with rejection; Idiopathic pulmonary fibrosis, OSA, shortness of breath, fatigue, bipolar 1 disorder, migraines, left hemiparesis, essential hypertension, hyperlipidemia. Patient with worsening PFT's in the past year. Patient has stopped CellCept, stopped IVIG infusions. Patient recently started BiPAP. Patient last received Campath treatment on 03/23/21. He is also receiving empiric antibiotics.   Reports shortness of breath improving with morphine, although it is making him groggy. He reports having panic  attacks. He states his anxiety is worse at night. He is able to do deep breathing techniques to help calm. He continues to sleep around 11-12 hours a day; up intermittently at night. No further functional decline. He  missed follow up appointment with [facility]monology transplant last week to not feeling well. He attributed symptoms to BiPAP and stopped using. He is feeling better and has restarted using BiPAP. A 10-point ROS is negative, except for the pertinent positive and negatives detailed in the HPI.  History obtained from review of EMR, discussion with primary team, and interview with family, facility staff/caregiver and/or Kevin Boyd.  I reviewed available labs, medications, imaging, studies and related documents from the EMR.  Records reviewed and summarized above.    Physical Exam: Pulse 85, resp , b/p 116/78, sats 96% on room air Constitutional: NAD General: frail appearing  EYES: anicteric sclera, lids intact, no discharge  ENMT: intact hearing, oral mucous membranes moist, dentition intact CV: S1S2, RRR, no LE edema Pulmonary: LCTA, slightly diminished, increased work of breathing with exertion, cough, room air Abdomen: normo-active BS + 4 quadrants, soft and non tender, no ascites GU: deferred MSK: moves all extremities, ambulatory Skin: warm and dry, no rashes or wounds on visible skin Neuro: generalized weakness,  no cognitive impairment Psych: non-anxious affect during visit, A and O x 3 Hem/lymph/immuno: no widespread bruising   Thank you for the opportunity to participate in the care of Kevin Boyd.  The palliative care team will continue to follow. Please call our office at [phone] if we can be of additional assistance.   Kevin Slocumb, NP   COVID-19 PATIENT SCREENING TOOL Asked and negative response unless otherwise noted:   Have you had symptoms of covid, tested positive or been in contact with someone with symptoms/positive test in the past 5-10 days? No

## 2021-05-15 ENCOUNTER — Telehealth: Payer: Self-pay | Admitting: Student

## 2021-05-15 NOTE — Telephone Encounter (Signed)
Palliative NP returned call to patient. He is needing refills soon on his lorazepam and morphine. Both medications have been helpful with his breathing and anxiety. He is still having increased anxiety at bedtime. We discussed increasing the dosage of his lorazepam. New scripts sent to pharmacy. Will f/u on Friday.

## 2021-05-26 NOTE — Progress Notes (Signed)
X °

## 2021-05-29 ENCOUNTER — Telehealth: Payer: Self-pay | Admitting: Student

## 2021-05-29 NOTE — Telephone Encounter (Signed)
Palliative NP returned call to patient. He reports worsening depression and anxiety. F/u telehealh visit scheduled for 05/30/21 at 915am.

## 2021-05-30 ENCOUNTER — Other Ambulatory Visit: Payer: BC Managed Care – PPO | Admitting: Student

## 2021-05-30 ENCOUNTER — Other Ambulatory Visit: Payer: Self-pay

## 2021-05-30 DIAGNOSIS — T86810 Lung transplant rejection: Secondary | ICD-10-CM

## 2021-05-30 DIAGNOSIS — Z515 Encounter for palliative care: Secondary | ICD-10-CM

## 2021-05-30 DIAGNOSIS — F339 Major depressive disorder, recurrent, unspecified: Secondary | ICD-10-CM

## 2021-05-30 DIAGNOSIS — F419 Anxiety disorder, unspecified: Secondary | ICD-10-CM

## 2021-05-30 DIAGNOSIS — R0602 Shortness of breath: Secondary | ICD-10-CM

## 2021-05-30 NOTE — Progress Notes (Signed)
Therapist, nutritional Palliative Care Consult Note Telephone: [phone]  Fax: [phone]    Date of encounter: 05/30/21 9:19 AM PATIENT NAME: Kevin Boyd [address] [place] [address]   [phone] (home)  DOB: 11-12-1965 MRN: [id number] PRIMARY CARE PROVIDER:    Marisue Ivan, MD,  [address] [facility] [address] [facility] [place] [address] [phone]  REFERRING PROVIDER:   Marisue Ivan, MD [phone] [facility] [address] [facility] [place],  [place] [address] [phone]  RESPONSIBLE PARTY:    Contact Information     Name Relation Home Work Mobile   Kevin Boyd, Kevin Boyd [phone]      Due to the COVID-19 crisis, this home visit was done via telephone due to the patient's inability to connect via an audiovisual connection or their refusal to have an in-person visit. This connection was agreed to by the patient. Verified that I am speaking with the correct person using two identifiers. I discussed the limitations of evaluation and management by telemedicine. The patient expressed understanding and agreed to proceed.                                    ASSESSMENT AND PLAN / RECOMMENDATIONS:   Advance Care Planning/Goals of Care: Goals include to maximize quality of life and symptom management. Patient/health care surrogate gave his/her permission to discuss. Our advance care planning conversation included a discussion about:    The value and importance of advance care planning  Experiences with loved ones who have been seriously ill or have died  Exploration of personal, cultural or spiritual beliefs that might influence medical decisions  Exploration of goals of care in the event of a sudden injury or illness  CODE STATUS: DNR  Education provided on palliative medicine vs. Hospice services. Discussed patient needing more symptom management. Will discuss with medical director regarding  hospice evaluation. Palliative medicine will continue to provide symptom management.   Symptom Management/Plan:  Bilateral Lung transplant 07/29/2017-patient with antibody mediated rejection of lung transplant; hx of idiopathic pulmonary fibrosis, respiratory failure. He states his shortness of breath is managed well with the morphine. He is experiencing worsening depression and anxiety. He is to continue his Prevymis and prednisone as directed; continue empiric antibiotics. He is to follow up with [facility] pulmonology transplant as scheduled.   Shortness of breath-secondary to complications of bilateral lung transplant, lung transplant rejection, respiratory failure. Continue BiPAP QHS as able to tolerate. Continue morphine 5mg  every 4 hours PRN shortness of breath, pain, restlessness, lorazepam 1 mg every 8 hours PRN.  Anxiety and depression-worsening anxiety and depression. Will continue his lorazepam 1 mg every 8 hours PRN. Start Lexapro 10 mg daily; education provided on medication. He is to continue his Lamictal and tegretol as directed. Referral made to palliative SW for pre-bereavement counseling.    Follow up Palliative Care Visit: Palliative care will continue to follow for complex medical decision making, advance care planning, and clarification of goals. Return in 2-3 weeks or prn.   This visit was coded based on medical decision making (MDM).  PPS: 60%  HOSPICE ELIGIBILITY/DIAGNOSIS: TBD  Chief Complaint: Palliative Medicine follow up; worsening anxiety and depression.  HISTORY OF PRESENT ILLNESS:  Kevin Boyd is a 56 y.o. year old male  with bilateral lung transplant with rejection; Idiopathic pulmonary fibrosis, OSA, shortness of breath, fatigue, bipolar 1 disorder, migraines, left hemiparesis, essential hypertension, hyperlipidemia. Patient  with worsening PFT's in the past year. Patient has stopped CellCept, stopped IVIG infusions. Patient recently started BiPAP. Patient last  received Campath treatment on 03/23/21. He is also receiving empiric antibiotics.   Patient endorses worsening anxiety and depression. He states he has been more tearful, emotional. He is thinking more about everything that is going on, his decline. He is taking the lorazepam 2-3 times a day PRN. He is taking PRN morphine 5 mg 3-4 times a day. He states his breathing is better when taking morphine. Endorses a poor appetite, but his wife will make him eat. He is using the BiPAP may 5 nights a week. He expresses a fear of getting pneumonia from using his BiPAP. He has a productive cough; no worsening in cough. He is going out of the house rarely. He did have a fall last night; no apparent injury. He states it was because he lost his balance. Using his walker more. A 10-point ROS is negative, except for the pertinent positives and negatives detailed per the HPI.   History obtained from review of EMR, discussion with primary team, and interview with family, facility staff/caregiver and/or Mr. Haughey.  I reviewed available labs, medications, imaging, studies and related documents from the EMR.  Records reviewed and summarized above.   Physical Exam:  Deferred due to this being a tele medicine visit.    Thank you for the opportunity to participate in the care of Mr. Manuele.  The palliative care team will continue to follow. Please call our office at [phone] if we can be of additional assistance.   Luella Cook, NP   COVID-19 PATIENT SCREENING TOOL Asked and negative response unless otherwise noted:   Have you had symptoms of covid, tested positive or been in contact with someone with symptoms/positive test in the past 5-10 days? No

## 2021-05-30 NOTE — Progress Notes (Signed)
PC SW outreached patient, per [facility] NP- L. Rivers SW referral request to assess MH needs and counseling services.  Patient endorses worsening anxiety and depression and is open to counseling services. He states he has been more tearful, emotional. He is thinking more about everything that is going on, his decline and is having a hard time with coping with his terminal illness, with recent bilateral lung transplant rejection, along with the idea of passing/EOL. He has a poor appetite and is no longer going out to socialize. Patient is hospice appropriate however medication approval is an issue. PHQ- 9 is 14/27. He indicates phone calls are better for him right now.   SW proivded emotional support during call. Sw placed grief counseling (pre-bereavement)referral to [facility] hospice grief counseling department.

## 2021-06-12 ENCOUNTER — Telehealth: Payer: Self-pay | Admitting: Student

## 2021-06-12 NOTE — Telephone Encounter (Signed)
Palliative NP spoke with patient to make him aware hospice referral intake has been trying to reach him to to schedule admission visit. He is also given direct number to reach referral intake specialist.  ?

## 2021-09-30 DEATH — deceased
